# Patient Record
Sex: Female | Born: 1935 | Race: White | Hispanic: No | Marital: Married | Smoking: Never smoker
Health system: Southern US, Community
[De-identification: ages and names within clinical notes are randomized; demographics above are authoritative.]

## PROBLEM LIST (undated history)

## (undated) DIAGNOSIS — R0600 Dyspnea, unspecified: Secondary | ICD-10-CM

## (undated) DIAGNOSIS — M978XXA Periprosthetic fracture around other internal prosthetic joint, initial encounter: Secondary | ICD-10-CM

## (undated) DIAGNOSIS — Z950 Presence of cardiac pacemaker: Secondary | ICD-10-CM

## (undated) DIAGNOSIS — I351 Nonrheumatic aortic (valve) insufficiency: Secondary | ICD-10-CM

## (undated) DIAGNOSIS — S72141A Displaced intertrochanteric fracture of right femur, initial encounter for closed fracture: Secondary | ICD-10-CM

## (undated) DIAGNOSIS — E785 Hyperlipidemia, unspecified: Secondary | ICD-10-CM

## (undated) DIAGNOSIS — F419 Anxiety disorder, unspecified: Secondary | ICD-10-CM

## (undated) DIAGNOSIS — I059 Rheumatic mitral valve disease, unspecified: Secondary | ICD-10-CM

## (undated) DIAGNOSIS — I447 Left bundle-branch block, unspecified: Secondary | ICD-10-CM

## (undated) DIAGNOSIS — I2510 Atherosclerotic heart disease of native coronary artery without angina pectoris: Secondary | ICD-10-CM

## (undated) DIAGNOSIS — Y92009 Unspecified place in unspecified non-institutional (private) residence as the place of occurrence of the external cause: Secondary | ICD-10-CM

## (undated) DIAGNOSIS — F329 Major depressive disorder, single episode, unspecified: Secondary | ICD-10-CM

## (undated) DIAGNOSIS — I714 Abdominal aortic aneurysm, without rupture, unspecified: Secondary | ICD-10-CM

## (undated) DIAGNOSIS — I509 Heart failure, unspecified: Secondary | ICD-10-CM

## (undated) DIAGNOSIS — I219 Acute myocardial infarction, unspecified: Secondary | ICD-10-CM

## (undated) DIAGNOSIS — W19XXXA Unspecified fall, initial encounter: Secondary | ICD-10-CM

## (undated) DIAGNOSIS — F32A Depression, unspecified: Secondary | ICD-10-CM

## (undated) DIAGNOSIS — Z96659 Presence of unspecified artificial knee joint: Secondary | ICD-10-CM

## (undated) HISTORY — DX: Presence of cardiac pacemaker: Z95.0

## (undated) HISTORY — PX: ABDOMINAL AORTIC ANEURYSM REPAIR: SUR1152

## (undated) HISTORY — PX: OTHER SURGICAL HISTORY: SHX169

## (undated) HISTORY — DX: Heart failure, unspecified: I50.9

## (undated) HISTORY — DX: Depression, unspecified: F32.A

## (undated) HISTORY — DX: Nonrheumatic aortic (valve) insufficiency: I35.1

## (undated) HISTORY — DX: Rheumatic mitral valve disease, unspecified: I05.9

## (undated) HISTORY — DX: Atherosclerotic heart disease of native coronary artery without angina pectoris: I25.10

## (undated) HISTORY — PX: HERNIA REPAIR: SHX51

## (undated) HISTORY — DX: Acute myocardial infarction, unspecified: I21.9

## (undated) HISTORY — PX: JOINT REPLACEMENT: SHX530

## (undated) HISTORY — DX: Hyperlipidemia, unspecified: E78.5

## (undated) HISTORY — DX: Major depressive disorder, single episode, unspecified: F32.9

## (undated) HISTORY — DX: Left bundle-branch block, unspecified: I44.7

## (undated) HISTORY — DX: Unspecified place in unspecified non-institutional (private) residence as the place of occurrence of the external cause: Y92.009

## (undated) HISTORY — DX: Dyspnea, unspecified: R06.00

## (undated) HISTORY — DX: Unspecified fall, initial encounter: W19.XXXA

## (undated) HISTORY — DX: Anxiety disorder, unspecified: F41.9

---

## 2001-06-15 ENCOUNTER — Emergency Department (HOSPITAL_COMMUNITY): Admission: EM | Admit: 2001-06-15 | Discharge: 2001-06-15 | Payer: Self-pay | Admitting: Emergency Medicine

## 2001-06-21 ENCOUNTER — Emergency Department (HOSPITAL_COMMUNITY): Admission: EM | Admit: 2001-06-21 | Discharge: 2001-06-21 | Payer: Self-pay

## 2002-04-19 ENCOUNTER — Other Ambulatory Visit: Admission: RE | Admit: 2002-04-19 | Discharge: 2002-04-19 | Payer: Self-pay | Admitting: Internal Medicine

## 2002-05-05 ENCOUNTER — Encounter: Admission: RE | Admit: 2002-05-05 | Discharge: 2002-05-05 | Payer: Self-pay | Admitting: *Deleted

## 2002-05-05 ENCOUNTER — Encounter: Payer: Self-pay | Admitting: *Deleted

## 2002-09-09 ENCOUNTER — Emergency Department (HOSPITAL_COMMUNITY): Admission: EM | Admit: 2002-09-09 | Discharge: 2002-09-09 | Payer: Self-pay | Admitting: Emergency Medicine

## 2002-09-09 ENCOUNTER — Encounter: Payer: Self-pay | Admitting: Emergency Medicine

## 2002-09-11 ENCOUNTER — Inpatient Hospital Stay (HOSPITAL_COMMUNITY): Admission: EM | Admit: 2002-09-11 | Discharge: 2002-09-13 | Payer: Self-pay | Admitting: Emergency Medicine

## 2002-09-11 ENCOUNTER — Encounter: Payer: Self-pay | Admitting: Emergency Medicine

## 2002-09-12 ENCOUNTER — Encounter: Payer: Self-pay | Admitting: Geriatric Medicine

## 2002-12-30 ENCOUNTER — Ambulatory Visit (HOSPITAL_COMMUNITY): Admission: RE | Admit: 2002-12-30 | Discharge: 2002-12-30 | Payer: Self-pay | Admitting: Gastroenterology

## 2003-02-12 ENCOUNTER — Emergency Department (HOSPITAL_COMMUNITY): Admission: EM | Admit: 2003-02-12 | Discharge: 2003-02-12 | Payer: Self-pay | Admitting: Emergency Medicine

## 2003-02-12 ENCOUNTER — Encounter: Payer: Self-pay | Admitting: Emergency Medicine

## 2003-03-07 ENCOUNTER — Encounter: Payer: Self-pay | Admitting: Internal Medicine

## 2003-03-07 ENCOUNTER — Encounter: Admission: RE | Admit: 2003-03-07 | Discharge: 2003-03-07 | Payer: Self-pay | Admitting: Internal Medicine

## 2003-06-13 ENCOUNTER — Other Ambulatory Visit: Admission: RE | Admit: 2003-06-13 | Discharge: 2003-06-13 | Payer: Self-pay | Admitting: Internal Medicine

## 2003-07-21 ENCOUNTER — Encounter: Admission: RE | Admit: 2003-07-21 | Discharge: 2003-07-21 | Payer: Self-pay | Admitting: Internal Medicine

## 2003-08-07 ENCOUNTER — Other Ambulatory Visit: Admission: RE | Admit: 2003-08-07 | Discharge: 2003-08-07 | Payer: Self-pay | Admitting: Obstetrics and Gynecology

## 2004-01-10 ENCOUNTER — Other Ambulatory Visit: Admission: RE | Admit: 2004-01-10 | Discharge: 2004-01-10 | Payer: Self-pay | Admitting: Obstetrics and Gynecology

## 2004-07-01 ENCOUNTER — Other Ambulatory Visit: Admission: RE | Admit: 2004-07-01 | Discharge: 2004-07-01 | Payer: Self-pay | Admitting: Obstetrics and Gynecology

## 2004-07-22 ENCOUNTER — Encounter: Admission: RE | Admit: 2004-07-22 | Discharge: 2004-07-22 | Payer: Self-pay | Admitting: Internal Medicine

## 2004-08-25 HISTORY — PX: PACEMAKER INSERTION: SHX728

## 2005-07-03 ENCOUNTER — Other Ambulatory Visit: Admission: RE | Admit: 2005-07-03 | Discharge: 2005-07-03 | Payer: Self-pay | Admitting: Obstetrics and Gynecology

## 2005-07-21 ENCOUNTER — Encounter: Admission: RE | Admit: 2005-07-21 | Discharge: 2005-09-15 | Payer: Self-pay | Admitting: Orthopedic Surgery

## 2005-07-30 ENCOUNTER — Encounter: Admission: RE | Admit: 2005-07-30 | Discharge: 2005-07-30 | Payer: Self-pay | Admitting: Internal Medicine

## 2005-12-08 ENCOUNTER — Other Ambulatory Visit: Admission: RE | Admit: 2005-12-08 | Discharge: 2005-12-08 | Payer: Self-pay | Admitting: Obstetrics and Gynecology

## 2006-06-02 ENCOUNTER — Other Ambulatory Visit: Admission: RE | Admit: 2006-06-02 | Discharge: 2006-06-02 | Payer: Self-pay | Admitting: Obstetrics and Gynecology

## 2006-06-12 ENCOUNTER — Encounter: Admission: RE | Admit: 2006-06-12 | Discharge: 2006-06-12 | Payer: Self-pay | Admitting: Internal Medicine

## 2006-08-04 ENCOUNTER — Encounter: Admission: RE | Admit: 2006-08-04 | Discharge: 2006-08-04 | Payer: Self-pay | Admitting: Internal Medicine

## 2006-08-25 HISTORY — PX: REPLACEMENT TOTAL KNEE: SUR1224

## 2006-09-23 ENCOUNTER — Ambulatory Visit: Admission: RE | Admit: 2006-09-23 | Discharge: 2006-09-23 | Payer: Self-pay | Admitting: Cardiology

## 2006-09-23 ENCOUNTER — Encounter (INDEPENDENT_AMBULATORY_CARE_PROVIDER_SITE_OTHER): Payer: Self-pay | Admitting: Cardiology

## 2007-01-04 ENCOUNTER — Inpatient Hospital Stay (HOSPITAL_COMMUNITY): Admission: RE | Admit: 2007-01-04 | Discharge: 2007-01-07 | Payer: Self-pay | Admitting: Orthopedic Surgery

## 2007-01-04 ENCOUNTER — Ambulatory Visit: Payer: Self-pay | Admitting: Physical Medicine & Rehabilitation

## 2007-04-20 ENCOUNTER — Other Ambulatory Visit: Admission: RE | Admit: 2007-04-20 | Discharge: 2007-04-20 | Payer: Self-pay | Admitting: Obstetrics and Gynecology

## 2007-08-10 ENCOUNTER — Encounter: Admission: RE | Admit: 2007-08-10 | Discharge: 2007-08-10 | Payer: Self-pay | Admitting: Internal Medicine

## 2007-09-02 ENCOUNTER — Ambulatory Visit: Payer: Self-pay | Admitting: *Deleted

## 2008-03-30 ENCOUNTER — Ambulatory Visit: Payer: Self-pay | Admitting: *Deleted

## 2008-04-21 ENCOUNTER — Other Ambulatory Visit: Admission: RE | Admit: 2008-04-21 | Discharge: 2008-04-21 | Payer: Self-pay | Admitting: Obstetrics and Gynecology

## 2008-08-10 ENCOUNTER — Encounter: Admission: RE | Admit: 2008-08-10 | Discharge: 2008-08-10 | Payer: Self-pay | Admitting: Internal Medicine

## 2008-10-05 ENCOUNTER — Ambulatory Visit: Payer: Self-pay | Admitting: *Deleted

## 2009-04-05 ENCOUNTER — Emergency Department (HOSPITAL_COMMUNITY): Admission: EM | Admit: 2009-04-05 | Discharge: 2009-04-05 | Payer: Self-pay

## 2009-07-06 ENCOUNTER — Ambulatory Visit: Payer: Self-pay | Admitting: Vascular Surgery

## 2009-08-13 ENCOUNTER — Encounter: Admission: RE | Admit: 2009-08-13 | Discharge: 2009-08-13 | Payer: Self-pay | Admitting: Internal Medicine

## 2009-08-29 ENCOUNTER — Other Ambulatory Visit: Admission: RE | Admit: 2009-08-29 | Discharge: 2009-08-29 | Payer: Self-pay | Admitting: Obstetrics and Gynecology

## 2010-01-25 ENCOUNTER — Ambulatory Visit: Payer: Self-pay | Admitting: Vascular Surgery

## 2010-07-22 ENCOUNTER — Encounter: Admission: RE | Admit: 2010-07-22 | Discharge: 2010-07-22 | Payer: Self-pay | Admitting: Internal Medicine

## 2010-08-06 ENCOUNTER — Ambulatory Visit: Payer: Self-pay | Admitting: Vascular Surgery

## 2010-08-12 ENCOUNTER — Ambulatory Visit: Payer: Self-pay | Admitting: Vascular Surgery

## 2010-08-15 ENCOUNTER — Encounter
Admission: RE | Admit: 2010-08-15 | Discharge: 2010-08-15 | Payer: Self-pay | Source: Home / Self Care | Attending: Internal Medicine | Admitting: Internal Medicine

## 2010-08-25 HISTORY — PX: INSERT / REPLACE / REMOVE PACEMAKER: SUR710

## 2010-08-28 ENCOUNTER — Encounter
Admission: RE | Admit: 2010-08-28 | Discharge: 2010-08-28 | Payer: Self-pay | Source: Home / Self Care | Attending: Internal Medicine | Admitting: Internal Medicine

## 2010-09-13 ENCOUNTER — Emergency Department (HOSPITAL_COMMUNITY)
Admission: EM | Admit: 2010-09-13 | Discharge: 2010-09-14 | Payer: Self-pay | Source: Home / Self Care | Admitting: Emergency Medicine

## 2010-09-17 LAB — POCT I-STAT, CHEM 8
BUN: 26 mg/dL — ABNORMAL HIGH (ref 6–23)
Calcium, Ion: 1.13 mmol/L (ref 1.12–1.32)
Chloride: 106 mEq/L (ref 96–112)
Creatinine, Ser: 1 mg/dL (ref 0.4–1.2)
Glucose, Bld: 126 mg/dL — ABNORMAL HIGH (ref 70–99)
HCT: 34 % — ABNORMAL LOW (ref 36.0–46.0)
Hemoglobin: 11.6 g/dL — ABNORMAL LOW (ref 12.0–15.0)
Potassium: 3.9 mEq/L (ref 3.5–5.1)
Sodium: 140 mEq/L (ref 135–145)
TCO2: 24 mmol/L (ref 0–100)

## 2010-09-17 LAB — CBC
HCT: 34.2 % — ABNORMAL LOW (ref 36.0–46.0)
Hemoglobin: 11.2 g/dL — ABNORMAL LOW (ref 12.0–15.0)
MCH: 31.3 pg (ref 26.0–34.0)
MCHC: 32.7 g/dL (ref 30.0–36.0)
MCV: 95.5 fL (ref 78.0–100.0)
Platelets: 215 10*3/uL (ref 150–400)
RBC: 3.58 MIL/uL — ABNORMAL LOW (ref 3.87–5.11)
RDW: 14.6 % (ref 11.5–15.5)
WBC: 6.1 10*3/uL (ref 4.0–10.5)

## 2010-09-17 LAB — DIFFERENTIAL
Basophils Absolute: 0 10*3/uL (ref 0.0–0.1)
Basophils Relative: 0 % (ref 0–1)
Eosinophils Absolute: 0 10*3/uL (ref 0.0–0.7)
Eosinophils Relative: 0 % (ref 0–5)
Lymphocytes Relative: 15 % (ref 12–46)
Lymphs Abs: 0.9 10*3/uL (ref 0.7–4.0)
Monocytes Absolute: 0.7 10*3/uL (ref 0.1–1.0)
Monocytes Relative: 12 % (ref 3–12)
Neutro Abs: 4.4 10*3/uL (ref 1.7–7.7)
Neutrophils Relative %: 73 % (ref 43–77)

## 2010-09-17 LAB — LIPASE, BLOOD: Lipase: 14 U/L (ref 11–59)

## 2010-09-17 LAB — HEPATIC FUNCTION PANEL
ALT: 23 U/L (ref 0–35)
AST: 27 U/L (ref 0–37)
Albumin: 2.9 g/dL — ABNORMAL LOW (ref 3.5–5.2)
Alkaline Phosphatase: 49 U/L (ref 39–117)
Bilirubin, Direct: 0.2 mg/dL (ref 0.0–0.3)
Indirect Bilirubin: 0.7 mg/dL (ref 0.3–0.9)
Total Bilirubin: 0.9 mg/dL (ref 0.3–1.2)
Total Protein: 4.8 g/dL — ABNORMAL LOW (ref 6.0–8.3)

## 2010-09-20 ENCOUNTER — Encounter: Payer: Self-pay | Admitting: Internal Medicine

## 2010-09-23 ENCOUNTER — Encounter: Payer: Self-pay | Admitting: Internal Medicine

## 2010-09-25 ENCOUNTER — Encounter: Payer: Self-pay | Admitting: Internal Medicine

## 2010-10-02 ENCOUNTER — Institutional Professional Consult (permissible substitution): Payer: Self-pay | Admitting: Internal Medicine

## 2010-10-02 NOTE — Letter (Signed)
Summary: [name]'s Office Visit Note   [name]'s Office Visit Note   Imported By: Roderic Ovens 09/20/2010 16:38:46  _____________________________________________________________________  External Attachment:    Type:   Image     Comment:   External Document

## 2010-10-05 ENCOUNTER — Emergency Department (HOSPITAL_COMMUNITY): Payer: Medicare Other

## 2010-10-05 ENCOUNTER — Inpatient Hospital Stay (HOSPITAL_COMMUNITY)
Admission: EM | Admit: 2010-10-05 | Discharge: 2010-10-08 | DRG: 690 | Disposition: A | Discharge status: Home Health | Payer: Medicare Other | Attending: Internal Medicine | Admitting: Internal Medicine

## 2010-10-05 DIAGNOSIS — E785 Hyperlipidemia, unspecified: Secondary | ICD-10-CM | POA: Diagnosis present

## 2010-10-05 DIAGNOSIS — E860 Dehydration: Secondary | ICD-10-CM | POA: Diagnosis present

## 2010-10-05 DIAGNOSIS — E876 Hypokalemia: Secondary | ICD-10-CM | POA: Diagnosis present

## 2010-10-05 DIAGNOSIS — Z79899 Other long term (current) drug therapy: Secondary | ICD-10-CM

## 2010-10-05 DIAGNOSIS — N390 Urinary tract infection, site not specified: Principal | ICD-10-CM | POA: Diagnosis present

## 2010-10-05 DIAGNOSIS — Z950 Presence of cardiac pacemaker: Secondary | ICD-10-CM

## 2010-10-05 DIAGNOSIS — K219 Gastro-esophageal reflux disease without esophagitis: Secondary | ICD-10-CM | POA: Diagnosis present

## 2010-10-05 DIAGNOSIS — B9689 Other specified bacterial agents as the cause of diseases classified elsewhere: Secondary | ICD-10-CM | POA: Diagnosis present

## 2010-10-05 DIAGNOSIS — I509 Heart failure, unspecified: Secondary | ICD-10-CM | POA: Diagnosis present

## 2010-10-05 DIAGNOSIS — K4090 Unilateral inguinal hernia, without obstruction or gangrene, not specified as recurrent: Secondary | ICD-10-CM | POA: Diagnosis present

## 2010-10-05 DIAGNOSIS — I10 Essential (primary) hypertension: Secondary | ICD-10-CM | POA: Diagnosis present

## 2010-10-05 DIAGNOSIS — Z7982 Long term (current) use of aspirin: Secondary | ICD-10-CM

## 2010-10-05 DIAGNOSIS — R32 Unspecified urinary incontinence: Secondary | ICD-10-CM | POA: Diagnosis present

## 2010-10-05 DIAGNOSIS — M1990 Unspecified osteoarthritis, unspecified site: Secondary | ICD-10-CM | POA: Diagnosis present

## 2010-10-05 DIAGNOSIS — I2510 Atherosclerotic heart disease of native coronary artery without angina pectoris: Secondary | ICD-10-CM | POA: Diagnosis present

## 2010-10-05 LAB — DIFFERENTIAL
Basophils Absolute: 0 10*3/uL (ref 0.0–0.1)
Basophils Relative: 0 % (ref 0–1)
Eosinophils Relative: 1 % (ref 0–5)
Monocytes Absolute: 0.8 10*3/uL (ref 0.1–1.0)
Neutro Abs: 4.7 10*3/uL (ref 1.7–7.7)

## 2010-10-05 LAB — CBC
MCHC: 32.3 g/dL (ref 30.0–36.0)
RDW: 14.8 % (ref 11.5–15.5)
WBC: 7.2 10*3/uL (ref 4.0–10.5)

## 2010-10-05 LAB — COMPREHENSIVE METABOLIC PANEL
ALT: 14 U/L (ref 0–35)
AST: 23 U/L (ref 0–37)
Albumin: 3.2 g/dL — ABNORMAL LOW (ref 3.5–5.2)
Calcium: 8.7 mg/dL (ref 8.4–10.5)
GFR calc Af Amer: >60 mL/min (ref >60)
Potassium: 3.4 mEq/L — ABNORMAL LOW (ref 3.5–5.1)
Sodium: 137 mEq/L (ref 135–145)
Total Protein: 5.5 g/dL — ABNORMAL LOW (ref 6.0–8.3)

## 2010-10-05 LAB — GLUCOSE, CAPILLARY: Glucose-Capillary: 101 mg/dL — ABNORMAL HIGH (ref 70–99)

## 2010-10-06 ENCOUNTER — Inpatient Hospital Stay (HOSPITAL_COMMUNITY): Payer: Medicare Other

## 2010-10-06 LAB — URINALYSIS, ROUTINE W REFLEX MICROSCOPIC
Protein, ur: NEGATIVE mg/dL
Specific Gravity, Urine: 1.011 (ref 1.005–1.030)
Urine Glucose, Fasting: NEGATIVE mg/dL
pH: 7 (ref 5.0–8.0)

## 2010-10-06 LAB — CARDIAC PANEL(CRET KIN+CKTOT+MB+TROPI): Relative Index: INVALID (ref 0.0–2.5)

## 2010-10-06 LAB — BASIC METABOLIC PANEL
CO2: 27 mEq/L (ref 19–32)
Calcium: 8.7 mg/dL (ref 8.4–10.5)
Creatinine, Ser: 0.92 mg/dL (ref 0.4–1.2)
GFR calc Af Amer: >60 mL/min (ref >60)

## 2010-10-06 LAB — URINE MICROSCOPIC-ADD ON

## 2010-10-06 LAB — LIPID PANEL
LDL Cholesterol: 48 mg/dL (ref 0–99)
Total CHOL/HDL Ratio: 2.6 RATIO
VLDL: 12 mg/dL (ref 0–40)

## 2010-10-08 LAB — URINE CULTURE

## 2010-10-09 ENCOUNTER — Institutional Professional Consult (permissible substitution) (INDEPENDENT_AMBULATORY_CARE_PROVIDER_SITE_OTHER): Payer: Medicare Other | Admitting: Internal Medicine

## 2010-10-09 ENCOUNTER — Other Ambulatory Visit: Payer: Self-pay | Admitting: Internal Medicine

## 2010-10-09 ENCOUNTER — Encounter: Payer: Self-pay | Admitting: Internal Medicine

## 2010-10-09 DIAGNOSIS — I495 Sick sinus syndrome: Secondary | ICD-10-CM | POA: Insufficient documentation

## 2010-10-09 DIAGNOSIS — I509 Heart failure, unspecified: Secondary | ICD-10-CM

## 2010-10-09 DIAGNOSIS — I5022 Chronic systolic (congestive) heart failure: Secondary | ICD-10-CM

## 2010-10-10 ENCOUNTER — Other Ambulatory Visit: Payer: Self-pay | Admitting: Internal Medicine

## 2010-10-10 LAB — CBC WITH DIFFERENTIAL/PLATELET
Basophils Absolute: 0 10*3/uL (ref 0.0–0.1)
Basophils Relative: 0.6 % (ref 0.0–3.0)
Eosinophils Absolute: 0.1 10*3/uL (ref 0.0–0.7)
Eosinophils Relative: 2.1 % (ref 0.0–5.0)
HCT: 34.5 % — ABNORMAL LOW (ref 36.0–46.0)
Hemoglobin: 11.5 g/dL — ABNORMAL LOW (ref 12.0–15.0)
Lymphocytes Relative: 19.6 % (ref 12.0–46.0)
Lymphs Abs: 1.3 10*3/uL (ref 0.7–4.0)
MCHC: 33.3 g/dL (ref 30.0–36.0)
MCV: 96.5 fl (ref 78.0–100.0)
Monocytes Absolute: 0.7 10*3/uL (ref 0.1–1.0)
Monocytes Relative: 10 % (ref 3.0–12.0)
Neutro Abs: 4.6 10*3/uL (ref 1.4–7.7)
Neutrophils Relative %: 67.7 % (ref 43.0–77.0)
Platelets: 237 10*3/uL (ref 150.0–400.0)
RBC: 3.58 Mil/uL — ABNORMAL LOW (ref 3.87–5.11)
RDW: 15.2 % — ABNORMAL HIGH (ref 11.5–14.6)
WBC: 6.8 10*3/uL (ref 4.5–10.5)

## 2010-10-10 LAB — BASIC METABOLIC PANEL
Chloride: 105 mEq/L (ref 96–112)
Potassium: 4.4 mEq/L (ref 3.5–5.1)

## 2010-10-10 LAB — PROTIME-INR
INR: 1.1 ratio — ABNORMAL HIGH (ref 0.8–1.0)
Prothrombin Time: 12.2 s (ref 10.2–12.4)

## 2010-10-10 LAB — APTT: aPTT: 26.1 s (ref 21.7–28.8)

## 2010-10-13 NOTE — Discharge Summary (Signed)
[name], [name] NO.:  [id number]  MEDICAL RECORD NO.:  [id number]           PATIENT TYPE:  I  LOCATION:  3009                         FACILITY:  [facility]  PHYSICIAN:  Georgann Housekeeper, MD      DATE OF BIRTH:  08/15/1920  DATE OF ADMISSION:  10/05/2010 DATE OF DISCHARGE:  10/08/2010                              DISCHARGE SUMMARY   DISCHARGE DIAGNOSES: 1. Change in mental status confusion. 2. Urinary tract infection. 3. History of congestive heart failure. 4. History of pacemaker. 5. History of hypertension. 6. Mild hypokalemia. 7. Dehydration due to diarrhea preceding admission.  MEDICATIONS ON DISCHARGE: 1. Coreg 6.25 mg b.i.d. 2. Losartan 50 mg half a tablet daily. 3. Simvastatin 20 mg daily. 4. Omeprazole 20 mg daily. 5. Furosemide 20 mg b.i.d. 6. Lutein 1 tablet daily. 7. Oxybutynin 10 mg daily. 8. Vitamin D 1000 units daily. 9. Glucosamine 1 capsule daily. 10.Aspirin 81 mg daily. 11.Multivitamin 1 tablet daily. 12.Cipro 250 mg b.i.d. for 7 days.  ALLERGIES:  None.  The CT of the head negative for acute changes.  Chest x-ray negative. Cardiac markers negative.  Sodium 141, potassium 3.3.  White count of 7.2, hemoglobin was 12.  UA was positive.  Culture showed Gram-negative rods greater than 100,000.  TSH normal.  HOSPITAL COURSE:  An 77 year old female admitted with episode of confusion and mild decreased blood pressure. 1. Change in mental status and confusion, was found to have UTI.  Her     CT scan was negative.  No evidence of any acute neurological     finding on exam.  She was started on IV Rocephin and IV fluids.     Culture grew out Gram-negative rods, antibiotics switched to Cipro,     continue on 7 days with fluids.  Her mental status cleared up and     she would continue with some home health as recommended by Physical     Therapy. 2. History of CHF and pacemaker.  She was supposed to have the     pacemaker wire changed,  supposed to see Dr. Ladona Ridgel this week.     Because of the UTI infection, I will postpone the appointment to     next week.  As far as the history of mild hypotension, her Lasix     dose was increased outpatient to 40 mg twice a day.  There was no     evidence of any swelling and blood pressure being on the lower side     to 120s.  Lasix will be decreased back to 20 mg b.i.d., continue on     Coreg and losartan.  Blood pressure at the time of discharge is     systolic 120s.  Mild hypokalemia due to the diarrhea proceeding and     the use of diuretics which will be cut down to the previous dose.     Physical Therapy was consulted and recommended Home Health.  I will     see her back in the office next week, repeat the urinalysis, and I     will get Dr. Ladona Ridgel to reschedule the appointment  for the next     week.     Georgann Housekeeper, MD     KH/MEDQ  D:  10/08/2010  T:  10/08/2010  Job:  [id number]  Electronically Signed by Georgann Housekeeper MD on 10/13/2010 10:14:41 AM

## 2010-10-16 NOTE — Letter (Signed)
Summary: Implantable Device Instructions  Architectural technologist, Main Office  [address]   [place], [place] [address]   Phone: [phone]  Fax: [phone]      Implantable Device Instructions  You are scheduled for:   _____ Generator Change with addition of LV lead  on 10/18/10  with Dr. Ladona Ridgel.  1.  Please arrive at the Short Stay Center at [facility] at 7:30am on the day of your procedure.  2.  Do not eat or drink after midnight the night before your procedure.  3.  Complete lab work on 10/09/13.  .  You do not have to be fasting.  4.You may take all your medications the morning of procedure with a sip of water  5.  Plan for an overnight stay.  Bring your insurance cards and a list of your medications.  6.  Wash your chest and neck with antibacterial soap (any brand) the evening before and the morning of your procedure.  Rinse well.   *If you have ANY questions after you get home, please call the office [phone].  [name]  *Every attempt is made to prevent procedures from being rescheduled.  Due to the nauture of Electrophysiology, rescheduling can happen.  The physician is always aware and directs the staff when this occurs.

## 2010-10-16 NOTE — Assessment & Plan Note (Signed)
Summary: nep/pt has a medtronic/eval for elec placment/up grade ofc 27...   Primary Provider:  Dr Donette Larry   History of Present Illness: [name] is referred today by Dr. Donnie Aho for evaluation and management of her PPM which is at [facility] and for CHF. She has a h/o CHB and is s/p PPM. She has been at [facility] for several months and has had worsening CHF symptoms. She has had to have uptitration of her diuretic dosing. She denies syncope. She has recently been treated for a UTI.  Current Medications (verified): 1)  Prilosec 20 Mg Cpdr (Omeprazole) .... Once Daily 2)  Aspirin 81 Mg Tbec (Aspirin) .... Take One Tablet By Mouth Daily 3)  Glucosamine 500 Mg Caps (Glucosamine Sulfate) .... 3ooo Mg Daily 4)  Multivitamins  Tabs (Multiple Vitamin) .... Once Daily 5)  Simvastatin 20 Mg Tabs (Simvastatin) .... Take One Tablet By Mouth Daily At Bedtime 6)  Tylenol Extra Strength 500 Mg Tabs (Acetaminophen) .... As Needed 7)  Lutein 20 Mg Caps (Lutein) .... Once Daily 8)  Cozaar 50 Mg Tabs (Losartan Potassium) .... 1/2 Tab Once Daily 9)  Vitamin D3 5000 Unit Caps (Cholecalciferol) .... Weekly 10)  Oxybutynin Chloride 10 Mg Xr24h-Tab (Oxybutynin Chloride) .... Once Daily 11)  Furosemide 20 Mg Tabs (Furosemide) .... Take One Tablet Two Times A Day 12)  Coreg 6.25 Mg Tabs (Carvedilol) .... Two Times A Day 13)  Cipro 250 Mg Tabs (Ciprofloxacin Hcl) .... Two Times A Day  Allergies (verified): 1)  ! Ace Inhibitors 2)  ! Lipitor 3)  ! All Mind Altering Drugs  Past History:  Past Medical History: Last updated: 10/09/2010 Aneruysm-Abdominal Aortic Valve-Regurgitation Congenital Heart Disease Left Bundle Branch Block Hyperlipidemia Mitral Valve Disorder Pacemaker Dyspnea Anxiety Depression  Past Surgical History: Last updated: 10/09/2010 Knee Arthroscopy  Social History: Last updated: 10/09/2010 Retired  Married  Tobacco Use - No.  Alcohol Use - yes Regular Exercise - yes Drug Use -  no  Review of Systems       The patient complains of dyspnea on exertion and peripheral edema.  The patient denies chest pain and syncope.    Vital Signs:  Patient profile:   77 year old female Height:      59 inches Weight:      114 pounds BMI:     23.11 Pulse rate:   65 / minute BP sitting:   112 / 50  (right arm) Cuff size:   regular  Vitals Entered By: Hardin Negus, RMA (October 09, 2010 2:55 PM)  Physical Exam  General:  Well developed, well nourished, in no acute distress.  HEENT: normal Neck: supple. No JVD. Carotids 2+ bilaterally no bruits Cor: RRR with a soft systolic murmur at the RUSB. Lungs: CTA Ab: soft, nontender. nondistended. No HSM. Good bowel sounds Ext: warm. no cyanosis, clubbing or edema Neuro: alert and oriented. Grossly nonfocal. affect pleasant    MD Comments:  Device is a ERI with VVI pacing.  Impression & Recommendations:  Problem # 1:  CHRONIC SYSTOLIC HEART FAILURE (ICD-428.22) I have discussed the treatment options with the patient. I have recommended PPM generator change. I will try to uptitrate her to a BiV PPM. Her updated medication list for this problem includes:    Aspirin 81 Mg Tbec (Aspirin) .Marland Kitchen... Take one tablet by mouth daily    Cozaar 50 Mg Tabs (Losartan potassium) .Marland Kitchen... 1/2 tab once daily    Furosemide 20 Mg Tabs (Furosemide) .Marland Kitchen... Take one tablet two times  a day    Coreg 6.25 Mg Tabs (Carvedilol) .Marland Kitchen..Marland Kitchen Two times a day  Orders: TLB-BMP (Basic Metabolic Panel-BMET) (80048-METABOL) TLB-CBC Platelet - w/Differential (85025-CBCD) TLB-PT (Protime) (85610-PTP) TLB-PTT (85730-PTTL) Pacer (Pacer)  Problem # 2:  CARDIAC PACEMAKER IN SITU (ICD-V45.01) Her device is at ERI. Will schedule generator change.  Patient Instructions: 1)  Your physician has recommended that you have a pacemaker generator change with LV lead.  A pacemaker is a small device that is placed under the skin of your chest or abdomen to help control abnormal  heart rhythms. This device uses electrical pulses to prompt the heart to beat at a normal rate. Pacemakers are used to treat heart rhythms that are too slow. Wires (leads) are attached to the pacemaker that goes into the chambers of your heart. This is done in the hospital and usually requires an overnight stay. Please see the instruction sheet given to you today for more information.

## 2010-10-18 ENCOUNTER — Observation Stay (HOSPITAL_COMMUNITY)
Admission: RE | Admit: 2010-10-18 | Discharge: 2010-10-19 | Disposition: A | Discharge status: Home / Self Care | Payer: Medicare Other | Source: Ambulatory Visit | Attending: Internal Medicine | Admitting: Internal Medicine

## 2010-10-18 DIAGNOSIS — R0989 Other specified symptoms and signs involving the circulatory and respiratory systems: Secondary | ICD-10-CM | POA: Insufficient documentation

## 2010-10-18 DIAGNOSIS — I447 Left bundle-branch block, unspecified: Secondary | ICD-10-CM | POA: Insufficient documentation

## 2010-10-18 DIAGNOSIS — R0609 Other forms of dyspnea: Secondary | ICD-10-CM | POA: Insufficient documentation

## 2010-10-18 DIAGNOSIS — F411 Generalized anxiety disorder: Secondary | ICD-10-CM | POA: Insufficient documentation

## 2010-10-18 DIAGNOSIS — I5022 Chronic systolic (congestive) heart failure: Secondary | ICD-10-CM

## 2010-10-18 DIAGNOSIS — F329 Major depressive disorder, single episode, unspecified: Secondary | ICD-10-CM | POA: Insufficient documentation

## 2010-10-18 DIAGNOSIS — I080 Rheumatic disorders of both mitral and aortic valves: Secondary | ICD-10-CM | POA: Insufficient documentation

## 2010-10-18 DIAGNOSIS — F3289 Other specified depressive episodes: Secondary | ICD-10-CM | POA: Insufficient documentation

## 2010-10-18 DIAGNOSIS — E785 Hyperlipidemia, unspecified: Secondary | ICD-10-CM | POA: Insufficient documentation

## 2010-10-18 DIAGNOSIS — I442 Atrioventricular block, complete: Secondary | ICD-10-CM

## 2010-10-18 DIAGNOSIS — Z950 Presence of cardiac pacemaker: Secondary | ICD-10-CM | POA: Insufficient documentation

## 2010-10-18 DIAGNOSIS — I714 Abdominal aortic aneurysm, without rupture, unspecified: Secondary | ICD-10-CM | POA: Insufficient documentation

## 2010-10-18 DIAGNOSIS — Z45018 Encounter for adjustment and management of other part of cardiac pacemaker: Principal | ICD-10-CM | POA: Insufficient documentation

## 2010-10-18 DIAGNOSIS — Z7982 Long term (current) use of aspirin: Secondary | ICD-10-CM | POA: Insufficient documentation

## 2010-10-18 LAB — SURGICAL PCR SCREEN: MRSA, PCR: NEGATIVE

## 2010-10-19 ENCOUNTER — Observation Stay (HOSPITAL_COMMUNITY): Payer: Medicare Other

## 2010-10-22 NOTE — Cardiovascular Report (Signed)
Summary: Office Visit   Office Visit   Imported By: Roderic Ovens 10/16/2010 15:55:17  _____________________________________________________________________  External Attachment:    Type:   Image     Comment:   External Document

## 2010-10-28 ENCOUNTER — Other Ambulatory Visit: Payer: Self-pay | Admitting: Internal Medicine

## 2010-10-28 ENCOUNTER — Encounter: Payer: Self-pay | Admitting: Internal Medicine

## 2010-10-28 ENCOUNTER — Ambulatory Visit (INDEPENDENT_AMBULATORY_CARE_PROVIDER_SITE_OTHER): Payer: Medicare Other

## 2010-10-28 ENCOUNTER — Other Ambulatory Visit (INDEPENDENT_AMBULATORY_CARE_PROVIDER_SITE_OTHER): Payer: Medicare Other

## 2010-10-28 DIAGNOSIS — I5022 Chronic systolic (congestive) heart failure: Secondary | ICD-10-CM | POA: Insufficient documentation

## 2010-10-28 DIAGNOSIS — I509 Heart failure, unspecified: Secondary | ICD-10-CM

## 2010-10-28 DIAGNOSIS — I428 Other cardiomyopathies: Secondary | ICD-10-CM

## 2010-10-29 LAB — BASIC METABOLIC PANEL
CO2: 30 mEq/L (ref 19–32)
Calcium: 9.2 mg/dL (ref 8.4–10.5)
Chloride: 104 mEq/L (ref 96–112)
Sodium: 140 mEq/L (ref 135–145)

## 2010-10-31 NOTE — Cardiovascular Report (Signed)
Summary: Pre-Op Orders  Pre-Op Orders   Imported By: Marylou Mccoy 10/24/2010 16:13:45  _____________________________________________________________________  External Attachment:    Type:   Image     Comment:   External Document

## 2010-11-05 NOTE — Letter (Signed)
Summary: Dr. Leeroy Bock Tilley's Letter  Dr. Leeroy Bock Tilley's Letter   Imported By: Marylou Mccoy 10/29/2010 10:15:41  _____________________________________________________________________  External Attachment:    Type:   Image     Comment:   External Document

## 2010-11-05 NOTE — Letter (Signed)
Summary: Self-Recorded Hx  Self-Recorded Hx   Imported By: Marylou Mccoy 10/29/2010 10:22:16  _____________________________________________________________________  External Attachment:    Type:   Image     Comment:   External Document

## 2010-11-05 NOTE — Cardiovascular Report (Signed)
Summary: Office Visit    Office Visit   Imported By: Roderic Ovens 10/29/2010 16:18:18  _____________________________________________________________________  External Attachment:    Type:   Image     Comment:   External Document

## 2010-11-05 NOTE — Procedures (Signed)
Summary: 7-10 day wound ck/mt   Current Medications (verified): 1)  Prilosec 20 Mg Cpdr (Omeprazole) .... Once Daily 2)  Aspirin 81 Mg Tbec (Aspirin) .... Take One Tablet By Mouth Daily 3)  Glucosamine 500 Mg Caps (Glucosamine Sulfate) .... 3ooo Mg Daily 4)  Multivitamins  Tabs (Multiple Vitamin) .... Once Daily 5)  Simvastatin 20 Mg Tabs (Simvastatin) .... Take One Tablet By Mouth Daily At Bedtime 6)  Tylenol Extra Strength 500 Mg Tabs (Acetaminophen) .... As Needed 7)  Lutein 20 Mg Caps (Lutein) .... Once Daily 8)  Cozaar 50 Mg Tabs (Losartan Potassium) .... 1/2 Tab Once Daily 9)  Oxybutynin Chloride 10 Mg Xr24h-Tab (Oxybutynin Chloride) .... Once Daily 10)  Furosemide 20 Mg Tabs (Furosemide) .... Take One Tablet Two Times A Day 11)  Coreg 6.25 Mg Tabs (Carvedilol) .... Two Times A Day 12)  Vitamin D 1000 Unit Tabs (Cholecalciferol) .... One By Mouth Daily  Allergies (verified): 1)  ! Ace Inhibitors 2)  ! Lipitor 3)  ! All Mind Altering Drugs  PPM Specifications Following MD:  Lewayne Bunting, MD     Referring MD:  Link Snuffer PPM Vendor:  Medtronic     PPM Model Number:  [id number]     PPM Serial Number:  [id number] PPM DOI:  10/18/2010     PPM Implanting MD:  Lewayne Bunting, MD  Lead 1    Location: RA     DOI: 05/06/2005     Model #: [id number]     Serial #: [id number]     Status: active Lead 2    Location: RV     DOI: 05/06/2005     Model #: [id number]     Serial #: [id number]     Status: active Lead 3    Location: LV     DOI: 10/18/2010     Model #: [id number]     Serial #: [id number]     Status: active  Magnet Response Rate:  BOL 85 ERI 65   Tech Comments:  See PaceArt

## 2010-11-05 NOTE — Letter (Signed)
Summary: Dr. Leeroy Bock Tilley's Office  Dr. Leeroy Bock Tilley's Office   Imported By: Marylou Mccoy 10/29/2010 10:10:47  _____________________________________________________________________  External Attachment:    Type:   Image     Comment:   External Document

## 2010-11-06 NOTE — Op Note (Signed)
[name], [name] NO.:  [id number]  MEDICAL RECORD NO.:  [id number]           PATIENT TYPE:  I  LOCATION:  3736                         FACILITY:  [facility]  PHYSICIAN:  Doylene Canning. Ladona Ridgel, MD    DATE OF BIRTH:  [birth year]  DATE OF PROCEDURE:  10/18/2010 DATE OF DISCHARGE:                              OPERATIVE REPORT   PROCEDURE PERFORMED:  Removal of previously implanted dual-chamber pacemaker which had reached elective replacement indication and reverted back to VVI pacing followed by insertion of a new left ventricular pacing lead utilizing coronary sinus venography and with upgrade to a BiV pacemaker with pacemaker pocket revision.  INTRODUCTION:  The patient is an 77 year old woman with a history of complete heart block status post pacemaker insertion.  She has chronic systolic congestive heart failure with an EF of 25% and pacing-induced left bundle-branch block with a QRS duration of 180 milliseconds.  Thepatient is now referred for removal of previously implanted pacemaker which is at [facility] and insertion of a new BiV device.  PROCEDURE:  After informed consent was obtained, the patient was taken to the Diagnostic EP Lab in a fasting state.  After usual preparation and draping, intravenous fentanyl and midazolam was given for sedation. 30 mL of lidocaine was infiltrated into the left infraclavicular region. A 5-cm incision was carried out over this region and electrocautery was utilized to dissect down to the fascial plane.  Care was taken not to enter the pacemaker pocket.  The left subclavian vein was punctured and initial attempts to advance the J-wire across the highly stenotic subclavian vein were unsuccessful.  The vein demonstrated to be subtotally occluded, but utilizing a Glidewire the subtotal occlusion was traversed and the guidewire was advanced into the right atrium.  A 9- French sheath was advanced over the guidewire.  A Medtronic  guiding catheter (extended hook) was advanced along with a 6-French hexapolar EP catheter into the right atrium.  With some degree of difficulty, the coronary sinus was cannulated.  Venography of the coronary sinus was carried out which demonstrated one satisfactory vein which was a lateral vein.  This vein was notable that it had a shepherd's crook.  The multiple attempts to place an LV lead were successfully carried out in this vein.  Unfortunately, the LV pacing lead was often ejected back into the coronary sinus.  However, on the final attempt, a successful location for the Medtronic LV lead was found with the location approximately one-third from base to apex.  At this location, the LV threshold was around 2 volts at 0.5 milliseconds in the LV tip to ring configuration and the pace impedance was around 700 ohms.  A 10-volt pacing did not stimulate the diaphragm.  The guiding catheters and sheaths were liberated from the LV pacing lead in the usual manner without difficulty.  The lead was secured to the subpectoralis fascia with a figure-of-eight silk suture.  The sewing sleeve was secured with silk suture.  At this point, the pocket was irrigated with antibiotic irrigation and electrocautery was utilized to enter the previously implanted dual-chamber pacemaker pocket.  The old pacing leads were  disconnected from the old Medtronic dual-chamber pacemaker can and the new Medtronic CRTR01 BiV pacemaker, serial number [id number] was connected to the right atrium, right ventricular, and new left ventricular pacing leads.  The pocket was revised to accommodate the larger pacemaker footprint.  Electrocautery was utilized to assure hemostasis.  The device was placed back in the subcutaneous pocket.  The leads were secured down with silk suture.  Additional irrigation was then utilized to irrigate the pocket and the incision was closed with 2- 0 and 3-0 Vicryl.  Benzoin and Steri-Strips were  painted on the wounds and pressure dressing was applied and the patient was returned to her room in satisfactory condition.  COMPLICATIONS:  There were no immediate procedure complications.  RESULTS:  This demonstrate successful removal of previously implanted Medtronic dual-chamber pacemaker followed by successful insertion of a new LV pacing lead utilizing coronary sinus venography and successful upgrade to a biventricular pacemaker with pacemaker pocket revision.     Doylene Canning. Ladona Ridgel, MD     GWT/MEDQ  D:  10/18/2010  T:  10/19/2010  Job:  [id number]  cc:   Georga Hacking, M.D.  Electronically Signed by Lewayne Bunting MD on 11/06/2010 04:37:23 PM

## 2010-11-09 NOTE — H&P (Signed)
[name], [name] NO.:  [id number]  MEDICAL RECORD NO.:  [id number]           PATIENT TYPE:  I  LOCATION:  3009                         FACILITY:  [facility]  PHYSICIAN:  Clayborn Bigness, MD, MPH  DATE OF BIRTH:  09/28/1919  DATE OF ADMISSION:  10/05/2010 DATE OF DISCHARGE:                             HISTORY & PHYSICAL   CHIEF COMPLAINT:  Confusion and altered mental status.  HISTORY OF PRESENT ILLNESS:  This is an 77 year old female who presents after an episode of confusion and altered mental status today.  She states on Friday night, she had multiple episodes of what she describes as dysentery or very loose watery bowel movements approximately every 3- 4 hours and earlier today, she was at home getting ready to give medications for her husband when she suddenly became very dizzy and she fell.  She denies any loss of consciousness and she did not have any injuries as the results of the fall after which she was able to get herself to the bed and then got up and she subsequently got dressed and was feeling okay.  However; she started to look at some mail and realized that she was not very lucid or coherent.  At that time, her son called a family friend who is a retired Engineer, civil (consulting) who came over and evaluated her.  Her friend, [name], is present during my evaluation tonight and states that at the time that she saw the patient, the patient was conscious.  There was no evidence of any trauma and that she was conversant, however, it was clear that she was not very lucid.  She was having trouble articulating exactly what she wanted to say.  There was no focal deficits at that time.  The patient denies any headache, nausea, vomiting, or weakness.  She had no fevers or chills and she was aware that something was not right with her.  Since this occurred earlier today, she states that her mental status has improved, however, she is not back at her baseline.  PAST MEDICAL  HISTORY:  Significant for coronary artery disease.  She also has a pacemaker, history of CHF, hypertension, gastroesophageal reflux disease, dyslipidemia, and osteoarthritis as well as an inguinal hernia which she is currently undergoing workup in preparation for cardiac clearance for the hernia repair.  FAMILY HISTORY:  Noncontributory.  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  She is a nonsmoker and nondrinker.  ALLERGIES:  She has no known drug allergies.  HOME MEDICATIONS: 1. Aspirin 500 mg q.4 p.r.n. 2. Carvedilol 6.25 b.i.d. 3. Furosemide 40 mg b.i.d., now this was recently increased to 40 mg     due to bilateral lower extremity swelling. 4. Losartan 50 mg daily. 5. Lutein 200 mg daily. 6. Omeprazole 20 mg daily. 7. Oxybutynin 10 mg daily. 8. Simvastatin 20 mg daily. 9. Vitamin D3 1000 units daily. 10.Multivitamin daily.  PHYSICAL EXAMINATION:  VITAL SIGNS:  In the ED; her temperature was 98.1, pulse was 71, respiratory rate was 20, blood pressure was 125/53, and O2 sat was 95% on room air.  GENERAL:  She was an elderly female who is lying comfortably in a stretcher with her friend, [name], at her side.  She did not appear to be in any acute distress. HEENT:  Her pupils were equally round and reactive to light and accommodation.  Extraocular membranes were intact.  Oropharynx was clear with moist mucous membranes. NECK:  Supple with no lymphadenopathy and no elevated JVD. CARDIOVASCULAR:  Regular rate and rhythm.  No murmurs, rubs, or gallops. PULMONARY:  Clear to auscultation bilaterally.  No wheezing, rales, or rhonchi. ABDOMEN:  Soft, nontender, and nondistended.  Bowel sounds were positive and there were no masses felt. EXTREMITIES:  She did have 2+ lower extremity edema bilaterally, which has improved significantly.  Her friend, [name], states that she did have about 3+ and very weepy leg a few days  ago. NEUROLOGIC:  Mental status, she was able to correctly state the year. She was unable to name the president.  She names the location of the hospital, but could not articulate which hospital.  She did not know which city she was in.  She was able to identify that she was in [place].  She did not know the street where she lived.  She could correctly state the month and day of her birthday, but did not know the year and did not know her age.  She had no focal areas of weakness.  No pronator drift.  She did not have any speech difficulties.  She did not have any facial asymmetry.  Rest of neurologic exam was unremarkable.  Her head CT was negative for any acute intracranial process.  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  Her EKG was paced at 65.  PLAN: 1. Altered mental status: at this point seems to     be improving.  Therefore, she is being admitted to rule out TIA     versus stroke.  This is most likely a TIA although it does appear     that she probably has underlying ischemia.  I also suspect that a     viral etiology likely triggered this event since she was having     multiple episodes of diarrhea the night previous to this. She     was probably volume depleted. Her blood pressure in the     ED has been very good despite these multiple episodes of diarrhea.  We     will get an MRI of the brain and she consider getting an EEG     although I doubt that she has any seizure disorder.  We will also     check a UA and culture and an echocardiogram given her history of     CHF and coronary disease.  Could also consider a neurologic consult     if the MRI is suggestive of something more acute.  I would also     recommend that she have some more cognitive screening done, clock     drawing test, for example.  I would imagine that she might have some     underlying dementia although both the patient and her friend, [name],      state that her mental status is generally very good.  However, the     patient is highly educated, so it may be difficult to assess her     mental status. 2. Cardiac for her CHF and hypertension.  She does not appear to be  in     decompensated heart failure.  Her blood pressure is well     controlled.  We will continue her carvedilol and losartan.     However, I am going to decrease her furosemide that down to 20     b.i.d. while she is in the hospital given her blood pressures are     in the 120s, this should be increased back up to her home dose upon     discharge. 3. For her hyperlipidemia, we will continue her simvastatin. 4. For her reflux, we will continue her omeprazole. 5. Urinary incontinence.  Continue oxybutynin.          ______________________________ Clayborn Bigness, MD, MPH     CC/MEDQ  D:  10/05/2010  T:  10/06/2010  Job:  [id number]  Electronically Signed by Clayborn Bigness MD on 11/09/2010 08:07:33 PM

## 2010-11-14 NOTE — Discharge Summary (Signed)
NAMEAUBRIANNE, [name] NO.:  [id number]  MEDICAL RECORD NO.:  [id number]           PATIENT TYPE:  I  LOCATION:  3736                         FACILITY:  [facility]  PHYSICIAN:  Briana Salvia, MD, FACCDATE OF BIRTH:  08/14/1920  DATE OF ADMISSION:  10/18/2010 DATE OF DISCHARGE:  10/19/2010                              DISCHARGE SUMMARY   PRIMARY CARDIOLOGIST:  Briana Hacking, MD  ELECTROPHYSIOLOGIST:  Briana Canning. Ladona Ridgel, MD.  PRIMARY CARE PROVIDER:  Georgann Housekeeper, MD  DISCHARGE DIAGNOSES: 1. History of complete heart block with pacemaker at elective     replacement indicators. 2. Chronic systolic congestive heart failure.  Last documented EF     January 2008 was 50%. 3. Hyperlipidemia. 4. Chronic left bundle-branch block. 5. Anxiety. 6. Depression. 7. A 4.4-cm abdominal aortic aneurysm.  ALLERGIES: 1. ACE INHIBITORS. 2. LIPITOR. 3. MIND ALTERING DRUGS.  PROCEDURES:  Successful generator change-out and upgrade to a Medtronic C4TR01 biventricular permanent pacemaker, serial number [id number].  HISTORY OF PRESENT ILLNESS:  An 77 year old female with prior history of complete heart block as well as chronic systolic heart failure, who is recently seen by [name] in clinic, February 15 secondary to her device reaching elective replacement indicators.  She has also been having worsening heart failure symptoms.  A decision was made to pursue generator change-out and upgrade to a biventricular device.  HOSPITAL COURSE:  The patient presented to [facility] EP lab on February 24 where she was underwent successful explantation of prior Medtronic and rhythm device with placement of a Medtronic C4TR01 biventricular permanent pacemaker.  She tolerated this procedure well, and postprocedure chest x-ray shows no evidence of pneumothorax or other acute cardiopulmonary process.  Postprocedure interrogation found all measurements with acceptable limits.  The patient  will be discharged home today in good condition with followup wound check in approximately 10 days, at which time we will also obtain a basic metabolic panel.  DISCHARGE LABS:  MRSA screen was negative.  DISPOSITION:  The patient will be discharged home today in good condition.  FOLLOWUP APPOINTMENTS:  We will arrange followup with [facility] in approximately 10 days, at which time she will have a basic metabolic panel.  She will follow up with [name] in approximately 3 months and follow with Dr. Donette [name] and Dr. Donnie [name] as previously scheduled.  DISCHARGE MEDICATIONS: 1. Acetaminophen 500 mg q.4 h. p.r.n. 2. Aspirin 81 mg daily. 3. Carvedilol 6.25 mg b.i.d. 4. Lasix 20 mg b.i.d. 5. Glucosamine sulfate 3000 mg daily. 6. Losartan 50 mg half tablet daily. 7. Lutein 20 mg daily. 8. Multivitamin daily. 9. Omeprazole 20 mg daily. 10.Oxybutynin 10 mg daily. 11.Simvastatin 20 mg daily. 12.Vitamin D3 1000 units daily. 13.Acidophilus 1 tablet daily. 14.Cipro 250 mg b.i.d. as previously prescribed.  OUTSTANDING LAB STUDIES:  Follow up basic metabolic panel when seen in device clinic.  DURATION OF DISCHARGE ENCOUNTER:  Thirty-five minutes including physician time.     [name], ANP   ______________________________ Briana Salvia, MD, [facility]    CB/MEDQ  D:  10/19/2010  T:  10/19/2010  Job:  [id number]  cc:  [name], M.D. Briana Housekeeper, MD  Electronically Signed by [name] ANP on 10/29/2010 04:12:50 PM Electronically Signed by Sherryl Manges MD [facility] on 11/14/2010 08:32:38 AM

## 2010-12-10 ENCOUNTER — Other Ambulatory Visit (HOSPITAL_COMMUNITY)
Admission: RE | Admit: 2010-12-10 | Discharge: 2010-12-10 | Disposition: A | Discharge status: Home / Self Care | Payer: Medicare Other | Source: Ambulatory Visit | Attending: Obstetrics and Gynecology | Admitting: Obstetrics and Gynecology

## 2010-12-10 ENCOUNTER — Other Ambulatory Visit: Payer: Self-pay | Admitting: Obstetrics and Gynecology

## 2010-12-10 DIAGNOSIS — N63 Unspecified lump in unspecified breast: Secondary | ICD-10-CM

## 2010-12-10 DIAGNOSIS — Z124 Encounter for screening for malignant neoplasm of cervix: Secondary | ICD-10-CM | POA: Insufficient documentation

## 2010-12-25 ENCOUNTER — Telehealth: Payer: Self-pay | Admitting: Internal Medicine

## 2010-12-25 NOTE — Telephone Encounter (Signed)
Spoke with jean, questions answered Deliah Goody

## 2010-12-25 NOTE — Telephone Encounter (Signed)
They have questions regarding procedure that was done if the pacemaker was removed or just the battery changed

## 2010-12-31 ENCOUNTER — Ambulatory Visit
Admission: RE | Admit: 2010-12-31 | Discharge: 2010-12-31 | Disposition: A | Discharge status: Home / Self Care | Payer: Medicare Other | Source: Ambulatory Visit | Attending: Obstetrics and Gynecology | Admitting: Obstetrics and Gynecology

## 2010-12-31 ENCOUNTER — Other Ambulatory Visit: Payer: Self-pay | Admitting: Obstetrics and Gynecology

## 2010-12-31 DIAGNOSIS — N63 Unspecified lump in unspecified breast: Secondary | ICD-10-CM

## 2011-01-07 ENCOUNTER — Ambulatory Visit
Admission: RE | Admit: 2011-01-07 | Discharge: 2011-01-07 | Disposition: A | Discharge status: Home / Self Care | Payer: Medicare Other | Source: Ambulatory Visit

## 2011-01-07 DIAGNOSIS — N6310 Unspecified lump in the right breast, unspecified quadrant: Secondary | ICD-10-CM

## 2011-01-07 NOTE — Assessment & Plan Note (Signed)
OFFICE VISIT   [name], [name]  DOB:  1920-07-18                                       03/30/2008  ZOXWR#:[id number]   The patient returned to the office today for an elective ultrasound for  surveillance of her AAA.  Her aorta measures a maximal diameter of 4.3  cm, this compares to a previous maximal diameter of 4.2 cm.  No  symptoms, denies abdominal pain or back pain.   Since last seen she has had a left knee replacement which was  uneventful.  No further complaints at this time.  Denies chest pain or  shortness of breath.   CURRENT MEDICATIONS:  1. Detrol 2 mg daily.  2. Simvastatin 20 mg daily.  3. Omeprazole 20 mg daily, Cozaar 25 mg daily.  4. Lutein 6 mg daily.   She also takes Vitamin A, [name] and unsaturated fatty acids.  She takes  acidophilus and glucosamine sulfate.  In addition to this B100, lecithin  trace minerals and 81 mg of Chewable Aspirin.  She takes alendronate 70  mg each Saturday.  Ibuprofen and Extra Strength Tylenol on a p.r.n.  basis.   ALLERGIES:  She is allergic to Vicodin and narcotic pain medications.   PHYSICAL EXAMINATION:  The patient appears generally well.  She is alert  and oriented.  No acute distress.  BP is 138/69, pulse 79 per minute.  Abdomen:  Soft, nontender.  AAA palpable.  No organomegaly other masses.  Intact femoral pulses bilaterally.  No ankle edema.  Dorsalis pedis  pulse 2+ bilaterally.   Overall, the patient is doing well with minimal change in aneurysm size.  No symptoms related to this.  Will plan follow-up again in 6 months with  a repeat ultrasound.   Balinda Quails, M.D.  Electronically Signed   PGH/MEDQ  D:  03/30/2008  T:  03/31/2008  Job:  [id number]   cc:   Georgann Housekeeper, MD

## 2011-01-07 NOTE — Procedures (Signed)
DUPLEX ULTRASOUND OF ABDOMINAL AORTA   INDICATION:  Follow-up evaluation of known abdominal aortic aneurysm.   HISTORY:  Diabetes:  No.  Cardiac:  Yes.  Hypertension:  No.  Smoking:  No.  Connective Tissue Disorder:  Family History:  Previous Surgery:   DUPLEX EXAM:         AP (cm)                   TRANSVERSE (cm)  Proximal             2.5 cm                    2.0 cm  Mid                  4.3 cm                    4.4 cm  Distal               2.0 cm                    2.0 cm  Right Iliac          0.7 cm                    0.7 cm  Left Iliac           0.7 cm                    0.7 cm   PREVIOUS:  Date: 03/30/2008  AP:  4.1  TRANSVERSE:  4.4   IMPRESSION:  Abdominal aortic aneurysm measurements are stable compared  to previous study.   ___________________________________________  P. Liliane Bade, M.D.   MC/MEDQ  D:  10/05/2008  T:  10/05/2008  Job:  [id number]

## 2011-01-07 NOTE — Procedures (Signed)
DUPLEX ULTRASOUND OF ABDOMINAL AORTA   INDICATION:  Abdominal aortic aneurysm.   HISTORY:  Diabetes:  No.  Cardiac:  Yes.  Hypertension:  No.  Smoking:  No.  Connective Tissue Disorder:  Family History:  No.  Previous Surgery:   DUPLEX EXAM:         AP (cm)                   TRANSVERSE (cm)  Proximal             2.1 cm                    2.3 cm  Mid                  4.4 cm                    4.4 cm  Distal               2.4 cm                    2.6 cm  Right Iliac          1.0 cm                    0.9 cm  Left Iliac           0.9 cm                    0.9 cm   PREVIOUS:  Date:  07/06/2009  AP:  4.3  TRANSVERSE:  4.4   IMPRESSION:  Aneurysmal dilatation of the mid abdominal aorta with no  significant change in the maximum diameter when compared to the previous  exam.   ___________________________________________  Larina Earthly, M.D.   CH/MEDQ  D:  01/25/2010  T:  01/26/2010  Job:  [id number]

## 2011-01-07 NOTE — Assessment & Plan Note (Signed)
OFFICE VISIT   Briana, [name]  DOB:  11/24/1919                                       01/25/2010  ZOXWR#:[id number]   The patient presents today for continued follow-up of her asymptomatic  infrarenal abdominal aortic aneurysm.  I have reviewed her medical  records that she has provided from her other medical providers.  She has  had a recent difficulty with some mild congestive heart failure.  She  also is being evaluated for potential left inguinal hernia repair.  She  has a long history of a cystocele with urinary incontinence.  I reviewed  her review of systems at length that this patient has documented in her  chart.  She has no symptoms referable to her AAA .  This has been  followed in our office by Dr. Wilhelmenia Blase for a number of years.  Dr.  Bing Plume is an outside practice and the patient understands that I will  assume the followup of this.  She has had very little growth over time.   SOCIAL HISTORY:  She is married.  She takes care of her infirmed  husband.  She is a prior Dealer.  She does not smoke or drink  alcohol.   PHYSICAL EXAMINATION:  Well developed, well nourished white female  appearing stated age.  Blood pressure 114/69, pulse 79, respirations 16.  She is in no acute distress.  HEENT:  Normal.  Chest:  Clear  bilaterally.  Abdominal exam:  Reveals mild prominent pulsation.  No  tenderness.  She has 2+ femoral,  2+ radial and 2+ dorsalis pedis pulses  bilaterally.  Musculoskeletal:  Shows no major deformities or cyanosis.  Neurologic:  No focal weakness or paresthesias.  She underwent repeat  ultrasound of her aorta in our office today and I have reviewed her  study and discussed this with the patient.  This shows no significant  change with a maximal diameter of 4.4 cm.   I recommend that we continue to see her and noninvasive vascular  laboratory study at 46-month intervals to rule out progression of her  aneurysm.  She  understands symptoms  and will notify me should this  occur.     Larina Earthly, M.D.  Electronically Signed   TFE/MEDQ  D:  01/25/2010  T:  01/25/2010  Job:  [id number]   cc:   Georga Hacking, M.D.  Georgann Housekeeper, MD

## 2011-01-07 NOTE — Assessment & Plan Note (Signed)
OFFICE VISIT   [name], [name]  DOB:  05-Nov-1919                                       09/02/2007  ZOXWR#:[id number]   The patient returns to the office today, an 77 year old female with  known abdominal aortic aneurysm.  She was last seen approximately 1 year  ago.  Returns now with an ultrasound of the abdomen.  This reveals a 4.2  cm abdominal aortic aneurysm.  This is unchanged in size when compared  to CT scan performed in October of 2007.   The patient has no complaints at this time.  Denies abdominal pain or  back pain.  No recent chest pain or shortness of breath.   Since last seen, she has had a left total knee replacement.  This was an  unremarkable operative procedure.  No complaints related to this at this  time.   BP 153/78, pulse 77 per minute, regular.  Respirations 18 per minute.  An 77 year old female who is alert and oriented.  In no acute distress.  Abdomen is soft and nontender.  No masses or organomegaly.  2+ femoral  pulses bilaterally.  Normal bowel sounds.   The patient is doing well without progression in size of her abdominal  aortic aneurysm.  This remains asymptomatic.  Will plan followup again  in 6 months with an ultrasound of the abdomen.   Balinda Quails, M.D.  Electronically Signed   PGH/MEDQ  D:  09/02/2007  T:  09/03/2007  Job:  [id number]   cc:   Georgann Housekeeper, MD

## 2011-01-07 NOTE — Procedures (Signed)
DUPLEX ULTRASOUND OF ABDOMINAL AORTA   INDICATION:  Followup, abdominal aortic aneurysm.   HISTORY:  Diabetes:  No.  Cardiac:  Yes, pacemaker.  Hypertension:  No.  Smoking:  No.  Connective Tissue Disorder:  Family History:  Brother.  Previous Surgery:  No.   DUPLEX EXAM:         AP (cm)                   TRANSVERSE (cm)  Proximal             1.29 Cm                   1.37 cm  Mid                  3.91 cm                   4.24 cm  Distal               2.36 cm                   2.30 cm  Right Iliac          0.86 cm  Left Iliac           0.87 cm   PREVIOUS:  Date: (CT) 05/2006  AP:  4.2  TRANSVERSE:  3.6   IMPRESSION:  Abdominal aortic aneurysm appears stable from previous CT  study.   ___________________________________________  P. Liliane Bade, M.D.   AS/MEDQ  D:  09/02/2007  T:  09/03/2007  Job:  [id number]

## 2011-01-07 NOTE — Procedures (Signed)
DUPLEX ULTRASOUND OF ABDOMINAL AORTA   INDICATION:  Followup of abdominal aortic aneurysm.   HISTORY:  Diabetes:  No.  Cardiac:  Yes.  Hypertension:  No.  Smoking:  No.  Connective Tissue Disorder:  Family History:  Previous Surgery:   DUPLEX EXAM:         AP (cm)                   TRANSVERSE (cm)  Proximal             2.50 cm                   2.32 cm  Mid                  4.10 cm                   4.39 cm  Distal               2.30 cm                   2.32 cm  Right Iliac          1.01 cm                   0.96 cm  Left Iliac           0.90 cm                   0.76 cm   PREVIOUS:  Date:  AP:  3.91  TRANSVERSE:  4.24   IMPRESSION:  Abdominal aortic aneurysm noted with largest measurement of  4.10 cm x 4.32 cm.   ___________________________________________  P. Liliane Bade, M.D.   MG/MEDQ  D:  03/30/2008  T:  03/30/2008  Job:  [id number]

## 2011-01-07 NOTE — Procedures (Signed)
DUPLEX ULTRASOUND OF ABDOMINAL AORTA   INDICATION:  Followup of abdominal aortic aneurysm.   HISTORY:  Diabetes:  No.  Cardiac:  Yes.  Hypertension:  No.  Smoking:  No.  Connective Tissue Disorder:  Family History:  No.  Previous Surgery:   DUPLEX EXAM:         AP (cm)                   TRANSVERSE (cm)  Proximal             3.0 cm                    2.9 cm  Mid                  4.3 cm                    4.4 cm  Distal               2.2 cm                    2.9 cm  Right Iliac          1.4 cm                    1.2 cm  Left Iliac           0.96 cm                   0.72 cm   PREVIOUS:  Date:  10/05/2008  AP:  4.3  TRANSVERSE:  4.4   IMPRESSION:  1. Abdominal aortic aneurysm largest measurement of 4.3 x 4.4 cm.  2. Stable previous exam.   ___________________________________________  Larina Earthly, M.D.   CJ/MEDQ  D:  07/06/2009  T:  07/06/2009  Job:  [id number]

## 2011-01-07 NOTE — Assessment & Plan Note (Signed)
OFFICE VISIT   [name], [name]  DOB:  1920/04/21                                       10/05/2008  EAVWU#:[id number]   The patient returned to the office today for continued ultrasound  surveillance of her AAA.  This is stable size, measuring 4.4 cm in  maximal diameter.  No significant change over the past 6 months.  She  has been free of any symptoms related to abdominal pain or back pain.   MEDICATIONS:  1. [place] XR 60 mg daily.  2. Simvastatin 20 mg daily.  3. Omeprazole 20 mg daily.  4. Cozaar 25 mg daily.  5. Lutein 20 mg daily.  6. She takes multiple vitamins and supplemental minerals.  7. She also takes 1 baby aspirin daily.   PHYSICAL EXAMINATION:  The patient appears generally well.  She is 77  years old, alert and oriented, no distress.  BP 136/66, pulse 82 per  minute, temperature 97.9.  Abdomen:  Soft, nontender.  AAA palpable.  No  organomegaly or other masses felt.  Normal bowel sounds without murmurs,  without abdominal bruits.  Heart sounds are normal without murmurs.  No  carotid bruits.  Regular rate and rhythm.   The patient has a stable abdominal aortic aneurysm measuring 4.4 cm by  ultrasound.  Will plan follow-up again with repeat ultrasound in 6  months.   Balinda Quails, M.D.  Electronically Signed   PGH/MEDQ  D:  10/05/2008  T:  10/06/2008  Job:  [id number]   cc:   Georgann Housekeeper, MD

## 2011-01-07 NOTE — Discharge Summary (Signed)
NAMEANTHONIA, [name] NO.:  [id number]   MEDICAL RECORD NO.:  [id number]          PATIENT TYPE:  INP   LOCATION:  1618                         FACILITY:  [facility]   PHYSICIAN:  [name], [name].  DATE OF BIRTH:  May 26, 1920   DATE OF ADMISSION:  01/04/2007  DATE OF DISCHARGE:  01/07/2007                               DISCHARGE SUMMARY   ADMISSION DIAGNOSIS:  Osteoarthritis of the left knee.   DISCHARGE DIAGNOSES:  1. Osteoarthritis of the left knee.  2. Osteoarthritis of the right knee.  3. History of hypertension.  4. Postoperative acute blood loss anemia.  5. Previous history of hypothyroidism.  6. Pacemaker.   PROCEDURE:  Left total knee arthroplasty.   HISTORY:  [name] is a very pleasant 77 year old white married  female with significant pain in her left knee which now affects her  activities of daily living and ability to care for her husband.  She  states constant, severe pain in the left knee with prolonged sitting.  Activity worsens her symptoms.  Elevation is helpful.  Radiographic end-  stage osteoarthritis is noted of the left knee.  She is now indicated  for left total knee arthroplasty.   HOSPITAL COURSE:  An 77 year old white female, admission Jan 04, 2007,  after appropriate laboratory studies were obtained as well as 1 g of  Ancef IV on call to the operating room.  She was taken to the operating  room where she underwent a left total knee arthroplasty with computer  navigation by Dr. Jonny Ruiz [name] assisted by Arnoldo Morale, PA-C.  She  tolerated the procedure well.  She was placed on Arixtra 2.5 mg subcu  starting at 10 p.m. on the night of her surgery and then q. 8 p.m. x7  doses.  Celebrex 200 mg p.o. b.i.d. was also started postop for pain.  She was placed on CPA machine from 0 to 90 degrees x6-8 hours per day.  Consults of PT/OT, care management, and Dr. Riley Kill with pain management  were obtained.  Physical therapy for ambulation and  weightbearing as  tolerated on the left.  Dr. Riley Kill chose to use Robaxin 500 mg q.8h.  Ultram 50 mg q.12h. scheduled around-the-clock.  Ultram 25 mg q.6h.  p.r.n.  Tylenol 500 mg q.6h. scheduled.  She did do well with this  regimen.  The following day, she was allowed out of bed to a chair.  She  did have a hemoglobin that had dropped to 8.2.  At that time, she was  transfused 2 units of packed cells with 5 of Lasix after each unit.  Social service was consulted for discharge planning.  She was weaned off  of her O2.  Postop day 2, her dressing was changed revealing a benign  incision.  She continued with her physical and occupational therapy.  Once she was ambulatory and stable, she was scheduled for discharge to  [name] where she will continue with her physical therapy and  occupational therapy.  She will also join her husband who is there  presently.   LABORATORY DATA:  Admitted with a hemoglobin of  12.4, hematocrit of  37.4%, white count 6800, platelets 241,000.  Discharge hemoglobin 8,8,  hematocrit 25.5%, white count 5800, platelets 145,000.  Chemistries:  Sodium 144, potassium 4.8, chloride 107, CO2 29, glucose 87, BUN 17,  creatinine 0.65.  GFR was greater than 60, total protein 5.9, albumin  3.5, AST 19, ALT 13, ALP 59, and total bilirubin 0.9.  Urinalysis was  benign for a voided urine.  Given 2 units of blood during her postop  course, and her urine culture showed no growth.  Discharge sodium was  138, potassium 3.5, chloride 108, pO2 26, glucose 117, BUN 15, and  creatinine 0.79.  No radiographic studies are on the chart at the  present time.  EKG on the chart did not have a reading.   DISCHARGE INSTRUCTIONS:  1. She can resume a normal diet.  2. Increase her activity slowly.  3. Crutches or walker.  4. Weightbearing as tolerated on the left.  5. She may shower or bathe on Thursday.  She is not allowed to immerse      the wounds but can shower over them.  She is not  to scrub the      wounds on the leg, but the water may run over them.  6. No lifting or driving x6 weeks.  7. CPM 0-90 degrees for 6-8 hours per day and may increase this by 5-      10 degrees a day if necessary until she reaches 110 degrees.  8. She will use Tylenol 500 mg 1 tab q.6h.  9. Ultram 25 mg 1 tab q.12h.  10.Also, Ultram 25 mg 1 tab q.12h. p.r.n. pain.  11.Celebrex 200 mg twice daily x10 days then daily.  12.She will continue with Zocor 25 mg q.a.m.  13.Fosamax 70 mg weekly.  14.Cozaar 25 mg q.a.m.  15.Detrol LA 4 mg h.s.  16.Nexium 40 mg p.o. q.a.m.  17.Lutein 6 mg q.a.m.  18.Metoprolol ER 50 mg q.a.m.  19.B-100 1 q.a.m.  20.Glucosamine 1500 mg q.a.m.  This may be started 1 week after      surgery.  21.Acidophilus 1 p.o. in the morning.  22.Lecithin 1 p.o. in the morning.  23.Vitamin C plus D 600 mg 1 q.a.m.  24.She will need a CBC and BMET on Friday.  25.Physical therapy for ambulation, weightbearing as tolerated on the      left, and to begin passive and active assisted and active range of      motion to the knee as well as gentle graduated strengthening of      both knees.   She will need to follow back up with Dr. Priscille Kluver on Jan 09, 2007.  She  is to call and have an appointment scheduled at that time.  She was  discharged in improved condition.      [name], [name].      [name], [name].  Electronically Signed    BDP/MEDQ  D:  01/07/2007  T:  01/07/2007  Job:  [id number]

## 2011-01-07 NOTE — Procedures (Signed)
DUPLEX ULTRASOUND OF ABDOMINAL AORTA   INDICATION:  Followup known AAA.   HISTORY:  Diabetes:  No.  Cardiac:  Yes.  Hypertension:  No.  Smoking:  No.  Connective Tissue Disorder:  Family History:  No.  Previous Surgery:  No.   DUPLEX EXAM:         AP (cm)                   TRANSVERSE (cm)  Proximal             2.0 cm                    2.2 cm  Mid                  4.2 cm                    4.4 cm  Distal               2.6 cm                    2.6 cm  Right Iliac          1.0 cm                    1.0 cm  Left Iliac           1.0 cm                    0.9 cm   PREVIOUS:  Date:  01/25/2010  AP:  4.4  TRANSVERSE:  4.4   IMPRESSION:  Aneurysmal dilatation of the mid abdominal aorta with no  significant change in the maximum diameter when compared to the previous  study.   ___________________________________________  Larina Earthly, M.D.   EM/MEDQ  D:  08/12/2010  T:  08/12/2010  Job:  [id number]

## 2011-01-10 NOTE — Op Note (Signed)
[name], [name] NO.:  [id number]   MEDICAL RECORD NO.:  [id number]          PATIENT TYPE:  INP   LOCATION:  X005                         FACILITY:  [facility]   PHYSICIAN:  John L. Rendall, M.D.  DATE OF BIRTH:  [birth year]   DATE OF PROCEDURE:  01/04/2007  DATE OF DISCHARGE:                               OPERATIVE REPORT   PREOPERATIVE DIAGNOSIS:  Osteoarthritis left knee.   SURGICAL PROCEDURES:  Left LCS total knee replacement with computer  navigation assistance.   POSTOPERATIVE DIAGNOSIS:  Osteoarthritis left knee.   SURGEON:  Dr. Priscille Kluver   ASSISTANT:  Duffy PAC   ANESTHESIA:  General with femoral nerve block.   PATHOLOGY:  The patient has bone against bone medially left knee and has  failed all conservative measures.  She additionally has varus deformity  measured at the start of the case of 12 degrees and chronic pain with  weightbearing.   PROCEDURE:  Under general anesthesia with femoral nerve block.  The left  leg is prepared with DuraPrep and draped as a sterile field. Sterile  tourniquet is put on the leg and it is wrapped out with an Esmarch and  the tourniquet is used at 300 mmHg.  A medial parapatellar incision is  made.  The patella was everted. Femur was sized at a standard.  Debridement of the joint is done in preparation for computer mapping.  Then Schanz pins were placed in the proximal medial tibial through  accessory stab wounds. Distal femoral pins were placed through the  midline incision in the medial distal femur.  The computer arrays were  set up and the femoral head was identified and medial and lateral  malleoli. Proximal tibia and distal femur are then mapped and with  mapping complete, the computer is used for proximal tibial resection,  taking 2 mm from the low side and 10 from the high side.  Once proximal  tibia was resected.  The ligaments were balanced and the computer is  quite helpful in relieving the 12 degrees  varus deformity.  Spurs were  taken down off the medial femoral condyle.  The medial sleeve of the  capsule and ligament is released around the posterior medial corner.  Some of the pes tendons are released and in so doing and the knee is  brought into full extension.  The flexion gap was then measured with the  ligaments balanced. Attention was then turned to the femoral cuts. The  anterior and posterior flare of the distal femur was resected first with  an approximately 10 mm flexion gap. Distal femoral cut was then made  with an approximate 12.5 mm extension gap. The lamina spreader was  inserted and remnants of the menisci were trimmed and cruciate  ligaments.  Once the debridement was completed, the recessing guide is  then used.  Spurs were taken off the back of the femoral condyles.  The  proximal tibia is then exposed.  It is sized at 2.5. Center peg hole  with keel is inserted.  Trial reduction of a 2.5 tibia with a 12.5  bearing and a standard  femur reveals some hyperextension and good  longitudinal alignment within approximately degree and half of anatomic  alignment.  12.5 bearing was replaced by 15 to correct some  hyperextension and in so doing better longitudinal alignment is obtained  at approximately 1 degree valgus. This was then chosen. The bony  surfaces were then prepared for pulse irrigation and permanent  components were obtained. Patella was osteotomized and three peg holes  inserted.  Once this was completed, the components were inserted and  cemented in place. The tourniquet is let down at 1 hour 15 minutes.  Once cement is hardened, the  multiple small vessels were cauterized. Knee is then closed in layers  with #1 Tycron, #1-0 and #2-0 Vicryl and skin clips.  Operative time an  hour and half. The patient tolerated the procedure well and returned to  recovery in good condition.      John L. Rendall, M.D.  Electronically Signed     JLR/MEDQ  D:   01/04/2007  T:  01/04/2007  Job:  [id number]

## 2011-01-10 NOTE — H&P (Signed)
NAME:  [name], [name]                    ACCOUNT NO.:  [id number]   MEDICAL RECORD NO.:  [id number]                   PATIENT TYPE:  INP   LOCATION:  5727                                 FACILITY:  [facility]   PHYSICIAN:  Hal T. Stoneking, M.D.              DATE OF BIRTH:  [birth year]   DATE OF ADMISSION:  09/11/2002  DATE OF DISCHARGE:                                HISTORY & PHYSICAL   IDENTIFYING INFORMATION:  The patient is an amazingly independent 77-year-  old white female who lives at [facility] with her husband.  She was doing  well until approximately three days ago. She awakened from a nap with what  she describes as fairly vivid vertigo.  She pressed her Lifeline, and EMS  took her to the emergency room.  At that time, she apparently had a CT scan  of the brain which was unrevealing and workup negative except that she  apparently had some bacteria and white cells in her urine.  She denied any  urinary symptoms, stated that she had not had a fever.  She was given  Tequin.  Then yesterday she felt fine.  This morning she awakened at 8:30,  went to the bathroom, and noticed again sudden onset of vertiginous symptoms  associated with an unsteady gait. She had to hold on to things getting back  to her bed. She states now she can get up and walk.  The vertiginous  symptoms are less.  She did take 4 baby aspirin three days ago as well as  this morning.  She has had no fever, no headache, has had no recent trauma,  denied any chest pain, no palpitations.  She has had no prior history of  vascular disease.   MEDICATIONS:  1. Currently on Tequin, although she has only taken one dose.  2. Zocor, she believes 40 mg a day,  3. Fosamax 70 mg once a week.  4. Calcium carbonate twice a day.  5. Nexium 40 mg p.r.n.  6. Omega 3 supplementation as well as a number of other supplements     including vitamins E. A, B, C, glucosamine, selenium, acidophilus, and     lecithin.   PAST  MEDICAL HISTORY:  1. Hypercholesterolemia.  2. Osteoporosis.  3. Mild GERD followed by Dr. Wellington Hampshire.  4. No history of diabetes, stroke, hypertension, or coronary artery disease.   PAST SURGICAL HISTORY:  1. Right and left benign cysts removed with cosmetic flap.  2. Arthroscopic knee surgery on both knees.  3. Bilateral cataract extractions with lens implants.   FAMILY HISTORY:  Remarkable for coronary artery disease, diabetes.  No  history of colon cancer.   SOCIAL HISTORY:  She is married and has two healthy children. She does not  smoke, occasionally has a glass of sherry.  She is retired.  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   REVIEW OF SYSTEMS:  She has had no acute change in her vision.  She has  occasionally felt a little fullness in her left ear but no acute change in  her hearing, does wear hearing aids.  No chest pain, no shortness of breath,  no abdominal pain.  Bowels move normally.  No GU complaints.   PHYSICAL EXAMINATION:  VITAL SIGNS:  Temperature 97.6, blood pressure  185/72, pulse 78, respiratory rate 20.  SKIN:  Warm and dry.  HEENT;  Pupils are equal, round, and reactive to light. Extraocular muscles  intact.  Disks are sharp.  TMs normal.  Oropharynx moist.  Palate elevates  well.  NECK:  She has no carotid bruits.  LUNGS:  Clear.  HEART: Regular rate and rhythm without murmurs, rubs, or gallops.  ABDOMEN:  Soft.  No hepatosplenomegaly or masses palpated.  NEUROLOGIC:  She is alert and oriented x 3.  Cranial nerves II-XII intact.  Motor exam 5/5 strength bilaterally.  Sensation intact throughout.  Cerebellar: Finger-to-nose and heel-to-shin testing is normal.  Gait steady,  narrow based.  Deep tendon reflexes 2+, and the plantar response is flexor.   ADMISSION  LABORATORY DATA:  EKG shows intraventricular conduction delay with  secondary T wave changes.   White count 6600, hemoglobin 12.9, platelet count 209,000, 75% neutrophils,  18% lymphs.  Creatinine 0.7.  CK 50, CK-MB 1.3, troponin 0.02.  Electrolytes: Glucose 115, sodium 139, BUN 18, potassium 3.7, chloride 108.   ASSESSMENT:  Vertiginous symptoms, suspect neuronitis or labyrinthitis.  Cannot rule out posterior circulation transient ischemic attack.   PLAN:  Will admit.  Will give her p.r.n. meclizine.  Will give her 1 baby  aspirin a day.  Will obtain an MRI/MRA of the brain to assess posterior  circulation.  Will repeat her EKG with the intraventricular conduction delay  and T wave changes tomorrow morning.                                               Hal T. Pete Glatter, M.D.    HTS/MEDQ  D:  09/11/2002  T:  09/11/2002  Job:  [id number]

## 2011-01-10 NOTE — Discharge Summary (Signed)
   NAME:  [name], SAN                    ACCOUNT NO.:  [id number]   MEDICAL RECORD NO.:  [id number]                   PATIENT TYPE:  INP   LOCATION:  5727                                 FACILITY:  [facility]   PHYSICIAN:  Georgann Housekeeper, M.D.                 DATE OF BIRTH:  20-Jan-1920   DATE OF ADMISSION:  09/11/2002  DATE OF DISCHARGE:  09/13/2002                                 DISCHARGE SUMMARY   HISTORY AND PHYSICAL:  Please see H&P for details   DISCHARGE DIAGNOSES:  1. Dizziness secondary to benign positional vertigo.  2. Hypertension.   MEDICATIONS ON DISCHARGE:  1. Zocor 40 mg every day  2. Aspirin 81 mg every day  3. Meclizine 12.5 mg q.h.s. p.r.n.  4. Aspirin 81 mg every day  5. Fosamax 70 mg once a week.  6. Nexium once a day.  7. Omega-3 fish oil.  8. Glucosamine chondroitin.   CONDITION ON DISCHARGE:  Stable.   HOSPITAL COURSE:  Dizziness.  The patient is an 77 year old admitted with an  episode of vertiginous dizziness.  Had history of ear problems and has seen  Dr. Izola Price in the past.  Her blood work was unremarkable.  EKG was negative.  Chest x-ray was negative.  The patient was admitted.  Her dizziness did seem  to resolve.  She had a CT scan initially of the head which was negative.  It  showed an old lacunar infarct.  She underwent an MRI, MRA which was  negative.  EKG showed normal sinus rhythm, left bundle branch block.  No  change from previous.  Her blood pressure in the hospital was mildly  elevated to 150's at the time of discharge.  The patient had never had any  blood pressure medications before.  The patient did well without any  dizziness with walking, and will have follow up blood pressure at the  office.  I told her that if the dizziness persists, she will need an ENT  evaluation for vertigo.  Meclizine tables were given to her before time of  discharge, and instructions to use it.                                               Georgann Housekeeper, M.D.    Arliss Journey  D:  10/03/2002  T:  10/03/2002  Job:  [id number]

## 2011-01-13 ENCOUNTER — Ambulatory Visit
Admission: RE | Admit: 2011-01-13 | Discharge: 2011-01-13 | Disposition: A | Discharge status: Home / Self Care | Payer: Medicare Other | Source: Ambulatory Visit | Attending: Obstetrics and Gynecology | Admitting: Obstetrics and Gynecology

## 2011-01-13 ENCOUNTER — Ambulatory Visit
Admission: RE | Admit: 2011-01-13 | Discharge: 2011-01-13 | Disposition: A | Discharge status: Home / Self Care | Payer: Medicare Other | Source: Ambulatory Visit

## 2011-01-13 DIAGNOSIS — N6310 Unspecified lump in the right breast, unspecified quadrant: Secondary | ICD-10-CM

## 2011-01-13 DIAGNOSIS — N63 Unspecified lump in unspecified breast: Secondary | ICD-10-CM

## 2011-01-16 ENCOUNTER — Other Ambulatory Visit: Payer: Self-pay | Admitting: Obstetrics and Gynecology

## 2011-01-16 DIAGNOSIS — N6310 Unspecified lump in the right breast, unspecified quadrant: Secondary | ICD-10-CM

## 2011-01-28 ENCOUNTER — Encounter: Payer: Self-pay | Admitting: Internal Medicine

## 2011-01-30 ENCOUNTER — Encounter (INDEPENDENT_AMBULATORY_CARE_PROVIDER_SITE_OTHER): Payer: Medicare Other

## 2011-01-30 DIAGNOSIS — I714 Abdominal aortic aneurysm, without rupture: Secondary | ICD-10-CM

## 2011-02-06 NOTE — Procedures (Unsigned)
DUPLEX ULTRASOUND OF ABDOMINAL AORTA  INDICATION:  Followup abdominal aortic aneurysm.  HISTORY: Diabetes:  No Cardiac:  Yes Hypertension:  No Smoking:  No Connective Tissue Disorder: Family History:  No Previous Surgery:  No  DUPLEX EXAM:         AP (cm)                   TRANSVERSE (cm) Proximal             2.98 cm                   2.82 cm Mid                  4.28 cm                   4.35 cm Distal               2.50 cm                   2.54 cm Right Iliac          0.95 cm                   0.97 cm Left Iliac           1.17 cm                   1.17 cm  PREVIOUS:  Date: 08/12/2010  AP:  4.2  TRANSVERSE:  4.4  IMPRESSION: 1. Infrarenal abdominal aortic aneurysm present measuring 4.28 cm x     4.35 cm without intramural thrombus present. 2. Essentially unchanged since previous study on 08/12/2010.  ___________________________________________ Larina Earthly, M.D.  SH/MEDQ  D:  01/30/2011  T:  01/30/2011  Job:  [id number]

## 2011-02-17 ENCOUNTER — Encounter: Payer: Self-pay | Admitting: *Deleted

## 2011-02-18 ENCOUNTER — Ambulatory Visit (INDEPENDENT_AMBULATORY_CARE_PROVIDER_SITE_OTHER): Payer: Medicare Other | Admitting: Internal Medicine

## 2011-02-18 ENCOUNTER — Encounter: Payer: Self-pay | Admitting: Internal Medicine

## 2011-02-18 DIAGNOSIS — I495 Sick sinus syndrome: Secondary | ICD-10-CM

## 2011-02-18 DIAGNOSIS — I5022 Chronic systolic (congestive) heart failure: Secondary | ICD-10-CM

## 2011-02-18 DIAGNOSIS — Z950 Presence of cardiac pacemaker: Secondary | ICD-10-CM

## 2011-02-18 NOTE — Patient Instructions (Signed)
Your physician recommends that you schedule a follow-up appointment as needed  

## 2011-02-18 NOTE — Progress Notes (Signed)
HPI [name] returns today for followup. She is an 77 year old woman with complete heart block status post dual-chamber pacemaker insertion who then developed worsening congestive heart failure and left ventricular dysfunction. Several months ago, she underwent IV pacemaker insertion secondary to all the above and an ejection fraction of 25%. Post procedure she has done well. The patient notes that her dyspnea is much improved on from class III to almost class I. She denies peripheral edema. She denies sodium indiscretion. She has had no chest pain. Allergies  Allergen Reactions  . Ace Inhibitors   . Atorvastatin      Current Outpatient Prescriptions  Medication Sig Dispense Refill  . acetaminophen (TYLENOL) 500 MG tablet Take 500 mg by mouth as needed.        Marland Kitchen aspirin 81 MG tablet Take 81 mg by mouth daily.        . carvedilol (COREG) 6.25 MG tablet Take 6.25 mg by mouth 2 (two) times daily.        . cholecalciferol (VITAMIN D) 1000 UNITS tablet Take 1,000 Units by mouth daily.        . furosemide (LASIX) 20 MG tablet Take 20 mg by mouth 2 (two) times daily.        . Glucosamine 500 MG CAPS Take by mouth. 3000 mg daily?       . losartan (COZAAR) 50 MG tablet Take 50 mg by mouth daily.        . Lutein 20 MG CAPS Take by mouth daily.        . Multiple Vitamins-Minerals (MULTIVITAL) tablet Take 1 tablet by mouth daily.       Marland Kitchen omeprazole (PRILOSEC) 20 MG capsule Take 20 mg by mouth daily.        Marland Kitchen oxybutynin (DITROPAN-XL) 10 MG 24 hr tablet Take 10 mg by mouth daily.        . simvastatin (ZOCOR) 20 MG tablet Take 20 mg by mouth at bedtime.           Past Medical History  Diagnosis Date  . Aortic valve regurgitation   . Congenital heart disease   . Left bundle branch block   . Hyperlipidemia   . Mitral valve disorder   . Pacemaker   . Dyspnea   . Anxiety   . Depression     ROS:   All systems reviewed and negative except as noted in the HPI.   Past Surgical History    Procedure Date  . Knee arthroscopy   . Pacemaker insertion      No family history on file.   History   Social History  . Marital Status: Married    Spouse Name: N/A    Number of Children: N/A  . Years of Education: N/A   Occupational History  . Not on file.   Social History Main Topics  . Smoking status: Never Smoker   . Smokeless tobacco: Not on file  . Alcohol Use: Yes  . Drug Use: No  . Sexually Active: Not on file   Other Topics Concern  . Not on file   Social History Narrative   Regular exercise- yes. Retired and married.      BP 129/55  Pulse 69  Ht 4\' 11"  (1.499 m)  Wt 110 lb (49.896 kg)  BMI 22.22 kg/m2  Physical Exam:  Well appearing NAD HEENT: Unremarkable Neck:  No JVD, no thyromegally Lymphatics:  No adenopathy Back:  No CVA tenderness Lungs:  Clear. Well-healed pacemaker  incision. HEART:  Regular rate rhythm, no murmurs, no rubs, no clicks Abd:  Flat, positive bowel sounds, no organomegally, no rebound, no guarding Ext:  2 plus pulses, no edema, no cyanosis, no clubbing Skin:  No rashes no nodules Neuro:  CN II through XII intact, motor grossly intact  DEVICE  Normal device function.  See PaceArt for details.   Assess/Plan:

## 2011-02-18 NOTE — Assessment & Plan Note (Signed)
Her chronic systolic heart failure has improved going from class III to class 2. For her age it may even be class I. She will continue her current medications and maintain a low-sodium diet.

## 2011-02-18 NOTE — Assessment & Plan Note (Signed)
Her biventricular pacemaker is working normally. Her programming is satisfactory. I will refer her back to Dr. Viann Fish who is her primary cardiologist for ongoing followup. I will see her back as needed.

## 2011-07-13 ENCOUNTER — Inpatient Hospital Stay (HOSPITAL_COMMUNITY)
Admission: EM | Admit: 2011-07-13 | Discharge: 2011-07-18 | DRG: 481 | Disposition: A | Discharge status: Skilled Nursing Facility | Payer: Medicare Other | Attending: Internal Medicine | Admitting: Internal Medicine

## 2011-07-13 ENCOUNTER — Encounter (HOSPITAL_COMMUNITY): Payer: Self-pay

## 2011-07-13 ENCOUNTER — Emergency Department (HOSPITAL_COMMUNITY): Payer: Medicare Other

## 2011-07-13 DIAGNOSIS — I5022 Chronic systolic (congestive) heart failure: Secondary | ICD-10-CM | POA: Diagnosis present

## 2011-07-13 DIAGNOSIS — F329 Major depressive disorder, single episode, unspecified: Secondary | ICD-10-CM

## 2011-07-13 DIAGNOSIS — I351 Nonrheumatic aortic (valve) insufficiency: Secondary | ICD-10-CM | POA: Diagnosis present

## 2011-07-13 DIAGNOSIS — I10 Essential (primary) hypertension: Secondary | ICD-10-CM | POA: Diagnosis present

## 2011-07-13 DIAGNOSIS — Q249 Congenital malformation of heart, unspecified: Secondary | ICD-10-CM

## 2011-07-13 DIAGNOSIS — S72143A Displaced intertrochanteric fracture of unspecified femur, initial encounter for closed fracture: Principal | ICD-10-CM | POA: Diagnosis present

## 2011-07-13 DIAGNOSIS — S72141A Displaced intertrochanteric fracture of right femur, initial encounter for closed fracture: Secondary | ICD-10-CM | POA: Diagnosis present

## 2011-07-13 DIAGNOSIS — K219 Gastro-esophageal reflux disease without esophagitis: Secondary | ICD-10-CM | POA: Diagnosis present

## 2011-07-13 DIAGNOSIS — I447 Left bundle-branch block, unspecified: Secondary | ICD-10-CM

## 2011-07-13 DIAGNOSIS — E785 Hyperlipidemia, unspecified: Secondary | ICD-10-CM | POA: Diagnosis present

## 2011-07-13 DIAGNOSIS — R0600 Dyspnea, unspecified: Secondary | ICD-10-CM

## 2011-07-13 DIAGNOSIS — D696 Thrombocytopenia, unspecified: Secondary | ICD-10-CM | POA: Diagnosis present

## 2011-07-13 DIAGNOSIS — I359 Nonrheumatic aortic valve disorder, unspecified: Secondary | ICD-10-CM | POA: Diagnosis present

## 2011-07-13 DIAGNOSIS — I714 Abdominal aortic aneurysm, without rupture, unspecified: Secondary | ICD-10-CM | POA: Insufficient documentation

## 2011-07-13 DIAGNOSIS — D62 Acute posthemorrhagic anemia: Secondary | ICD-10-CM

## 2011-07-13 DIAGNOSIS — I509 Heart failure, unspecified: Secondary | ICD-10-CM | POA: Diagnosis present

## 2011-07-13 DIAGNOSIS — W19XXXA Unspecified fall, initial encounter: Secondary | ICD-10-CM | POA: Diagnosis present

## 2011-07-13 DIAGNOSIS — I059 Rheumatic mitral valve disease, unspecified: Secondary | ICD-10-CM | POA: Diagnosis present

## 2011-07-13 DIAGNOSIS — R32 Unspecified urinary incontinence: Secondary | ICD-10-CM | POA: Diagnosis present

## 2011-07-13 DIAGNOSIS — K5900 Constipation, unspecified: Secondary | ICD-10-CM | POA: Diagnosis not present

## 2011-07-13 DIAGNOSIS — F419 Anxiety disorder, unspecified: Secondary | ICD-10-CM

## 2011-07-13 DIAGNOSIS — Z950 Presence of cardiac pacemaker: Secondary | ICD-10-CM

## 2011-07-13 DIAGNOSIS — S72009A Fracture of unspecified part of neck of unspecified femur, initial encounter for closed fracture: Secondary | ICD-10-CM

## 2011-07-13 HISTORY — DX: Abdominal aortic aneurysm, without rupture: I71.4

## 2011-07-13 HISTORY — DX: Displaced intertrochanteric fracture of right femur, initial encounter for closed fracture: S72.141A

## 2011-07-13 HISTORY — DX: Abdominal aortic aneurysm, without rupture, unspecified: I71.40

## 2011-07-13 LAB — BASIC METABOLIC PANEL
CO2: 24 mEq/L (ref 19–32)
Calcium: 9.9 mg/dL (ref 8.4–10.5)
Chloride: 103 mEq/L (ref 96–112)
Creatinine, Ser: 0.84 mg/dL (ref 0.50–1.10)
Glucose, Bld: 122 mg/dL — ABNORMAL HIGH (ref 70–99)

## 2011-07-13 LAB — CBC
HCT: 36.4 % (ref 36.0–46.0)
Hemoglobin: 11.7 g/dL — ABNORMAL LOW (ref 12.0–15.0)
MCV: 97.6 fL (ref 78.0–100.0)
RBC: 3.73 MIL/uL — ABNORMAL LOW (ref 3.87–5.11)
WBC: 9.9 10*3/uL (ref 4.0–10.5)

## 2011-07-13 LAB — DIFFERENTIAL
Eosinophils Relative: 1 % (ref 0–5)
Lymphocytes Relative: 13 % (ref 12–46)
Lymphs Abs: 1.3 10*3/uL (ref 0.7–4.0)
Monocytes Absolute: 0.7 10*3/uL (ref 0.1–1.0)
Monocytes Relative: 7 % (ref 3–12)

## 2011-07-13 MED ORDER — ASPIRIN 81 MG PO CHEW
81.0000 mg | CHEWABLE_TABLET | Freq: Every day | ORAL | Status: DC
  Administered 2011-07-15 (×2): 81 mg via ORAL
  Filled 2011-07-13 (×3): qty 1

## 2011-07-13 MED ORDER — ONDANSETRON HCL 4 MG/2ML IJ SOLN: 4.0000 mg | Freq: Once | INTRAMUSCULAR | Status: DC

## 2011-07-13 MED ORDER — ACETAMINOPHEN 325 MG PO TABS
650.0000 mg | ORAL_TABLET | Freq: Four times a day (QID) | ORAL | Status: DC | PRN
  Administered 2011-07-17 – 2011-07-18 (×3): 650 mg via ORAL
  Filled 2011-07-13 (×5): qty 2

## 2011-07-13 MED ORDER — SENNA 8.6 MG PO TABS
2.0000 | ORAL_TABLET | Freq: Every day | ORAL | Status: DC | PRN
  Administered 2011-07-16: 17.2 mg via ORAL
  Filled 2011-07-13: qty 2

## 2011-07-13 MED ORDER — ONDANSETRON HCL 4 MG PO TABS: 4.0000 mg | ORAL_TABLET | Freq: Four times a day (QID) | ORAL | Status: DC | PRN

## 2011-07-13 MED ORDER — ACETAMINOPHEN 650 MG RE SUPP: 650.0000 mg | Freq: Four times a day (QID) | RECTAL | Status: DC | PRN

## 2011-07-13 MED ORDER — ASPIRIN 81 MG PO TABS: 81.0000 mg | ORAL_TABLET | Freq: Every day | ORAL | Status: DC

## 2011-07-13 MED ORDER — SIMVASTATIN 20 MG PO TABS
20.0000 mg | ORAL_TABLET | Freq: Every day | ORAL | Status: DC
  Administered 2011-07-15 – 2011-07-17 (×4): 20 mg via ORAL
  Filled 2011-07-13 (×5): qty 1

## 2011-07-13 MED ORDER — MORPHINE SULFATE 2 MG/ML IJ SOLN: 2.0000 mg | Freq: Once | INTRAMUSCULAR | Status: DC

## 2011-07-13 MED ORDER — LOSARTAN POTASSIUM 25 MG PO TABS
25.0000 mg | ORAL_TABLET | Freq: Every day | ORAL | Status: DC
  Administered 2011-07-14 – 2011-07-18 (×4): 25 mg via ORAL
  Filled 2011-07-13 (×5): qty 1

## 2011-07-13 MED ORDER — HEPARIN SODIUM (PORCINE) 5000 UNIT/ML IJ SOLN
5000.0000 [IU] | Freq: Three times a day (TID) | INTRAMUSCULAR | Status: DC
  Filled 2011-07-13 (×5): qty 1

## 2011-07-13 MED ORDER — SODIUM CHLORIDE 0.9 % IV SOLN
INTRAVENOUS | Status: DC
  Administered 2011-07-13 – 2011-07-14 (×2): via INTRAVENOUS

## 2011-07-13 MED ORDER — IBUPROFEN 800 MG PO TABS
800.0000 mg | ORAL_TABLET | Freq: Once | ORAL | Status: AC
  Administered 2011-07-13: 800 mg via ORAL
  Filled 2011-07-13: qty 1

## 2011-07-13 MED ORDER — DOCUSATE SODIUM 100 MG PO CAPS
100.0000 mg | ORAL_CAPSULE | Freq: Two times a day (BID) | ORAL | Status: DC
  Administered 2011-07-14 – 2011-07-15 (×2): 100 mg via ORAL
  Filled 2011-07-13 (×4): qty 1

## 2011-07-13 MED ORDER — ONDANSETRON HCL 4 MG/2ML IJ SOLN: 4.0000 mg | Freq: Four times a day (QID) | INTRAMUSCULAR | Status: DC | PRN

## 2011-07-13 MED ORDER — IBUPROFEN 400 MG PO TABS
400.0000 mg | ORAL_TABLET | Freq: Four times a day (QID) | ORAL | Status: DC | PRN
  Administered 2011-07-15: 800 mg via ORAL
  Filled 2011-07-13 (×2): qty 2

## 2011-07-13 MED ORDER — CARVEDILOL 6.25 MG PO TABS
6.2500 mg | ORAL_TABLET | Freq: Two times a day (BID) | ORAL | Status: DC
  Administered 2011-07-13 – 2011-07-18 (×9): 6.25 mg via ORAL
  Filled 2011-07-13 (×11): qty 1

## 2011-07-13 MED ORDER — ENOXAPARIN SODIUM 40 MG/0.4ML ~~LOC~~ SOLN
40.0000 mg | Freq: Every day | SUBCUTANEOUS | Status: DC
  Filled 2011-07-13 (×2): qty 0.4

## 2011-07-13 MED ORDER — CEFAZOLIN SODIUM 1-5 GM-% IV SOLN
1.0000 g | INTRAVENOUS | Status: AC
  Administered 2011-07-14 – 2011-07-15 (×2): 1 g via INTRAVENOUS
  Filled 2011-07-13: qty 50

## 2011-07-13 NOTE — H&P (Signed)
PCP:  PRIMARY CARE PROVIDER:  Georgann Housekeeper, MD  Orthopedist [facility]  PRIMARY CARDIOLOGIST:  Georga Hacking, MD  ELECTROPHYSIOLOGIST:  Doylene Canning. Ladona Ridgel, MD.   Chief Complaint:  Fall and femur fracture   HPI:  [phone] with h/o complete heart block s/p dual chamber pacemaker, abdominal aortic aneurysm, congenital heart disease (unclear what, pt denies). Notes also indicate chronic systolic HF, however last echo 2008 showed EF 50% with normal LV systolic dysfxn.    Pt had mechanical fall today while helping clean up her husband. Denies presyncope, or any other worrisome symptoms, she had a simple mechanical fall due to tripping. She was brought to the ED.   In the ED, vitals were stable. Labs with normal chem panel except renal 30/0.84, CBC normal.  CXR showed stable cardiomegaly, no acute process. R femur plain film showed comminuted  intertrochanteric and subtrochancteric fracture of R prox femur.   ED called ortho who advised medical admission and they would see the patient. She's currently feeling OK, minimal pain, and wants to avoid morphine.   ROS as above, otherwise the patient has been feeling well and in her usual state of good health and activity give her advanced age. 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 She relates no chest pain, pressure, SOB, DOE with doing her usual house work, and can walk on flat ground without her walker around the house without symptom limiting her. She also denies orthopnea, LE swelling, fevers, chills, sweats, malaise, cough, GI symptoms of n/v/d/c/abd pain, dysuria. ROS o/w negative.   She denies diabetes, h/o strokes, h/o lung or renal disease.    Past Medical History  Diagnosis Date  . Aortic valve regurgitation   . Congenital heart disease   . Left bundle branch block   . Hyperlipidemia   . Mitral valve disorder   . Pacemaker     For complete heart block, s/p generator change 09/2010   . Dyspnea   .  Anxiety   . Depression   . Aneurysm of abdominal aorta     4.4 cm in 09/2010    Past Surgical History  Procedure Date  . Knee arthroscopy   . Pacemaker insertion     Medications:  HOME MEDS: Prior to Admission medications   Medication Sig Start Date End Date Taking? Authorizing Provider  acetaminophen (TYLENOL) 500 MG tablet Take 500 mg by mouth as needed.     Yes Historical Provider, MD  aspirin 81 MG tablet Take 81 mg by mouth daily.     Yes Historical Provider, MD  beta carotene w/minerals (OCUVITE) tablet Take 1 tablet by mouth daily.     Yes Historical Provider, MD  carvedilol (COREG) 6.25 MG tablet Take 6.25 mg by mouth 2 (two) times daily.     Yes Historical Provider, MD  cholecalciferol (VITAMIN D) 1000 UNITS tablet Take 1,000 Units by mouth daily.     Yes Historical Provider, MD  furosemide (LASIX) 20 MG tablet Take 20 mg by mouth 2 (two) times daily.     Yes Historical Provider, MD  Glucosamine 500 MG CAPS Take by mouth. 3000 mg daily?   Yes Historical Provider, MD  losartan (COZAAR) 50 MG tablet Take 25 mg by mouth daily.    Yes Historical Provider, MD  Lutein 20 MG CAPS Take 20 mg by mouth daily.    Yes Historical Provider, MD  meclizine (ANTIVERT) 25 MG tablet Take 25 mg by mouth 3 (three) times  daily as needed. Nausea/dizziness    Yes Historical Provider, MD  Multiple Vitamins-Minerals (MULTIVITAL) tablet Take 1 tablet by mouth daily.    Yes Historical Provider, MD  omeprazole (PRILOSEC) 20 MG capsule Take 20 mg by mouth daily.     Yes Historical Provider, MD  oxybutynin (DITROPAN-XL) 10 MG 24 hr tablet Take 10 mg by mouth daily.     Yes Historical Provider, MD  simvastatin (ZOCOR) 20 MG tablet Take 20 mg by mouth at bedtime.     Yes Historical Provider, MD    Allergies:  Allergies  Allergen Reactions  . Ace Inhibitors   . Atorvastatin     Social History:   reports that she has never smoked. She does not have any smokeless tobacco history on file. She reports  that she drinks alcohol. She reports that she does not use illicit drugs.  Family History: History reviewed. No pertinent family history.  Physical Exam: Filed Vitals:   07/13/11 1749 07/13/11 1939 07/13/11 2045  BP: 173/71 112/52 138/70  Pulse: 68 82 86  Temp: 98 F (36.7 C)  98.3 F (36.8 C)  TempSrc: Oral  Oral  Resp: 18 16   SpO2: 97% 99% 100%   Blood pressure 138/70, pulse 86, temperature 98.3 F (36.8 C), temperature source Oral, resp. rate 16, SpO2 100.00%.  Gen: Elderly but healthy appearing for her age, sprite and pleasant F in no distress able to give  history, appears well HEENT: PERRL, EOMI, sclera clear, mouth moist, normal appearing Neck: Supple, no elevated jugular pulsations, although a prominent carotid arterial pulsation is  noted at base of R neck Lungs: CTAB anterolaterally, deferred sitting her up. No adventitious lungs sounds Heart: RRR, no m/g, not tachy, quite benign  Abd: Soft, NT, ND, benign, not obese Extrem: Warm, well perfused, no cyanosis or coolness. Bilateral radial and DP's are palpable.  There's no BLE edema. Her R leg is grossly shorter than the left  Neuro: Alert, pleasant, coversant, moves upper extremities normally deferred leg exam though   Labs & Imaging Results for orders placed during the hospital encounter of 07/13/11 (from the past 48 hour(s))  CBC     Status: Abnormal   Collection Time   07/13/11  6:32 PM      Component Value Range Comment   WBC 9.9  4.0 - 10.5 (K/uL)    RBC 3.73 (*) 3.87 - 5.11 (MIL/uL)    Hemoglobin 11.7 (*) 12.0 - 15.0 (g/dL)    HCT 95.6  21.3 - 08.6 (%)    MCV 97.6  78.0 - 100.0 (fL)    MCH 31.4  26.0 - 34.0 (pg)    MCHC 32.1  30.0 - 36.0 (g/dL)    RDW 57.8  46.9 - 62.9 (%)    Platelets 199  150 - 400 (K/uL)   DIFFERENTIAL     Status: Abnormal   Collection Time   07/13/11  6:32 PM      Component Value Range Comment   Neutrophils Relative 79 (*) 43 - 77 (%)    Neutro Abs 7.9 (*) 1.7 - 7.7 (K/uL)     Lymphocytes Relative 13  12 - 46 (%)    Lymphs Abs 1.3  0.7 - 4.0 (K/uL)    Monocytes Relative 7  3 - 12 (%)    Monocytes Absolute 0.7  0.1 - 1.0 (K/uL)    Eosinophils Relative 1  0 - 5 (%)    Eosinophils Absolute 0.1  0.0 - 0.7 (K/uL)  Basophils Relative 0  0 - 1 (%)    Basophils Absolute 0.0  0.0 - 0.1 (K/uL)   BASIC METABOLIC PANEL     Status: Abnormal   Collection Time   07/13/11  6:32 PM      Component Value Range Comment   Sodium 141  135 - 145 (mEq/L)    Potassium 3.8  3.5 - 5.1 (mEq/L)    Chloride 103  96 - 112 (mEq/L)    CO2 24  19 - 32 (mEq/L)    Glucose, Bld 122 (*) 70 - 99 (mg/dL)    BUN 30 (*) 6 - 23 (mg/dL)    Creatinine, Ser 4.69  0.50 - 1.10 (mg/dL)    Calcium 9.9  8.4 - 10.5 (mg/dL)    GFR calc non Af Amer 60 (*) >90 (mL/min)    GFR calc Af Amer 69 (*) >90 (mL/min)    Dg Chest 1 View  07/13/2011  *RADIOLOGY REPORT*  Clinical Data: Right hip fracture  CHEST - 1 VIEW  Comparison: 10/19/2010  Findings: Left-sided pacer in place.  Moderate enlargement of the cardiomediastinal silhouette without edema.  Skin folds overlie the chest bilaterally.  No pleural effusion.  No focal pulmonary opacity. Bones are osteopenic, which may mask subtle fracture.  IMPRESSION: Cardiomegaly without focal acute cardiopulmonary process.  Original Report Authenticated By: Harrel Lemon, M.D.   Dg Pelvis 1-2 Views  07/13/2011  *RADIOLOGY REPORT*  Clinical Data: Fall.  Right hip fracture.  PELVIS - 1-2 VIEW  Comparison: 09/14/2010  Findings: Mildly comminuted right intertrochanteric and subtrochanteric fracture noted, with an intermediate free fragment which includes the lesser trochanter.  Varus angulation noted.  No fracture of the bony pelvis is observed.  Lower lumbar degenerative disc disease noted.  Atherosclerosis is present.  IMPRESSION:  1.  Mildly comminuted right intertrochanteric and subtrochanteric fracture. Per CMS PQRS reporting requirements (PQRS Measure 24): Given the  patient's age of greater than 50 and the fracture site (hip, distal radius, or spine), the patient should be tested for osteoporosis using DXA, and the appropriate treatment considered based on the DXA results. 2.  Lower lumbar degenerative disc disease and spondylosis.  Original Report Authenticated By: Dellia Cloud, M.D.   Dg Femur Right  07/13/2011  *RADIOLOGY REPORT*  Clinical Data: Fall.  Right hip pain.  RIGHT FEMUR - 2 VIEW  Comparison: Pelvis radiographs from 07/13/2011  Findings: A mildly comminuted right intertrochanteric fracture has subtrochanteric extension, and is believed to have a separate lesser tuberosity fragment which includes the subtrochanteric extension.  Varus angulation noted.  Bony demineralization is present.  Atherosclerotic vascular calcification is present.  Osteoarthritis of the knee noted.  IMPRESSION:  1.  Comminuted intertrochanteric and subtrochanteric fracture of the right proximal femur, with varus angulation.  Original Report Authenticated By: Dellia Cloud, M.D.   Echo 08/2006  SUMMARY  -  Overall left ventricular systolic function was normal. Left        ventricular ejection fraction was estimated to be 50 %. There        were no left ventricular regional wall motion abnormalities.        Left ventricular wall thickness was moderately to markedly        increased. There was dyssynergic motion of the        interventricular septum, consistent with a conduction        abnormality or paced rhythm.  -  There was mild to moderate aortic valvular regurgitation.  -  There was mild mitral valvular regurgitation.  -  Estimated peak pulmonary artery systolic pressure 25 mmHg.  Impression Present on Admission:  .Femur fracture, right .Pacemaker .Aortic valve regurgitation .Mitral valve disorder   89yoF with h/o complete heart block s/p dual chamber pacemaker, abdominal aortic aneurysm,  congenital heart disease (unclear what, pt denies), now  presents with R femur fracture after mechanical fall.    1. Femur fracture: Was a pure mechanical fall in nature, no preceding presycnopal symptoms.  According to the 2007 ACC/AHA guidelines, the patient has no active cardiac conditions precluding  surgery (no HF, unstable ACS, no significant arrhythmias, no known severe valvular disease). She  is able to tolerate >4 METS of activity as evidenced by caring for her elderly husband at home,  and denies angina, palpitations, dyspnea with exertional activities. Therefore she is a  low risk for an intermediate risk surgery and can proceed with surgery.   According to the [facility] Revised Cardiac Risk Index, the patient has none of the 6 independent predictors of perioperative cardiac complications:   1) High-risk surgery (includes any intraperitoneal, intrathoracic, suprainguinal, or peripheral  arterial vascular procedures) 2) History of ischemic heart disease (h/o MI or positive stress test, current CP thought due to  cardiac angina, use of nitrate therapy, or pathological Q waves on EKG, but NOT including  revascularization) 3) History of Heart Failure 4) Stroke 5) preoperative treatment with insulin (eg IDDM) 6) Creatinine >2.0 mg/dL  This patient has 0 risk factors, and hence has a 0.4% risk of perioperative cardiac complications. Of note, there is a history of heart failure noted in the chart, however last echo 2008 did not support this, so this is a bit unclear. E-chart also indicates h/o AR and MR but per prior echo these were mild to moderate and do not apparently affect her exercise tolerance. Her pulmonary pressures were only 25 at that time anyways. I don't see a need for repeat echo.   - Pt to have Buck's traction applied and Ortho to see, strict bed rest  - NPO, will need EKG, type and screen, coags  - PT consult  - Lovenox prophylaxis, GFR 60  - Gentle IVF's, place Foley  - Tylenol and Ibuprofen for pain control per patient  request to avoid narcotics and allergy to "mind altered substances" - Incentive spirometry perioperatively  - PCP f/u for DEXA scanning per radiology  2. Medication reconciliation:  - Continue home ASA 81 mg daily, Coreg, Losartan, simvastatin  - Hold home Lasix  - Holding home non-essential meds   Regular bed, will admit to Georgann Housekeeper, pt's PCP  DNR / DNI, has paperwork with her. Made aware of reversal for surgery which she understands.   Other plans as per orders.  Westlee Devita 07/13/2011, 9:07 PM

## 2011-07-13 NOTE — ED Notes (Signed)
PT DENIES NEED FOR MED AT PRESENT.

## 2011-07-13 NOTE — ED Provider Notes (Signed)
History     CSN: [id number] Arrival date & time: 07/13/2011  5:34 PM   First MD Initiated Contact with Patient 07/13/11 1743      No chief complaint on file.   (Consider location/radiation/quality/duration/timing/severity/associated sxs/prior treatment) Patient is a 77 y.o. female presenting with fall. The history is provided by the patient and the EMS personnel.  Fall The accident occurred less than 1 hour ago. The fall occurred while walking. She fell from a height of 3 to 5 ft. There was no blood loss. The point of impact was the right hip. The pain is present in the right hip. The pain is moderate. She was not ambulatory at the scene. There was no entrapment after the fall. There was no drug use involved in the accident. There was no alcohol use involved in the accident. Pertinent negatives include no visual change, no fever, no abdominal pain, no nausea, no vomiting and no hematuria. The symptoms are aggravated by activity. She has tried nothing for the symptoms.    Past Medical History  Diagnosis Date  . Aortic valve regurgitation   . Congenital heart disease   . Left bundle branch block   . Hyperlipidemia   . Mitral valve disorder   . Pacemaker   . Dyspnea   . Anxiety   . Depression     Past Surgical History  Procedure Date  . Knee arthroscopy   . Pacemaker insertion     No family history on file.  History  Substance Use Topics  . Smoking status: Never Smoker   . Smokeless tobacco: Not on file  . Alcohol Use: Yes    OB History    Grav Para Term Preterm Abortions TAB SAB Ect Mult Living                  Review of Systems  Constitutional: Negative for fever.  Cardiovascular: Negative for chest pain.  Gastrointestinal: Negative for nausea, vomiting, abdominal pain and diarrhea.  Genitourinary: Negative for hematuria.  Musculoskeletal: Negative for back pain.  All other systems reviewed and are negative.    Allergies  Ace inhibitors and  Atorvastatin  Home Medications   Current Outpatient Rx  Name Route Sig Dispense Refill  . ACETAMINOPHEN 500 MG PO TABS Oral Take 500 mg by mouth as needed.      . ASPIRIN 81 MG PO TABS Oral Take 81 mg by mouth daily.      Marland Kitchen CARVEDILOL 6.25 MG PO TABS Oral Take 6.25 mg by mouth 2 (two) times daily.      Marland Kitchen VITAMIN D 1000 UNITS PO TABS Oral Take 1,000 Units by mouth daily.      . FUROSEMIDE 20 MG PO TABS Oral Take 20 mg by mouth 2 (two) times daily.      Marland Kitchen GLUCOSAMINE 500 MG PO CAPS Oral Take by mouth. 3000 mg daily?     Marland Kitchen LOSARTAN POTASSIUM 50 MG PO TABS Oral Take 50 mg by mouth daily.      . LUTEIN 20 MG PO CAPS Oral Take by mouth daily.      . MULTIVITAL PO TABS Oral Take 1 tablet by mouth daily.     Marland Kitchen OMEPRAZOLE 20 MG PO CPDR Oral Take 20 mg by mouth daily.      . OXYBUTYNIN CHLORIDE ER 10 MG PO TB24 Oral Take 10 mg by mouth daily.      Marland Kitchen SIMVASTATIN 20 MG PO TABS Oral Take 20 mg by mouth at bedtime.  BP 173/71  Pulse 68  Temp(Src) 98 F (36.7 C) (Oral)  Resp 18  SpO2 97%  Physical Exam  Nursing note and vitals reviewed. Constitutional: She is oriented to person, place, and time. She appears well-developed and well-nourished. No distress.  HENT:  Head: Normocephalic and atraumatic.  Eyes: Conjunctivae are normal. Pupils are equal, round, and reactive to light.  Neck: Normal range of motion. Neck supple.  Cardiovascular: Normal rate and regular rhythm.   Pulmonary/Chest: Effort normal and breath sounds normal. She exhibits no tenderness.  Abdominal: Soft. There is no tenderness.  Musculoskeletal: Normal range of motion. She exhibits tenderness (TTP of R hip, externally rotated, NV intact distally).  Neurological: She is alert and oriented to person, place, and time. No cranial nerve deficit.  Skin: Skin is warm and dry. No erythema.    ED Course  Procedures (including critical care time)  Labs Reviewed  CBC - Abnormal; Notable for the following:    RBC 3.73 (*)     Hemoglobin 11.7 (*)    All other components within normal limits  DIFFERENTIAL - Abnormal; Notable for the following:    Neutrophils Relative 79 (*)    Neutro Abs 7.9 (*)    All other components within normal limits  BASIC METABOLIC PANEL - Abnormal; Notable for the following:    Glucose, Bld 122 (*)    BUN 30 (*)    GFR calc non Af Amer 60 (*)    GFR calc Af Amer 69 (*)    All other components within normal limits   Dg Chest 1 View  07/13/2011  *RADIOLOGY REPORT*  Clinical Data: Right hip fracture  CHEST - 1 VIEW  Comparison: 10/19/2010  Findings: Left-sided pacer in place.  Moderate enlargement of the cardiomediastinal silhouette without edema.  Skin folds overlie the chest bilaterally.  No pleural effusion.  No focal pulmonary opacity. Bones are osteopenic, which may mask subtle fracture.  IMPRESSION: Cardiomegaly without focal acute cardiopulmonary process.  Original Report Authenticated By: Harrel Lemon, M.D.   Dg Pelvis 1-2 Views  07/13/2011  *RADIOLOGY REPORT*  Clinical Data: Fall.  Right hip fracture.  PELVIS - 1-2 VIEW  Comparison: 09/14/2010  Findings: Mildly comminuted right intertrochanteric and subtrochanteric fracture noted, with an intermediate free fragment which includes the lesser trochanter.  Varus angulation noted.  No fracture of the bony pelvis is observed.  Lower lumbar degenerative disc disease noted.  Atherosclerosis is present.  IMPRESSION:  1.  Mildly comminuted right intertrochanteric and subtrochanteric fracture. Per CMS PQRS reporting requirements (PQRS Measure 24): Given the patient's age of greater than 50 and the fracture site (hip, distal radius, or spine), the patient should be tested for osteoporosis using DXA, and the appropriate treatment considered based on the DXA results. 2.  Lower lumbar degenerative disc disease and spondylosis.  Original Report Authenticated By: Dellia Cloud, M.D.   Dg Femur Right  07/13/2011  *RADIOLOGY REPORT*   Clinical Data: Fall.  Right hip pain.  RIGHT FEMUR - 2 VIEW  Comparison: Pelvis radiographs from 07/13/2011  Findings: A mildly comminuted right intertrochanteric fracture has subtrochanteric extension, and is believed to have a separate lesser tuberosity fragment which includes the subtrochanteric extension.  Varus angulation noted.  Bony demineralization is present.  Atherosclerotic vascular calcification is present.  Osteoarthritis of the knee noted.  IMPRESSION:  1.  Comminuted intertrochanteric and subtrochanteric fracture of the right proximal femur, with varus angulation.  Original Report Authenticated By: Dellia Cloud, M.D.  1. Hip fracture       MDM  Pt presented after mechanical fall with R hip pain.  Xray showed R hip fx.  Pt has seen Dr. Priscille Kluver.  Consulted Dr. Eulah Pont who is covering for him.  He reports they will see her tomorrow and request medicine c/s for admission.  Medicine to admit.          Nena Alexander, MD 07/13/11 2015

## 2011-07-13 NOTE — ED Notes (Signed)
Per ems- pt fell today while helping Husband clean up. Pt has right hip pain, rotation, shortness, and deformity. Vitals stable with EMS. Arrived with R forearm 18g saline lock.

## 2011-07-13 NOTE — ED Notes (Signed)
Pt states that she is not in severe pain. Pain only when she moves. Pt repositioned to left side prompted by pillows. Pt aware that she can have pain medication. Pt refused morphine after hearing benefits to medication. Pt aware she is waiting for an ortho consult.

## 2011-07-13 NOTE — ED Notes (Signed)
ORTHO TECH AWARE THAT PT NEEDS BUCKS TRACTION ON FLOOR AND ALSO RN ON  FLOOR AWARE PT IS NOT A TELE PT AND THAT NEEDS BUCKS TRACTION.

## 2011-07-14 ENCOUNTER — Encounter (HOSPITAL_COMMUNITY): Payer: Self-pay | Admitting: Anesthesiology

## 2011-07-14 ENCOUNTER — Inpatient Hospital Stay (HOSPITAL_COMMUNITY): Payer: Medicare Other

## 2011-07-14 ENCOUNTER — Inpatient Hospital Stay (HOSPITAL_COMMUNITY): Payer: Medicare Other | Admitting: Anesthesiology

## 2011-07-14 ENCOUNTER — Other Ambulatory Visit: Payer: Self-pay

## 2011-07-14 ENCOUNTER — Encounter (HOSPITAL_COMMUNITY): Payer: Self-pay | Admitting: *Deleted

## 2011-07-14 ENCOUNTER — Encounter (HOSPITAL_COMMUNITY)
Admission: EM | Disposition: A | Discharge status: Skilled Nursing Facility | Payer: Self-pay | Source: Home / Self Care | Attending: Internal Medicine

## 2011-07-14 DIAGNOSIS — I10 Essential (primary) hypertension: Secondary | ICD-10-CM | POA: Diagnosis present

## 2011-07-14 HISTORY — PX: FEMUR IM NAIL: SHX1597

## 2011-07-14 LAB — CBC
HCT: 26.6 % — ABNORMAL LOW (ref 36.0–46.0)
Hemoglobin: 8.8 g/dL — ABNORMAL LOW (ref 12.0–15.0)
MCH: 32.1 pg (ref 26.0–34.0)
MCV: 97.1 fL (ref 78.0–100.0)
RBC: 2.74 MIL/uL — ABNORMAL LOW (ref 3.87–5.11)
WBC: 5.6 10*3/uL (ref 4.0–10.5)

## 2011-07-14 LAB — BASIC METABOLIC PANEL
CO2: 26 mEq/L (ref 19–32)
Chloride: 107 mEq/L (ref 96–112)
Glucose, Bld: 132 mg/dL — ABNORMAL HIGH (ref 70–99)
Potassium: 3.6 mEq/L (ref 3.5–5.1)
Sodium: 141 mEq/L (ref 135–145)

## 2011-07-14 LAB — ABO/RH: ABO/RH(D): O POS

## 2011-07-14 LAB — SURGICAL PCR SCREEN: MRSA, PCR: NEGATIVE

## 2011-07-14 SURGERY — INSERTION, INTRAMEDULLARY ROD, FEMUR
Anesthesia: General | Site: Hip | Laterality: Right | Wound class: Clean

## 2011-07-14 MED ORDER — ACETAMINOPHEN 325 MG PO TABS: 650.0000 mg | ORAL_TABLET | Freq: Four times a day (QID) | ORAL | Status: DC | PRN

## 2011-07-14 MED ORDER — NEOSTIGMINE METHYLSULFATE 1 MG/ML IJ SOLN
INTRAMUSCULAR | Status: DC | PRN
  Administered 2011-07-14: 3 mg via INTRAVENOUS

## 2011-07-14 MED ORDER — POLYETHYLENE GLYCOL 3350 17 G PO PACK
17.0000 g | PACK | Freq: Every day | ORAL | Status: DC | PRN
  Filled 2011-07-14: qty 1

## 2011-07-14 MED ORDER — GLYCOPYRROLATE 0.2 MG/ML IJ SOLN
INTRAMUSCULAR | Status: DC | PRN
  Administered 2011-07-14: .5 mg via INTRAVENOUS

## 2011-07-14 MED ORDER — CALCIUM CARBONATE-VITAMIN D 500-200 MG-UNIT PO TABS: 1.0000 | ORAL_TABLET | Freq: Every day | ORAL | Status: DC

## 2011-07-14 MED ORDER — PROPOFOL 10 MG/ML IV EMUL
INTRAVENOUS | Status: DC | PRN
  Administered 2011-07-14: 100 mg via INTRAVENOUS

## 2011-07-14 MED ORDER — SENNOSIDES-DOCUSATE SODIUM 8.6-50 MG PO TABS: 1.0000 | ORAL_TABLET | Freq: Every evening | ORAL | Status: DC | PRN

## 2011-07-14 MED ORDER — METHOCARBAMOL 100 MG/ML IJ SOLN
500.0000 mg | Freq: Four times a day (QID) | INTRAVENOUS | Status: DC | PRN
  Filled 2011-07-14: qty 5

## 2011-07-14 MED ORDER — MENTHOL 3 MG MT LOZG: 1.0000 | LOZENGE | OROMUCOSAL | Status: DC | PRN

## 2011-07-14 MED ORDER — WARFARIN VIDEO
Freq: Once | Status: DC
  Filled 2011-07-14: qty 1

## 2011-07-14 MED ORDER — ENOXAPARIN SODIUM 40 MG/0.4ML ~~LOC~~ SOLN
40.0000 mg | Freq: Every day | SUBCUTANEOUS | Status: DC
  Administered 2011-07-14 – 2011-07-15 (×2): 40 mg via SUBCUTANEOUS
  Filled 2011-07-14 (×3): qty 0.4

## 2011-07-14 MED ORDER — DROPERIDOL 2.5 MG/ML IJ SOLN
0.6250 mg | INTRAMUSCULAR | Status: DC | PRN
  Filled 2011-07-14: qty 0.25

## 2011-07-14 MED ORDER — LACTATED RINGERS IV SOLN
INTRAVENOUS | Status: DC
  Administered 2011-07-14: 15:00:00 via INTRAVENOUS

## 2011-07-14 MED ORDER — PANTOPRAZOLE SODIUM 40 MG PO TBEC
40.0000 mg | DELAYED_RELEASE_TABLET | Freq: Every day | ORAL | Status: DC
  Administered 2011-07-15 – 2011-07-17 (×3): 40 mg via ORAL
  Filled 2011-07-14 (×3): qty 1

## 2011-07-14 MED ORDER — HYDROCODONE-ACETAMINOPHEN 5-325 MG PO TABS: 1.0000 | ORAL_TABLET | ORAL | Status: DC | PRN

## 2011-07-14 MED ORDER — ENOXAPARIN SODIUM 30 MG/0.3ML ~~LOC~~ SOLN: 40.0000 mg | SUBCUTANEOUS | Status: DC

## 2011-07-14 MED ORDER — ONDANSETRON HCL 4 MG/2ML IJ SOLN
INTRAMUSCULAR | Status: DC | PRN
  Administered 2011-07-14: 4 mg via INTRAVENOUS

## 2011-07-14 MED ORDER — LACTATED RINGERS IV SOLN
INTRAVENOUS | Status: DC | PRN
  Administered 2011-07-14: 17:00:00 via INTRAVENOUS

## 2011-07-14 MED ORDER — TRAMADOL HCL 50 MG PO TABS
50.0000 mg | ORAL_TABLET | Freq: Four times a day (QID) | ORAL | Status: DC | PRN
Start: 2011-07-14 — End: 2011-07-15
  Administered 2011-07-15: 50 mg via ORAL
  Filled 2011-07-14 (×2): qty 1

## 2011-07-14 MED ORDER — SODIUM CHLORIDE 0.9 % IR SOLN
Status: DC | PRN
  Administered 2011-07-14: 1000 mL

## 2011-07-14 MED ORDER — VITAMIN D3 25 MCG (1000 UNIT) PO TABS
1000.0000 [IU] | ORAL_TABLET | Freq: Every day | ORAL | Status: DC
  Administered 2011-07-14 – 2011-07-18 (×5): 1000 [IU] via ORAL
  Filled 2011-07-14 (×6): qty 1

## 2011-07-14 MED ORDER — METOCLOPRAMIDE HCL 5 MG/ML IJ SOLN
5.0000 mg | Freq: Three times a day (TID) | INTRAMUSCULAR | Status: DC | PRN
  Filled 2011-07-14: qty 2

## 2011-07-14 MED ORDER — CEFAZOLIN SODIUM 1-5 GM-% IV SOLN
1.0000 g | Freq: Four times a day (QID) | INTRAVENOUS | Status: AC
Start: 2011-07-14 — End: 2011-07-15
  Administered 2011-07-14 – 2011-07-15 (×3): 1 g via INTRAVENOUS
  Filled 2011-07-14 (×4): qty 50

## 2011-07-14 MED ORDER — ONDANSETRON HCL 4 MG PO TABS: 4.0000 mg | ORAL_TABLET | Freq: Four times a day (QID) | ORAL | Status: DC | PRN

## 2011-07-14 MED ORDER — DEXAMETHASONE SODIUM PHOSPHATE 4 MG/ML IJ SOLN
INTRAMUSCULAR | Status: DC | PRN
  Administered 2011-07-14: 8 mg via INTRAVENOUS

## 2011-07-14 MED ORDER — METOCLOPRAMIDE HCL 10 MG PO TABS
5.0000 mg | ORAL_TABLET | Freq: Three times a day (TID) | ORAL | Status: DC | PRN
Start: 2011-07-14 — End: 2011-07-18

## 2011-07-14 MED ORDER — HYDROMORPHONE HCL PF 1 MG/ML IJ SOLN
0.2500 mg | INTRAMUSCULAR | Status: DC | PRN
  Administered 2011-07-14 (×4): 0.25 mg via INTRAVENOUS

## 2011-07-14 MED ORDER — ACETAMINOPHEN 650 MG RE SUPP
650.0000 mg | Freq: Four times a day (QID) | RECTAL | Status: DC | PRN
Start: 2011-07-14 — End: 2011-07-15

## 2011-07-14 MED ORDER — DOCUSATE SODIUM 100 MG PO CAPS
100.0000 mg | ORAL_CAPSULE | Freq: Two times a day (BID) | ORAL | Status: DC
  Administered 2011-07-15 – 2011-07-18 (×5): 100 mg via ORAL
  Filled 2011-07-14 (×8): qty 1

## 2011-07-14 MED ORDER — ONDANSETRON HCL 4 MG/2ML IJ SOLN: 4.0000 mg | Freq: Four times a day (QID) | INTRAMUSCULAR | Status: DC | PRN

## 2011-07-14 MED ORDER — POTASSIUM CHLORIDE IN NACL 20-0.45 MEQ/L-% IV SOLN
INTRAVENOUS | Status: DC
  Filled 2011-07-14 (×6): qty 1000

## 2011-07-14 MED ORDER — WARFARIN SODIUM 5 MG PO TABS
5.0000 mg | ORAL_TABLET | Freq: Once | ORAL | Status: AC
  Administered 2011-07-14: 5 mg via ORAL
  Filled 2011-07-14: qty 1

## 2011-07-14 MED ORDER — METHOCARBAMOL 500 MG PO TABS: 500.0000 mg | ORAL_TABLET | Freq: Four times a day (QID) | ORAL | Status: DC | PRN

## 2011-07-14 MED ORDER — BISACODYL 10 MG RE SUPP: 10.0000 mg | Freq: Every day | RECTAL | Status: DC | PRN

## 2011-07-14 MED ORDER — PHENOL 1.4 % MT LIQD
1.0000 | OROMUCOSAL | Status: DC | PRN
  Administered 2011-07-14: 1 via OROMUCOSAL
  Filled 2011-07-14: qty 177

## 2011-07-14 MED ORDER — ROCURONIUM BROMIDE 100 MG/10ML IV SOLN
INTRAVENOUS | Status: DC | PRN
  Administered 2011-07-14: 35 mg via INTRAVENOUS

## 2011-07-14 MED ORDER — WARFARIN SODIUM 5 MG PO TABS: 5.0000 mg | ORAL_TABLET | Freq: Every day | ORAL | Status: DC

## 2011-07-14 MED ORDER — PHENYLEPHRINE HCL 10 MG/ML IJ SOLN
10.0000 mg | INTRAVENOUS | Status: DC | PRN
  Administered 2011-07-14: 5 ug via INTRAVENOUS

## 2011-07-14 MED ORDER — FENTANYL CITRATE 0.05 MG/ML IJ SOLN
INTRAMUSCULAR | Status: DC | PRN
  Administered 2011-07-14 (×4): 25 ug via INTRAVENOUS
  Administered 2011-07-14 (×2): 50 ug via INTRAVENOUS

## 2011-07-14 MED ORDER — COUMADIN BOOK
Freq: Once | Status: AC
  Administered 2011-07-15
  Filled 2011-07-14: qty 1

## 2011-07-14 MED ORDER — MAGNESIUM HYDROXIDE 400 MG/5ML PO SUSP: 30.0000 mL | Freq: Two times a day (BID) | ORAL | Status: DC | PRN

## 2011-07-14 MED ORDER — BISACODYL 5 MG PO TBEC: 10.0000 mg | DELAYED_RELEASE_TABLET | Freq: Every day | ORAL | Status: DC | PRN

## 2011-07-14 MED ORDER — FLEET ENEMA 7-19 GM/118ML RE ENEM: 1.0000 | ENEMA | Freq: Every day | RECTAL | Status: DC | PRN

## 2011-07-14 SURGICAL SUPPLY — 45 items
APL SKNCLS STERI-STRIP NONHPOA (GAUZE/BANDAGES/DRESSINGS) ×1
BENZOIN TINCTURE PRP APPL 2/3 (GAUZE/BANDAGES/DRESSINGS) ×2 IMPLANT
BIT DRILL 4.0X195MM (BIT) IMPLANT
BLADE HELICAL 90 (Orthopedic Implant) ×1 IMPLANT
BOOTCOVER CLEANROOM LRG (PROTECTIVE WEAR) ×2 IMPLANT
CLOTH BEACON ORANGE TIMEOUT ST (SAFETY) ×2 IMPLANT
COVER SURGICAL LIGHT HANDLE (MISCELLANEOUS) ×2 IMPLANT
DRAPE STERI IOBAN 125X83 (DRAPES) ×2 IMPLANT
DRILL BIT 4.0X195MM (BIT) ×1
DRSG MEPILEX BORDER 4X4 (GAUZE/BANDAGES/DRESSINGS) ×3 IMPLANT
DRSG MEPILEX BORDER 4X8 (GAUZE/BANDAGES/DRESSINGS) ×3 IMPLANT
DURAPREP 26ML APPLICATOR (WOUND CARE) ×2 IMPLANT
ELECT CAUTERY BLADE 6.4 (BLADE) ×2 IMPLANT
ELECT REM PT RETURN 9FT ADLT (ELECTROSURGICAL) ×2
ELECTRODE REM PT RTRN 9FT ADLT (ELECTROSURGICAL) ×1 IMPLANT
EVACUATOR 1/8 PVC DRAIN (DRAIN) IMPLANT
FACESHIELD LNG OPTICON STERILE (SAFETY) ×2 IMPLANT
GAUZE XEROFORM 5X9 LF (GAUZE/BANDAGES/DRESSINGS) ×1 IMPLANT
GLOVE BIOGEL PI IND STRL 8 (GLOVE) ×1 IMPLANT
GLOVE BIOGEL PI INDICATOR 8 (GLOVE) ×2
GLOVE ORTHO TXT STRL SZ7.5 (GLOVE) ×4 IMPLANT
GLOVE SURG ORTHO 8.0 STRL STRW (GLOVE) ×2 IMPLANT
GOWN STRL NON-REIN LRG LVL3 (GOWN DISPOSABLE) ×3 IMPLANT
GUIDEWIRE 3.2X290 (WIRE) ×1 IMPLANT
KIT ROOM TURNOVER OR (KITS) ×2 IMPLANT
MANIFOLD NEPTUNE II (INSTRUMENTS) ×1 IMPLANT
NAIL TROCH TI 11 130X340 (Nail) ×1 IMPLANT
NS IRRIG 1000ML POUR BTL (IV SOLUTION) ×2 IMPLANT
PACK GENERAL/GYN (CUSTOM PROCEDURE TRAY) ×2 IMPLANT
PAD ARMBOARD 7.5X6 YLW CONV (MISCELLANEOUS) ×4 IMPLANT
REAMER ROD DEEP FLUTE 2.5X950 (INSTRUMENTS) ×1 IMPLANT
SCREW LOCK TI 5.0X38 F/IM NAIL (Screw) ×1 IMPLANT
STAPLER VISISTAT 35W (STAPLE) ×1 IMPLANT
STRIP CLOSURE SKIN 1/2X4 (GAUZE/BANDAGES/DRESSINGS) ×2 IMPLANT
SUT MNCRL AB 4-0 PS2 18 (SUTURE) ×1 IMPLANT
SUT VIC AB 0 CT1 27 (SUTURE) ×2
SUT VIC AB 0 CT1 27XBRD ANBCTR (SUTURE) IMPLANT
SUT VIC AB 0 CTB1 27 (SUTURE) ×1 IMPLANT
SUT VIC AB 2-0 FS1 27 (SUTURE) ×1 IMPLANT
SUT VIC AB 2-0 SH 27 (SUTURE)
SUT VIC AB 2-0 SH 27XBRD (SUTURE) IMPLANT
SUT VIC AB 3-0 SH 8-18 (SUTURE) ×2 IMPLANT
TOWEL OR 17X24 6PK STRL BLUE (TOWEL DISPOSABLE) ×2 IMPLANT
TOWEL OR 17X26 10 PK STRL BLUE (TOWEL DISPOSABLE) ×2 IMPLANT
WATER STERILE IRR 1000ML POUR (IV SOLUTION) ×1 IMPLANT

## 2011-07-14 NOTE — Anesthesia Preprocedure Evaluation (Addendum)
Anesthesia Evaluation  Patient identified by MRN, date of birth, ID band Patient awake    Airway Mallampati: II TM Distance: >3 FB     Dental  (+) Teeth Intact and Dental Advisory Given   Pulmonary shortness of breath and with exertion,  clear to auscultation        Cardiovascular hypertension, +CHF Regular Normal- Systolic murmurs    Neuro/Psych PSYCHIATRIC DISORDERS    GI/Hepatic   Endo/Other    Renal/GU      Musculoskeletal   Abdominal   Peds  Hematology   Anesthesia Other Findings   Reproductive/Obstetrics                          Anesthesia Physical Anesthesia Plan  ASA: III  Anesthesia Plan: General   Post-op Pain Management:    Induction: Intravenous  Airway Management Planned: Oral ETT  Additional Equipment:   Intra-op Plan:   Post-operative Plan:   Informed Consent: I have reviewed the patients History and Physical, chart, labs and discussed the procedure including the risks, benefits and alternatives for the proposed anesthesia with the patient or authorized representative who has indicated his/her understanding and acceptance.     Plan Discussed with: CRNA and Surgeon  Anesthesia Plan Comments:         Anesthesia Quick Evaluation

## 2011-07-14 NOTE — Op Note (Signed)
DATE OF SURGERY:  07/14/2011  TIME: 7:38 PM  PATIENT NAME:  [name]  AGE: 77 y.o.  PRE-OPERATIVE DIAGNOSIS:  right hip fracture  POST-OPERATIVE DIAGNOSIS:  SAME  PROCEDURE:  INTRAMEDULLARY (IM) NAIL FEMORAL  SURGEON:  Julieth Tugman P  OPERATIVE IMPLANTS: Synthes trochanteric femoral nail with interlocking helical blade into the femoral head and a single distal interlocking bolt placed in the proximal aspect of the dynamic slot.  PREOPERATIVE INDICATIONS:  [name] is a 77 y.o. year old who fell and suffered a hip fracture. She was brought into the ER and then admitted and optimized and then elected for surgical intervention.    The risks benefits and alternatives were discussed with the patient including but not limited to the risks of nonoperative treatment, versus surgical intervention including infection, bleeding, nerve injury, malunion, nonunion, hardware prominence, hardware failure, need for hardware removal, blood clots, cardiopulmonary complications, morbidity, mortality, among others, and they were willing to proceed.  Predicted outcome is good, although there will be at least a six to nine month expected recovery.  OPERATIVE PROCEDURE:  The patient was brought to the operating room and placed in the supine position. General anesthesia was administered, no Foley was placed, based on the patient's request. She was placed on the fracture table.  Closed reduction was performed under C-arm guidance. The length of the femur was also measured using fluoroscopy. Time out was then performed after sterile prep and drape. She received preoperative Ancef.  Incision was made proximal to the greater trochanter. A guidewire was placed in the appropriate position. Confirmation was made on AP and lateral views. The above-named nail was opened. I opened the proximal femur with a reamer. I then placed the nail by hand easily down. I did not need to ream the femur.  Once the nail  was completely seated, I placed a guidepin into the femoral head into the center center position. I measured the length, and then reamed the lateral cortex and up into the head. I then placed the helical blade. Slight compression was applied. Anatomic fixation achieved. Bone quality was mediocre.  I then secured the proximal interlocking bolt, and took off a half a turn, and then removed the instruments, and then turned my attention to the distal femur. Perfect circles were utilized, and a single interlocking bolt was placed in the proximal aspect of the dynamic slot, and I did not place a second screw proximally, in order to minimize stress riser risk.  I took final C-arm pictures AP and lateral the entire length of the leg. Anatomic reconstruction was achieved, and the wounds were irrigated copiously and closed with Vicryl followed by Steri-Strips and sterile gauze for the skin. The patient was awakened and returned to PACU in stable and satisfactory condition. There no complications and she tolerated the procedure well.  She be weightbearing as tolerated, and will be on Lovenox bridging to Coumadin for a period of one month with a goal INR of 2-3.    Teryl Lucy, M.D.

## 2011-07-14 NOTE — ED Provider Notes (Signed)
I saw and evaluated the patient, reviewed the resident's note and I agree with the findings and plan.  MSK: R hip TTP, NVI  Cyndra Numbers, MD 07/14/11 0006

## 2011-07-14 NOTE — Progress Notes (Signed)
  Subjective: Denies signigicant pain Awaiting hip surg. Medically stable.  Objective: Vital signs in last 24 hours: Temp:  [98 F (36.7 C)-98.4 F (36.9 C)] 98.4 F (36.9 C) (11/18 2115) Pulse Rate:  [68-94] 94  (11/18 2115) Resp:  [16-20] 20  (11/18 2115) BP: (112-184)/(52-73) 184/73 mmHg (11/18 2115) SpO2:  [97 %-100 %] 98 % (11/18 2115) Weight:  [54.7 kg (120 lb 9.5 oz)] 120 lb 9.5 oz (54.7 kg) (11/19 0208)   Intake/Output from previous day: 11/18 0701 - 11/19 0700 In: 633.9 [I.V.:633.9] Out: 200 [Urine:200]    General appearance: alert and cooperative Resp: clear to auscultation bilaterally Cardio: regular rate and rhythm, S1, S2 normal,1/6 murmur, click, rub or gallop Extremities: extremities normal, atraumatic, no cyanosis or edema Neurologic: Grossly normal  Lab Results:  [facility] 07/13/11 1832  WBC 9.9  HGB 11.7*  HCT 36.4  PLT 199   BMET  Basename 07/13/11 1832  NA 141  K 3.8  CL 103  CO2 24  GLUCOSE 122*  BUN 30*  CREATININE 0.84  CALCIUM 9.9   Lab Results  Component Value Date   ALT 14 10/05/2010   AST 23 10/05/2010   ALKPHOS 50 10/05/2010   BILITOT 0.8 10/05/2010    Assessment/Plan:  Principal Problem:  *Femur fracture, right Per ortho- surg Pain control-   Sensitive to meds. Tramadol prn Medically stable for Surg Active Problems:  Aortic valve regurgitation CHF: stable.  Slow hydration.  Mitral valve disorder  Pacemaker Sinus ryhtym  Hypertension Continue med.  Hold lasix for 1-2 days.   Corona Popovich 07/14/2011, 6:13 AM

## 2011-07-14 NOTE — Progress Notes (Signed)
PT order received. Pt is on strict bedrest and is also on traction in bed. Pt is pending ortho consult and awaiting surgery. Pt is not appropriate for therapy at this time. Please reorder therapy after surgery or when activity level increased to OOB.

## 2011-07-14 NOTE — Consult Note (Signed)
Reason for Consult: Right hip pain Referring Physician: [facility] hospitalist service  [name] is an 77 y.o. female.  HPI: 77 year old woman who fell yesterday while trying to care for her husband. This is a mechanical fall. She is unable to walk. She came into the hospital for evaluation. Pain is located directly over right hip. Worse with ambulation. She was given some anti-inflammatories with significant improvement. She has never had an injury like this before. Pain is rated as minimal at rest, and moderate to severe with activity. This is described as a dull ache.  Past Medical History  Diagnosis Date  . Aortic valve regurgitation   . Congenital heart disease   . Left bundle branch block   . Hyperlipidemia   . Mitral valve disorder   . Pacemaker     For complete heart block, s/p generator change 09/2010   . Dyspnea   . Anxiety   . Depression   . Aneurysm of abdominal aorta     4.4 cm in 09/2010    Past Surgical History  Procedure Date  . Knee arthroscopy   . Pacemaker insertion   . Insert / replace / remove pacemaker     History reviewed. No pertinent family history. mother and father died in their 69s. There was a history of cardiac disease.  Social History:  reports that she has never smoked. She does not have any smokeless tobacco history on file. She reports that she drinks alcohol. She reports that she does not use illicit drugs.  Allergies:  Allergies  Allergen Reactions  . Ace Inhibitors   . Atorvastatin     Medications:  Prior to Admission:  Prescriptions prior to admission  Medication Sig Dispense Refill  . acetaminophen (TYLENOL) 500 MG tablet Take 500 mg by mouth as needed.        Marland Kitchen aspirin 81 MG tablet Take 81 mg by mouth daily.        . beta carotene w/minerals (OCUVITE) tablet Take 1 tablet by mouth daily.        . carvedilol (COREG) 6.25 MG tablet Take 6.25 mg by mouth 2 (two) times daily.        . cholecalciferol (VITAMIN D) 1000 UNITS tablet  Take 1,000 Units by mouth daily.        . furosemide (LASIX) 20 MG tablet Take 20 mg by mouth 2 (two) times daily.        . Glucosamine 500 MG CAPS Take by mouth. 3000 mg daily?      . losartan (COZAAR) 50 MG tablet Take 25 mg by mouth daily.       . Lutein 20 MG CAPS Take 20 mg by mouth daily.       . meclizine (ANTIVERT) 25 MG tablet Take 25 mg by mouth 3 (three) times daily as needed. Nausea/dizziness       . Multiple Vitamins-Minerals (MULTIVITAL) tablet Take 1 tablet by mouth daily.       Marland Kitchen omeprazole (PRILOSEC) 20 MG capsule Take 20 mg by mouth daily.        Marland Kitchen oxybutynin (DITROPAN-XL) 10 MG 24 hr tablet Take 10 mg by mouth daily.        . simvastatin (ZOCOR) 20 MG tablet Take 20 mg by mouth at bedtime.          Results for orders placed during the hospital encounter of 07/13/11 (from the past 48 hour(s))  CBC     Status: Abnormal   Collection Time  07/13/11  6:32 PM      Component Value Range Comment   WBC 9.9  4.0 - 10.5 (K/uL)    RBC 3.73 (*) 3.87 - 5.11 (MIL/uL)    Hemoglobin 11.7 (*) 12.0 - 15.0 (g/dL)    HCT 16.1  09.6 - 04.5 (%)    MCV 97.6  78.0 - 100.0 (fL)    MCH 31.4  26.0 - 34.0 (pg)    MCHC 32.1  30.0 - 36.0 (g/dL)    RDW 40.9  81.1 - 91.4 (%)    Platelets 199  150 - 400 (K/uL)   DIFFERENTIAL     Status: Abnormal   Collection Time   07/13/11  6:32 PM      Component Value Range Comment   Neutrophils Relative 79 (*) 43 - 77 (%)    Neutro Abs 7.9 (*) 1.7 - 7.7 (K/uL)    Lymphocytes Relative 13  12 - 46 (%)    Lymphs Abs 1.3  0.7 - 4.0 (K/uL)    Monocytes Relative 7  3 - 12 (%)    Monocytes Absolute 0.7  0.1 - 1.0 (K/uL)    Eosinophils Relative 1  0 - 5 (%)    Eosinophils Absolute 0.1  0.0 - 0.7 (K/uL)    Basophils Relative 0  0 - 1 (%)    Basophils Absolute 0.0  0.0 - 0.1 (K/uL)   BASIC METABOLIC PANEL     Status: Abnormal   Collection Time   07/13/11  6:32 PM      Component Value Range Comment   Sodium 141  135 - 145 (mEq/L)    Potassium 3.8  3.5 - 5.1  (mEq/L)    Chloride 103  96 - 112 (mEq/L)    CO2 24  19 - 32 (mEq/L)    Glucose, Bld 122 (*) 70 - 99 (mg/dL)    BUN 30 (*) 6 - 23 (mg/dL)    Creatinine, Ser 7.82  0.50 - 1.10 (mg/dL)    Calcium 9.9  8.4 - 10.5 (mg/dL)    GFR calc non Af Amer 60 (*) >90 (mL/min)    GFR calc Af Amer 69 (*) >90 (mL/min)   SURGICAL PCR SCREEN     Status: Normal   Collection Time   07/13/11 10:24 PM      Component Value Range Comment   MRSA, PCR NEGATIVE  NEGATIVE     Staphylococcus aureus NEGATIVE  NEGATIVE      Dg Chest 1 View  07/13/2011  *RADIOLOGY REPORT*  Clinical Data: Right hip fracture  CHEST - 1 VIEW  Comparison: 10/19/2010  Findings: Left-sided pacer in place.  Moderate enlargement of the cardiomediastinal silhouette without edema.  Skin folds overlie the chest bilaterally.  No pleural effusion.  No focal pulmonary opacity. Bones are osteopenic, which may mask subtle fracture.  IMPRESSION: Cardiomegaly without focal acute cardiopulmonary process.  Original Report Authenticated By: Harrel Lemon, M.D.   Dg Pelvis 1-2 Views  07/13/2011  *RADIOLOGY REPORT*  Clinical Data: Fall.  Right hip fracture.  PELVIS - 1-2 VIEW  Comparison: 09/14/2010  Findings: Mildly comminuted right intertrochanteric and subtrochanteric fracture noted, with an intermediate free fragment which includes the lesser trochanter.  Varus angulation noted.  No fracture of the bony pelvis is observed.  Lower lumbar degenerative disc disease noted.  Atherosclerosis is present.  IMPRESSION:  1.  Mildly comminuted right intertrochanteric and subtrochanteric fracture. Per CMS PQRS reporting requirements (PQRS Measure 24): Given the patient's age of greater  than 50 and the fracture site (hip, distal radius, or spine), the patient should be tested for osteoporosis using DXA, and the appropriate treatment considered based on the DXA results. 2.  Lower lumbar degenerative disc disease and spondylosis.  Original Report Authenticated By: Dellia Cloud, M.D.   Dg Femur Right  07/13/2011  *RADIOLOGY REPORT*  Clinical Data: Fall.  Right hip pain.  RIGHT FEMUR - 2 VIEW  Comparison: Pelvis radiographs from 07/13/2011  Findings: A mildly comminuted right intertrochanteric fracture has subtrochanteric extension, and is believed to have a separate lesser tuberosity fragment which includes the subtrochanteric extension.  Varus angulation noted.  Bony demineralization is present.  Atherosclerotic vascular calcification is present.  Osteoarthritis of the knee noted.  IMPRESSION:  1.  Comminuted intertrochanteric and subtrochanteric fracture of the right proximal femur, with varus angulation.  Original Report Authenticated By: Dellia Cloud, M.D.    Review of Systems  All other systems reviewed and are negative.   Blood pressure 93/53, pulse 69, temperature 99 F (37.2 C), temperature source Oral, resp. rate 18, height 5\' 1"  (1.549 m), weight 66.2 kg (145 lb 15.1 oz), SpO2 94.00%. Physical Exam  Constitutional: She appears well-developed and well-nourished.  HENT:  Head: Normocephalic.  Eyes: EOM are normal.  Neck: Normal range of motion.  Respiratory: Effort normal.  GI: Soft.  Musculoskeletal: She exhibits tenderness.       Right hip: She exhibits tenderness and bony tenderness.       She is unable to do a straight leg raise, and has pain located in the right groin. All of her other extremities are atraumatic.  Neurological: She is alert.  Skin: Skin is warm and dry.  Psychiatric: She has a normal mood and affect.    Assessment/Plan: Right intertrochanteric hip fracture with subtrochanteric extension.  This is an acute severe injury, which carries risks of both for morbidity and mortality. We have discussed operative versus nonoperative management, and I recommended surgical intervention in order to optimize function.  The risks benefits and alternatives were discussed with the patient including but not limited to the  risks of nonoperative treatment, versus surgical intervention including infection, bleeding, nerve injury, malunion, nonunion, hardware prominence, hardware failure, need for hardware removal, blood clots, cardiopulmonary complications, morbidity, mortality, among others, and they were willing to proceed.  Predicted outcome is good, although there will be at least a six to nine month expected recovery.  We're going to plan for surgery likely later on today.  Nik Gorrell P 07/14/2011, 7:56 AM

## 2011-07-14 NOTE — Progress Notes (Signed)
ANTICOAGULATION CONSULT NOTE - Initial Consult  Pharmacy Consult for Warfarin Indication: VTE prophylaxis  Allergies  Allergen Reactions  . Ace Inhibitors   . Atorvastatin     Patient Measurements: Height: 5\' 1"  (154.9 cm) Weight: 145 lb 15.1 oz (66.2 kg) IBW/kg (Calculated) : 47.8    Vital Signs: Temp: 97.7 F (36.5 C) (11/19 2157) Temp src: Oral (11/19 1415) BP: 93/41 mmHg (11/19 2157) Pulse Rate: 80  (11/19 2157)  Labs:  Basename 07/14/11 0708 07/13/11 1832  HGB 8.8* 11.7*  HCT 26.6* 36.4  PLT 144* 199  APTT -- --  LABPROT 15.4* --  INR 1.19 --  HEPARINUNFRC -- --  CREATININE 0.88 0.84  CKTOTAL -- --  CKMB -- --  TROPONINI -- --   Estimated Creatinine Clearance: 37.8 ml/min (by C-G formula based on Cr of 0.88).  Medical History: Past Medical History  Diagnosis Date  . Aortic valve regurgitation   . Congenital heart disease   . Left bundle branch block   . Hyperlipidemia   . Mitral valve disorder   . Pacemaker     For complete heart block, s/p generator change 09/2010   . Dyspnea   . Anxiety   . Depression   . Aneurysm of abdominal aorta     4.4 cm in 09/2010  . Closed intertrochanteric fracture of right hip 07/13/2011    Medications:  Prescriptions prior to admission  Medication Sig Dispense Refill  . acetaminophen (TYLENOL) 500 MG tablet Take 500 mg by mouth as needed.        Marland Kitchen aspirin 81 MG tablet Take 81 mg by mouth daily.        . beta carotene w/minerals (OCUVITE) tablet Take 1 tablet by mouth daily.        . carvedilol (COREG) 6.25 MG tablet Take 6.25 mg by mouth 2 (two) times daily.        . cholecalciferol (VITAMIN D) 1000 UNITS tablet Take 1,000 Units by mouth daily.        . furosemide (LASIX) 20 MG tablet Take 20 mg by mouth 2 (two) times daily.        . Glucosamine 500 MG CAPS Take by mouth. 3000 mg daily?      . losartan (COZAAR) 50 MG tablet Take 25 mg by mouth daily.       . Lutein 20 MG CAPS Take 20 mg by mouth daily.       .  meclizine (ANTIVERT) 25 MG tablet Take 25 mg by mouth 3 (three) times daily as needed. Nausea/dizziness       . Multiple Vitamins-Minerals (MULTIVITAL) tablet Take 1 tablet by mouth daily.       Marland Kitchen omeprazole (PRILOSEC) 20 MG capsule Take 20 mg by mouth daily.        Marland Kitchen oxybutynin (DITROPAN-XL) 10 MG 24 hr tablet Take 10 mg by mouth daily.        . simvastatin (ZOCOR) 20 MG tablet Take 20 mg by mouth at bedtime.          Assessment: 77 year old woman s/p femur fracture repair on 11/19 to start on Warfarin for VTE prophylaxis Goal of Therapy:  INR 2-3   Plan: Warfarin 5mg  x 1 dose today.  Daily Protimes.  Mickeal Skinner 07/14/2011,10:17 PM

## 2011-07-14 NOTE — Transfer of Care (Signed)
Immediate Anesthesia Transfer of Care Note  Patient: [name]  Procedure(s) Performed:  INTRAMEDULLARY (IM) NAIL FEMORAL - Synthes TFN, fracture table, big C arm, available after 4p  Patient Location: PACU  Anesthesia Type: General  Level of Consciousness: awake, alert , oriented and patient cooperative  Airway & Oxygen Therapy: Patient Spontanous Breathing and Patient connected to nasal cannula oxygen  Post-op Assessment: Report given to PACU RN, Post -op Vital signs reviewed and stable, Patient moving all extremities and Patient moving all extremities X 4  Post vital signs: Reviewed and stable  Complications: No apparent anesthesia complications

## 2011-07-14 NOTE — Anesthesia Postprocedure Evaluation (Signed)
Anesthesia Post Note  Patient: [name]  Procedure(s) Performed:  INTRAMEDULLARY (IM) NAIL FEMORAL - Synthes TFN, fracture table, big C arm, available after 4p  Anesthesia type: General  Patient location: PACU  Post pain: Pain level controlled  Post assessment: Patient's Cardiovascular Status Stable  Last Vitals:  Filed Vitals:   07/14/11 2030  BP:   Pulse: 73  Temp:   Resp: 25    Post vital signs: Reviewed and stable  Level of consciousness: sedated  Complications: No apparent anesthesia complications

## 2011-07-14 NOTE — Brief Op Note (Signed)
07/13/2011 - 07/14/2011  7:37 PM  PATIENT:  [name]  77 y.o. female  PRE-OPERATIVE DIAGNOSIS:  right hip fracture  POST-OPERATIVE DIAGNOSIS:  Same  PROCEDURE:  INTRAMEDULLARY (IM) NAIL FEMORAL  SURGEON:  Alveta Quintela P  ANESTHESIA:   General

## 2011-07-15 ENCOUNTER — Encounter (HOSPITAL_COMMUNITY): Payer: Self-pay | Admitting: Orthopedic Surgery

## 2011-07-15 DIAGNOSIS — D62 Acute posthemorrhagic anemia: Secondary | ICD-10-CM | POA: Diagnosis not present

## 2011-07-15 LAB — CBC
HCT: 23.8 % — ABNORMAL LOW (ref 36.0–46.0)
Hemoglobin: 7.7 g/dL — ABNORMAL LOW (ref 12.0–15.0)
RBC: 2.43 MIL/uL — ABNORMAL LOW (ref 3.87–5.11)

## 2011-07-15 LAB — PROTIME-INR
INR: 1.21 (ref 0.00–1.49)
Prothrombin Time: 15.6 seconds — ABNORMAL HIGH (ref 11.6–15.2)

## 2011-07-15 LAB — BASIC METABOLIC PANEL
Calcium: 8.4 mg/dL (ref 8.4–10.5)
Chloride: 108 mEq/L (ref 96–112)
Creatinine, Ser: 0.69 mg/dL (ref 0.50–1.10)
GFR calc Af Amer: 87 mL/min — ABNORMAL LOW (ref >90)
GFR calc non Af Amer: 75 mL/min — ABNORMAL LOW (ref >90)

## 2011-07-15 LAB — GLUCOSE, CAPILLARY

## 2011-07-15 MED ORDER — TRAMADOL HCL 50 MG PO TABS
50.0000 mg | ORAL_TABLET | Freq: Four times a day (QID) | ORAL | Status: DC | PRN
  Administered 2011-07-15 – 2011-07-17 (×2): 50 mg via ORAL
  Filled 2011-07-15: qty 1

## 2011-07-15 MED ORDER — FUROSEMIDE 10 MG/ML IJ SOLN
20.0000 mg | Freq: Once | INTRAMUSCULAR | Status: AC
  Administered 2011-07-15: 20 mg via INTRAVENOUS
  Filled 2011-07-15: qty 2

## 2011-07-15 MED ORDER — WARFARIN SODIUM 5 MG PO TABS
5.0000 mg | ORAL_TABLET | Freq: Once | ORAL | Status: DC
  Filled 2011-07-15: qty 1

## 2011-07-15 NOTE — Progress Notes (Signed)
Physical Therapy Evaluation Patient Details Name: [name] MRN: [id number] DOB: [birth year] Today's Date: 07/15/2011  Problem List:  Patient Active Problem List  Diagnoses  . SINOATRIAL NODE DYSFUNCTION  . CHRONIC SYSTOLIC HEART FAILURE  . CARDIAC PACEMAKER IN SITU  . CHF  . Aortic valve regurgitation  . Congenital heart disease  . Left bundle branch block  . Hyperlipidemia  . Mitral valve disorder  . Pacemaker  . Dyspnea  . Anxiety  . Depression  . Aneurysm of abdominal aorta  . Closed intertrochanteric fracture of right hip  . Hypertension  . Postoperative anemia due to acute blood loss    Past Medical History:  Past Medical History  Diagnosis Date  . Aortic valve regurgitation   . Congenital heart disease   . Left bundle branch block   . Hyperlipidemia   . Mitral valve disorder   . Pacemaker     For complete heart block, s/p generator change 09/2010   . Dyspnea   . Anxiety   . Depression   . Aneurysm of abdominal aorta     4.4 cm in 09/2010  . Closed intertrochanteric fracture of right hip 07/13/2011   Past Surgical History:  Past Surgical History  Procedure Date  . Knee arthroscopy   . Pacemaker insertion   . Insert / replace / remove pacemaker   . Femur im nail 07/14/2011    Procedure: INTRAMEDULLARY (IM) NAIL FEMORAL;  Surgeon: Eulas Post;  Location: [facility] OR;  Service: Orthopedics;  Laterality: Right;  Synthes TFN, fracture table, big C arm, available after 4p    PT Assessment/Plan/Recommendation PT Assessment Clinical Impression Statement: pt is very motivated to improve mobility to return home and Max I, however would benefit from ST-SNF at D/C to increase safety and mobility.   PT Recommendation/Assessment: Patient will need skilled PT in the acute care venue PT Problem List: Decreased strength;Decreased range of motion;Decreased activity tolerance;Decreased balance;Decreased mobility;Decreased knowledge of use of DME;Decreased knowledge of  precautions;Pain Barriers to Discharge: Decreased caregiver support PT Therapy Diagnosis : Difficulty walking;Acute pain PT Plan PT Frequency: Min 6X/week PT Treatment/Interventions: DME instruction;Gait training;Stair training;Functional mobility training;Therapeutic exercise;Balance training;Patient/family education PT Recommendation Recommendations for Other Services: OT consult Follow Up Recommendations: Skilled nursing facility (however pt wants Home with HHPT) Equipment Recommended: Defer to next venue PT Goals  Acute Rehab PT Goals PT Goal Formulation: With patient Time For Goal Achievement: 2 weeks Pt will go Supine/Side to Sit: with supervision;with rail PT Goal: Supine/Side to Sit - Progress: Progressing toward goal Pt will Transfer Sit to Stand/Stand to Sit: with supervision;with upper extremity assist PT Transfer Goal: Sit to Stand/Stand to Sit - Progress: Progressing toward goal Pt will Ambulate: 51 - 150 feet;with supervision;with rolling walker PT Goal: Ambulate - Progress: Progressing toward goal Pt will Perform Home Exercise Program: with supervision, verbal cues required/provided PT Goal: Perform Home Exercise Program - Progress: Progressing toward goal  PT Evaluation Precautions/Restrictions  Precautions Precautions: Fall;Posterior Hip Restrictions Weight Bearing Restrictions: Yes RLE Weight Bearing: Weight bearing as tolerated Prior Functioning  Home Living Lives With: Spouse (Has CG for spouse with dementia) Receives Help From: Family;Friend(s) Type of Home: House Home Layout: Able to live on main level with bedroom/bathroom Prior Function Level of Independence: Independent with basic ADLs;Independent with homemaking with ambulation;Independent with gait (pt notes carried cane, but rarely used.  ) Able to Take Stairs?: Reciprically Cognition Cognition Orientation Level: Oriented X4 Sensation/Coordination   Extremity Assessment RLE Assessment RLE  Assessment: Exceptions to  WFL RLE Strength RLE Overall Strength Comments: pt limited by pain in R thigh LLE Assessment LLE Assessment: Within Functional Limits Mobility (including Balance) Bed Mobility Bed Mobility: Yes Supine to Sit: 4: Min assist;With rails;HOB elevated (Comment degrees);Other (comment) (HOB ~25) Supine to Sit Details (indicate cue type and reason): cues for hip precautions and safe technique Sitting - Scoot to Edge of Bed: 5: Supervision Transfers Transfers: Yes Sit to Stand: 3: Mod assist;With upper extremity assist;From bed Sit to Stand Details (indicate cue type and reason): cues for safe technique, use of UEs and hip precautions Stand to Sit: 4: Min assist;With upper extremity assist;To chair/3-in-1 Stand to Sit Details: cues for Ue use and positioning of R LE Ambulation/Gait Ambulation/Gait: Yes Ambulation/Gait Assistance: 4: Min assist Ambulation/Gait Assistance Details (indicate cue type and reason): cues for sequencing, RW use, hip precautions, upright posture Ambulation Distance (Feet): 3 Feet Assistive device: Rolling walker Gait Pattern: Step-to pattern;Trunk flexed;Antalgic Stairs: No    Exercise  Total Joint Exercises Ankle Circles/Pumps: AROM;Both;15 reps;Seated Quad Sets: AROM;15 reps;Seated Long Arc Quad: AROM;Right;10 reps;Seated End of Session PT - End of Session Equipment Utilized During Treatment: Gait belt Activity Tolerance: Patient limited by pain;Patient limited by fatigue Patient left: in chair;with call bell in reach;with family/visitor present Nurse Communication: Mobility status for transfers General Behavior During Session: [facility] for tasks performed Cognition: [facility] for tasks performed  Sunny Schlein, [place] [phone] 07/15/2011, 2:51 PM

## 2011-07-15 NOTE — Progress Notes (Signed)
Patient ID: [name], female   DOB: 1919/09/04, 77 y.o.   MRN: [id number]  Procedure(s) (LRB): INTRAMEDULLARY (IM) NAIL FEMORAL (Right) 1 Day Post-Op   Subjective:  Patient reports pain as mild.  No complaints.  Objective:   VITALS:  BP 101/38  Pulse 60  Temp(Src) 97.6 F (36.4 C) (Oral)  Resp 18  Ht 5\' 1"  (1.549 m)  Wt 66.2 kg (145 lb 15.1 oz)  BMI 27.58 kg/m2  SpO2 93%   Neurologically intact Dorsiflexion/Plantar flexion intact Incision: no drainage   LABS Lab Results  Component Value Date   HGB 7.7* 07/15/2011   HGB 8.8* 07/14/2011   HGB 11.7* 07/13/2011   Lab Results  Component Value Date   WBC 6.8 07/15/2011   Lab Results  Component Value Date   INR 1.21 07/15/2011   Lab Results  Component Value Date   NA 139 07/15/2011   K 4.3 07/15/2011   CL 108 07/15/2011   CO2 23 07/15/2011   BUN 29* 07/15/2011   CREATININE 0.69 07/15/2011   GLUCOSE 158* 07/15/2011     Assessment/Plan: Principal Problem:  *Closed intertrochanteric fracture of right hip Active Problems:  Aortic valve regurgitation  Mitral valve disorder  Pacemaker  Hypertension  Anemia   Advance diet Up with therapy Discharge to SNF ABLA:  Receiving blood now.   Joselin Crandell P 07/15/2011, 9:37 AM

## 2011-07-15 NOTE — Progress Notes (Signed)
Patient ID: [name], female   DOB: February 08, 1920, 77 y.o.   MRN: [id number] Patient s/p IM nail for a right intertrochanteric hip fx.  She is looking remarkably well! We discussed anticoagulation and the importance of it given her hip fx.  Nurse Selena Batten informed me that lovenox would be initiated at 40mg  qd along with SCD's in place of coumadin which the patient is opposed to because of prior experiences.  She typically does well with pain and uses tylenol or tramadol for breakthrough pain.  Thanks for your great care!  I've known [name] and her husband for some time.  I'll be around for support as needed.  Probably will need to go to short term SNF given her husband's cognitive status.  It would probably do her a lot of good for Greggory Stallion to visit her tomorrow as today is their anniversary!  Thanks again,  Ivory Broad

## 2011-07-15 NOTE — Progress Notes (Signed)
ANTICOAGULATION CONSULT NOTE - Follow Up Consult  Pharmacy Consult for coumadin Indication: VTE prophylaxis, s/p IM nail R hip  Allergies  Allergen Reactions  . Ace Inhibitors   . Atorvastatin     Labs:  Basename 07/15/11 0500 07/14/11 0708 07/13/11 1832  HGB 7.7* 8.8* --  HCT 23.8* 26.6* 36.4  PLT 103* 144* 199  APTT -- -- --  LABPROT 15.6* 15.4* --  INR 1.21 1.19 --  HEPARINUNFRC -- -- --  CREATININE 0.69 0.88 0.84  CKTOTAL -- -- --  CKMB -- -- --  TROPONINI -- -- --   Estimated Creatinine Clearance: 41.5 ml/min (by C-G formula based on Cr of 0.69).   Assessment: INR still baseline after first dose. ABLA getting 2 units PRBCs. Goal of Therapy:  INR 2-3   Plan:  Coumadin 5mg  x1. LMWH 40qday until tx  Len Childs T 07/15/2011,11:06 AM

## 2011-07-15 NOTE — Progress Notes (Signed)
Patient ID: [name], female   DOB: 09/20/1919, 77 y.o.   MRN: [id number] Subjective: Pt  S/p right hip surg Anemia post op; HBG low Blood pressure low due to loss of blood  Objective: Vital signs in last 24 hours: Temp:  [97.7 F (36.5 C)-98.8 F (37.1 C)] 98 F (36.7 C) (11/20 0511) Pulse Rate:  [66-82] 82  (11/20 0511) Resp:  [8-26] 18  (11/20 0511) BP: (93-170)/(41-66) 113/66 mmHg (11/20 0511) SpO2:  [92 %-100 %] 93 % (11/20 0511)   Intake/Output from previous day: 11/19 0701 - 11/20 0700 In: 1500 [I.V.:1500] Out: 75 [Blood:75]    General appearance: alert Resp: clear to auscultation bilaterally Cardio: regular rate and rhythm, S1, S2 normal, no murmur, click, rub or gallop Extremities: extremities normal, atraumatic, no cyanosis or edema  Lab Results:  Basename 07/15/11 0500 07/14/11 0708  WBC 6.8 5.6  HGB 7.7* 8.8*  HCT 23.8* 26.6*  PLT 103* 144*   BMET  Basename 07/15/11 0500 07/14/11 0708  NA 139 141  K 4.3 3.6  CL 108 107  CO2 23 26  GLUCOSE 158* 132*  BUN 29* 32*  CREATININE 0.69 0.88  CALCIUM 8.4 8.4   Lab Results  Component Value Date   ALT 14 10/05/2010   AST 23 10/05/2010   ALKPHOS 50 10/05/2010   BILITOT 0.8 10/05/2010    Assessment/Plan:  Principal Problem:  *Closed intertrochanteric fracture of right hip S/p repair; pain conyrol with tramadol Per ortho PT SNF rehab Active Problems:  Aortic valve regurgitation stable  Mitral valve disorder  Pacemaker  Hypertension bp is low due to blood loss Hold BP med Hold lasix  Anemia Transfuse 2 unit today. socail work consult.   [name] 07/15/2011, 8:09 AM

## 2011-07-16 ENCOUNTER — Telehealth (HOSPITAL_COMMUNITY): Payer: Self-pay | Admitting: Occupational Therapy

## 2011-07-16 LAB — PREPARE RBC (CROSSMATCH)

## 2011-07-16 LAB — BASIC METABOLIC PANEL
GFR calc Af Amer: 67 mL/min — ABNORMAL LOW (ref >90)
GFR calc non Af Amer: 58 mL/min — ABNORMAL LOW (ref >90)
Potassium: 3.9 mEq/L (ref 3.5–5.1)
Sodium: 136 mEq/L (ref 135–145)

## 2011-07-16 LAB — CBC
MCHC: 34.2 g/dL (ref 30.0–36.0)
RDW: 16.7 % — ABNORMAL HIGH (ref 11.5–15.5)
WBC: 6.4 10*3/uL (ref 4.0–10.5)

## 2011-07-16 LAB — PROTIME-INR
INR: 1.56 — ABNORMAL HIGH (ref 0.00–1.49)
Prothrombin Time: 19 seconds — ABNORMAL HIGH (ref 11.6–15.2)

## 2011-07-16 MED ORDER — RIVAROXABAN 10 MG PO TABS
10.0000 mg | ORAL_TABLET | Freq: Every day | ORAL | Status: DC
  Administered 2011-07-16 – 2011-07-18 (×3): 10 mg via ORAL
  Filled 2011-07-16 (×3): qty 1

## 2011-07-16 MED ORDER — SENNOSIDES-DOCUSATE SODIUM 8.6-50 MG PO TABS: 1.0000 | ORAL_TABLET | Freq: Every day | ORAL | Status: DC

## 2011-07-16 MED ORDER — PANTOPRAZOLE SODIUM 40 MG PO TBEC: 40.0000 mg | DELAYED_RELEASE_TABLET | Freq: Every day | ORAL | Status: DC

## 2011-07-16 MED ORDER — FUROSEMIDE 10 MG/ML IJ SOLN
20.0000 mg | Freq: Once | INTRAMUSCULAR | Status: AC
  Administered 2011-07-16: 20 mg via INTRAVENOUS
  Filled 2011-07-16: qty 2

## 2011-07-16 NOTE — Progress Notes (Signed)
Procedure(s) (LRB): INTRAMEDULLARY (IM) NAIL FEMORAL (Right) 2 Days Post-Op   Subjective:  Patient reports pain as mild.  Refused coumadin, which was why I ordered lovenox.  Objective:   VITALS:  BP 112/66  Pulse 73  Temp(Src) 97.9 F (36.6 C) (Oral)  Resp 19  Ht 5\' 1"  (1.549 m)  Wt 66.2 kg (145 lb 15.1 oz)  BMI 27.58 kg/m2  SpO2 96%  Neurologically intact Sensation intact distally Dorsiflexion/Plantar flexion intact Incision: dressing C/D/I   LABS Lab Results  Component Value Date   HGB 8.2* 07/16/2011   HGB 7.7* 07/15/2011   HGB 8.8* 07/14/2011   Lab Results  Component Value Date   WBC 6.4 07/16/2011   Lab Results  Component Value Date   INR 1.56* 07/16/2011   Lab Results  Component Value Date   NA 136 07/16/2011   K 3.9 07/16/2011   CL 105 07/16/2011   CO2 24 07/16/2011   BUN 33* 07/16/2011   CREATININE 0.86 07/16/2011   GLUCOSE 110* 07/16/2011   CBC    Component Value Date/Time   WBC 6.4 07/16/2011 0500   RBC 2.63* 07/16/2011 0500   HGB 8.2* 07/16/2011 0500   HCT 24.0* 07/16/2011 0500   PLT 94* 07/16/2011 0500   MCV 91.3 07/16/2011 0500   MCH 31.2 07/16/2011 0500   MCHC 34.2 07/16/2011 0500   RDW 16.7* 07/16/2011 0500   LYMPHSABS 1.3 07/13/2011 1832   MONOABS 0.7 07/13/2011 1832   EOSABS 0.1 07/13/2011 1832   BASOSABS 0.0 07/13/2011 1832       Assessment/Plan: Principal Problem:  *Closed intertrochanteric fracture of right hip Active Problems:  Aortic valve regurgitation  Mitral valve disorder  Pacemaker  Hypertension  Postoperative anemia due to acute blood loss Thrombocytopenia  Advance diet Up with therapy Discharge to SNF  Will defer anticoagulation to Dr. Eula Listen, but she doesn't want coumadin, may consider one of the newer oral agents if it does not affect platelets.  Also, one more unit of PRBCs appropriate for ABLA.   Mavric Cortright P 07/16/2011, 9:01 AM

## 2011-07-16 NOTE — Progress Notes (Signed)
Physical Therapy Treatment Patient Details Name: [name] MRN: [id number] DOB: February 20, 1920 Today's Date: 07/16/2011  PT Assessment/Plan  PT - Assessment/Plan Comments on Treatment Session: Pt progressing well, pt at a supervision-minimal assistance level for all mobility. Pt requires increased cueing to maintain hip precautions throughout session.  PT Plan: Discharge plan remains appropriate PT Frequency: Min 6X/week Follow Up Recommendations: Skilled nursing facility Equipment Recommended: Defer to next venue PT Goals  Acute Rehab PT Goals PT Goal Formulation: With patient Time For Goal Achievement: 2 weeks PT Goal: Supine/Side to Sit - Progress: Progressing toward goal PT Transfer Goal: Sit to Stand/Stand to Sit - Progress: Progressing toward goal PT Goal: Ambulate - Progress: Progressing toward goal PT Goal: Perform Home Exercise Program - Progress: Progressing toward goal  PT Treatment Precautions/Restrictions  Precautions Precautions: Fall;Posterior Hip Precaution Comments: Pt educated twice on THP Required Braces or Orthoses: No Restrictions Weight Bearing Restrictions: Yes RLE Weight Bearing: Weight bearing as tolerated Mobility (including Balance) Bed Mobility Bed Mobility: No (pt in recliner upon arrival) Transfers Transfers: Yes Sit to Stand: 5: Supervision;With upper extremity assist;From chair/3-in-1 Sit to Stand Details (indicate cue type and reason): VC for hand placement and sequencing with total hip precautions Stand to Sit: 4: Min assist;To chair/3-in-1;With upper extremity assist Stand to Sit Details: VC for hand placement and technique into sitting Ambulation/Gait Ambulation/Gait: Yes Ambulation/Gait Assistance: 5: Supervision;4: Min assist (Supervision-Minguard assist) Ambulation/Gait Assistance Details (indicate cue type and reason): VC for proper gait sequencing and VC to maintain precautions throughout turns Ambulation Distance (Feet): 30  Feet Assistive device: Rolling walker Gait Pattern: Step-to pattern;Trunk flexed;Antalgic;Decreased hip/knee flexion - right Gait velocity: Decreased gait speed Stairs: No    Exercise  Total Joint Exercises Ankle Circles/Pumps: AROM;Both;10 reps;Supine Quad Sets: AROM;Right;10 reps;Strengthening;Supine Hip ABduction/ADduction: AROM;Strengthening;Right;10 reps;Supine Straight Leg Raises: PROM;Right;10 reps;Supine (pt unable to assist with SLR) End of Session PT - End of Session Equipment Utilized During Treatment: Gait belt Activity Tolerance: Patient limited by pain Patient left: in chair;with call bell in reach Nurse Communication: Mobility status for transfers;Mobility status for ambulation General Behavior During Session: [facility] for tasks performed Cognition: [facility] for tasks performed  Milana Kidney 07/16/2011, 1:55 PM  07/16/2011 Milana Kidney DPT PAGER: [phone] OFFICE: [phone]

## 2011-07-16 NOTE — Progress Notes (Signed)
Patient ID: Kallen Delatorre, female   DOB: Dec 16, 1919, 77 y.o.   MRN: [id number] Subjective: C/o poor sleep Some pain in hip on moving S/p blood  x2 unit plt count low  Objective: Vital signs in last 24 hours: Temp:  [97.6 F (36.4 C)-99.2 F (37.3 C)] 97.9 F (36.6 C) (11/21 0622) Pulse Rate:  [60-109] 73  (11/21 0622) Resp:  [15-20] 20  (11/21 0622) BP: (101-143)/(33-69) 119/63 mmHg (11/21 0622) SpO2:  [93 %-97 %] 97 % (11/21 0622) FiO2 (%):  [3 %] 3 % (11/21 0622)   Intake/Output from previous day: 11/20 0701 - 11/21 0700 In: 1350 [P.O.:600; I.V.:50; Blood:700] Out: 950 [Urine:950]    General appearance: alert Resp: clear to auscultation bilaterally Cardio: regular rate and rhythm, S1, S2 normal, no murmur, click, rub or gallop Extremities: extremities normal, atraumatic, no cyanosis or edema  Lab Results:  Basename 07/16/11 0500 07/15/11 0500  WBC 6.4 6.8  HGB 8.2* 7.7*  HCT 24.0* 23.8*  PLT 94* 103*   BMET  Basename 07/16/11 0500 07/15/11 0500  NA 136 139  K 3.9 4.3  CL 105 108  CO2 24 23  GLUCOSE 110* 158*  BUN 33* 29*  CREATININE 0.86 0.69  CALCIUM 8.0* 8.4   Lab Results  Component Value Date   ALT 14 10/05/2010   AST 23 10/05/2010   ALKPHOS 50 10/05/2010   BILITOT 0.8 10/05/2010    Assessment/Plan:  Principal Problem:  *Closed intertrochanteric fracture of right hip As per ortho Pain control with tramadol Active Problems:  Aortic valve regurgitation  Mitral valve disorder  Pacemaker  Hypertension bp on low normal  Postoperative anemia due to acute blood loss hgb 8.2 after 2 unit Given h/o CAD/ CHF 2 unit blood today Low platlets:  Due to stress or lovanox If ok with ortho d/c lovanox; continue coumadin Add PPI; d/c ibuprofen SNF   Merit Maybee 07/16/2011, 7:53 AM

## 2011-07-16 NOTE — Progress Notes (Signed)
Occupational Therapy Evaluation Patient Details Name: [name] MRN: [id number] DOB: 05-18-1920 Today's Date: 07/16/2011  Problem List:  Patient Active Problem List  Diagnoses  . SINOATRIAL NODE DYSFUNCTION  . CHRONIC SYSTOLIC HEART FAILURE  . CARDIAC PACEMAKER IN SITU  . CHF  . Aortic valve regurgitation  . Congenital heart disease  . Left bundle branch block  . Hyperlipidemia  . Mitral valve disorder  . Pacemaker  . Dyspnea  . Anxiety  . Depression  . Aneurysm of abdominal aorta  . Closed intertrochanteric fracture of right hip  . Hypertension  . Postoperative anemia due to acute blood loss    Past Medical History:  Past Medical History  Diagnosis Date  . Aortic valve regurgitation   . Congenital heart disease   . Left bundle branch block   . Hyperlipidemia   . Mitral valve disorder   . Pacemaker     For complete heart block, s/p generator change 09/2010   . Dyspnea   . Anxiety   . Depression   . Aneurysm of abdominal aorta     4.4 cm in 09/2010  . Closed intertrochanteric fracture of right hip 07/13/2011   Past Surgical History:  Past Surgical History  Procedure Date  . Knee arthroscopy   . Pacemaker insertion   . Insert / replace / remove pacemaker   . Femur im nail 07/14/2011    Procedure: INTRAMEDULLARY (IM) NAIL FEMORAL;  Surgeon: Eulas Post;  Location: [facility] OR;  Service: Orthopedics;  Laterality: Right;  Synthes TFN, fracture table, big C arm, available after 4p    OT Assessment/Plan/Recommendation OT Assessment Clinical Impression Statement: decreased activity tolerance, safety, safety with DME, adl retraining, recall of THP OT Recommendation/Assessment: Patient will need skilled OT in the acute care venue OT Problem List: Decreased strength;Decreased activity tolerance;Impaired balance (sitting and/or standing);Decreased safety awareness;Decreased knowledge of use of DME or AE;Decreased knowledge of precautions Barriers to Discharge: Other  (comment) (assistance at d/c home) OT Therapy Diagnosis : Generalized weakness;Acute pain OT Plan OT Frequency: Min 2X/week OT Treatment/Interventions: Self-care/ADL training;Energy conservation;DME and/or AE instruction;Therapeutic activities;Balance training;Patient/family education OT Recommendation Follow Up Recommendations: Skilled nursing facility Equipment Recommended: Defer to next venue Individuals Consulted Consulted and Agree with Results and Recommendations: Patient OT Goals Acute Rehab OT Goals OT Goal Formulation: With patient Time For Goal Achievement: 2 weeks ADL Goals Pt Will Perform Grooming: with modified independence;Standing at sink;Unsupported Pt Will Perform Upper Body Bathing: with modified independence;Sitting, chair;Unsupported Pt Will Perform Lower Body Bathing: with min assist;Sit to stand from chair;Unsupported (AE PRN) Pt Will Perform Upper Body Dressing: with modified independence;Sitting, chair;Unsupported Pt Will Perform Lower Body Dressing: with set-up;Sit to stand from chair;with adaptive equipment;Unsupported Pt Will Transfer to Toilet: with modified independence;with DME;3-in-1 Pt Will Perform Toileting - Hygiene: with modified independence;Sit to stand from 3-in-1/toilet;with adaptive equipment Miscellaneous OT Goals Miscellaneous OT Goal #1: pt will verbalize 3 out 2 RLE THP as precursor to ADls   OT Evaluation Precautions/Restrictions  Precautions Precautions: Fall;Posterior Hip Precaution Comments: PT unable to recall THP and pt reeducated (pt questioned at the end of session on THP and recalled 1 ) Restrictions Weight Bearing Restrictions: Yes RLE Weight Bearing: Weight bearing as tolerated Prior Functioning Home Living Lives With: Spouse Receives Help From: Family;Friend(s) Type of Home: House Home Layout: Able to live on main level with bedroom/bathroom Home Access: Ramped entrance Bathroom Shower/Tub: Walk-in shower;Door Kelly Services: Standard (toilet riser ) Bathroom Accessibility: Yes Home Adaptive Equipment: Grab bars around  toilet;Grab bars in shower;Hand-held shower hose;Shower chair with back Additional Comments: pt has handicapp accesible home due to husband with h/o CVA Prior Function Level of Independence: Independent with basic ADLs;Independent with homemaking with ambulation;Independent with homemaking with wheelchair;Independent with gait;Independent with transfers Able to Take Stairs?: Reciprically Driving: Yes Vocation: Retired ADL ADL Grooming: Not assessed (states "I have completed that today") Upper Body Dressing: Performed;Set up Where Assessed - Upper Body Dressing: Sitting, chair;Unsupported Toilet Transfer: Simulated;Supervision/safety Toilet Transfer Method: Stand pivot Acupuncturist: Raised toilet seat with arms (or 3-in-1 over toilet) Equipment Used: Rolling walker ADL Comments: pt could benefit from AE education to increase I with LB Vision/Perception  Vision - History Baseline Vision: No visual deficits Patient Visual Report: No change from baseline Cognition Cognition Orientation Level: Oriented X4 Cognition - Other Comments: pt poor recall of precautions. Pt states "is this a cognition test?" when asked precautions (pt recognized that education was performed unable to recall) Sensation/Coordination Coordination Gross Motor Movements are Fluid and Coordinated: Yes Fine Motor Movements are Fluid and Coordinated: Yes Extremity Assessment RUE Assessment RUE Assessment: Within Functional Limits LUE Assessment LUE Assessment: Within Functional Limits Mobility  Bed Mobility Bed Mobility: No Transfers Transfers: Yes Sit to Stand: 5: Supervision;With upper extremity assist;From chair/3-in-1 Stand to Sit: 4: Min assist;Without upper extremity assist;To chair/3-in-1 (Mod v/c for RLE and safety. pt attempting to sit prematurely) Exercises   End of Session OT - End  of Session Equipment Utilized During Treatment: Gait belt;Other (comment) (corset brace for hernia) Activity Tolerance: Patient tolerated treatment well Patient left: in chair;with call bell in reach Nurse Communication: Mobility status for transfers General Behavior During Session: [employer] for tasks performed Cognition: [facility] for tasks performed  PT could benefit from AE education next session.    Harrel Carina [facility] 07/16/2011, 1:50 PM  Pager: [phone]

## 2011-07-16 NOTE — Progress Notes (Signed)
LATE ENTRY: on 07/15/11 CSW completed the psychosocial assessment with patient and caregiver Doree Barthel. CSW will initiate a skilled facility search once FL-2 completed. 07/16/11 - FL-2 completed and sent out to facilities in [facility] per patient request. Patient's preference is [facility]. CSW rec'd a call from Walls (admissions staff) at [facility] offering a bed as Ms. Little had made contact with them regarding patient coming to them for rehab. They can take patient on Friday. CSW visited room to update patient on bed offers, but patient was asleep. Call made to Ms. Little and she was advised of bed availability at [facility]. FL-2 placed in chart for MD signature.  Genelle Bal, MSW, LCSW [phone]

## 2011-07-16 NOTE — Plan of Care (Signed)
Problem: Phase III Progression Outcomes Goal: Activity at appropriate level-compared to baseline (UP IN CHAIR FOR HEMODIALYSIS)  Outcome: Progressing Pt tolerated sit<>stand from chair Supervision level and ambulated to door and back to chair.

## 2011-07-16 NOTE — Progress Notes (Signed)
PT Cancellation Note  Treatment cancelled today due to patient receiving 1st unit of blood. Will attempt session later in day   07/16/2011 Milana Kidney DPT PAGER: [phone] OFFICE: [phone]

## 2011-07-16 NOTE — Progress Notes (Signed)
ANTICOAGULATION CONSULT NOTE - Follow Up Consult  Pharmacy Consult for xarelto Indication: VTE prophylaxis, pt refused coumadin. pltc dropping on LMWH  Allergies  Allergen Reactions  . Ace Inhibitors   . Atorvastatin     Patient Measurements: Height: 5\' 1"  (154.9 cm) Weight: 145 lb 15.1 oz (66.2 kg) IBW/kg (Calculated) : 47.8    Basename 07/16/11 0500 07/15/11 0500 07/14/11 0708  HGB 8.2* 7.7* --  HCT 24.0* 23.8* 26.6*  PLT 94* 103* 144*  APTT -- -- --  LABPROT 19.0* 15.6* 15.4*  INR 1.56* 1.21 1.19  HEPARINUNFRC -- -- --  CREATININE 0.86 0.69 0.88  CKTOTAL -- -- --  CKMB -- -- --  TROPONINI -- -- --   Estimated Creatinine Clearance: 38.6 ml/min (by C-G formula based on Cr of 0.86).  Assessment: 77 yo WF s/p IM nail R hip, was initially started on LMWH 40 qday and coumadin for VTE px.  Pt refused coumadin and MD concerned for decreasing platelet count on LMWH. Creat cl ~ 39 ml/min; wt 66kg. No overt bleeding noted. To get PRBC's for ABLA    Plan:  xarelto 10mg  daily;   Len Childs T 07/16/2011,9:59 AM

## 2011-07-17 LAB — TYPE AND SCREEN
ABO/RH(D): O POS
Antibody Screen: NEGATIVE
Unit division: 0
Unit division: 0
Unit division: 0

## 2011-07-17 LAB — BASIC METABOLIC PANEL
CO2: 25 mEq/L (ref 19–32)
Calcium: 8.1 mg/dL — ABNORMAL LOW (ref 8.4–10.5)
Chloride: 107 mEq/L (ref 96–112)
Potassium: 3.8 mEq/L (ref 3.5–5.1)
Sodium: 138 mEq/L (ref 135–145)

## 2011-07-17 LAB — GLUCOSE, CAPILLARY: Glucose-Capillary: 110 mg/dL — ABNORMAL HIGH (ref 70–99)

## 2011-07-17 LAB — CBC
Platelets: 102 10*3/uL — ABNORMAL LOW (ref 150–400)
RBC: 3.33 MIL/uL — ABNORMAL LOW (ref 3.87–5.11)
WBC: 7 10*3/uL (ref 4.0–10.5)

## 2011-07-17 MED ORDER — OCUVITE-LUTEIN PO CAPS
1.0000 | ORAL_CAPSULE | Freq: Every day | ORAL | Status: DC
  Administered 2011-07-17: 1 via ORAL
  Filled 2011-07-17 (×2): qty 1

## 2011-07-17 MED ORDER — SENNA 8.6 MG PO TABS: 2.0000 | ORAL_TABLET | Freq: Every day | ORAL | Status: DC

## 2011-07-17 MED ORDER — DSS 100 MG PO CAPS: 100.0000 mg | ORAL_CAPSULE | Freq: Two times a day (BID) | ORAL | Status: AC

## 2011-07-17 MED ORDER — SENNA 8.6 MG PO TABS
2.0000 | ORAL_TABLET | Freq: Every day | ORAL | Status: DC
  Filled 2011-07-17 (×2): qty 2

## 2011-07-17 MED ORDER — TRAMADOL HCL 50 MG PO TABS: 50.0000 mg | ORAL_TABLET | Freq: Four times a day (QID) | ORAL | Status: AC | PRN

## 2011-07-17 MED ORDER — BISACODYL 5 MG PO TBEC: 10.0000 mg | DELAYED_RELEASE_TABLET | Freq: Every day | ORAL | Status: AC | PRN

## 2011-07-17 MED ORDER — BISACODYL 10 MG RE SUPP: 10.0000 mg | Freq: Every day | RECTAL | Status: AC | PRN

## 2011-07-17 MED ORDER — RIVAROXABAN 10 MG PO TABS: 10.0000 mg | ORAL_TABLET | Freq: Every day | ORAL | Status: DC

## 2011-07-17 MED ORDER — MENTHOL 3 MG MT LOZG: 1.0000 | LOZENGE | OROMUCOSAL | Status: DC | PRN

## 2011-07-17 MED ORDER — FUROSEMIDE 20 MG PO TABS
20.0000 mg | ORAL_TABLET | Freq: Every day | ORAL | Status: DC
  Administered 2011-07-17 – 2011-07-18 (×2): 20 mg via ORAL
  Filled 2011-07-17 (×2): qty 1

## 2011-07-17 NOTE — Progress Notes (Signed)
Physical Therapy Note: PT politely deferring therapy at this point stating she needs a nap before mobility. Will attempt to return later this morning, pt agreeable. Thanks. Toney Sang, PT [phone]

## 2011-07-17 NOTE — Progress Notes (Signed)
Patient ID: [name], female   DOB: 10/12/1919, 77 y.o.   MRN: [id number] Subjective: Pt state did not sleep well. No pain No SOB, no abd pain   Objective: Vital signs in last 24 hours: Temp:  [97.7 F (36.5 C)-99.2 F (37.3 C)] 99.2 F (37.3 C) (11/22 0600) Pulse Rate:  [70-81] 81  (11/22 0600) Resp:  [16-19] 16  (11/22 0600) BP: (107-134)/(36-82) 127/64 mmHg (11/22 0600) SpO2:  [92 %-99 %] 94 % (11/22 0600) FiO2 (%):  [2 %] 2 % (11/21 0933)   Intake/Output from previous day: 11/21 0701 - 11/22 0700 In: 1165 [P.O.:240; I.V.:875; Blood:50] Out: -     General appearance: alert and cooperative Resp: clear to auscultation bilaterally Cardio: systolic murmur: early systolic 1/6, crescendo at apex GI: soft, non-tender; bowel sounds normal; no masses,  no organomegaly Extremities: extremities normal, atraumatic, no cyanosis or edema  Lab Results:  Basename 07/17/11 0530 07/16/11 0500  WBC 7.0 6.4  HGB 10.3* 8.2*  HCT 29.9* 24.0*  PLT 102* 94*   BMET  Basename 07/17/11 0530 07/16/11 0500  NA 138 136  K 3.8 3.9  CL 107 105  CO2 25 24  GLUCOSE 117* 110*  BUN 20 33*  CREATININE 0.62 0.86  CALCIUM 8.1* 8.0*   Lab Results  Component Value Date   ALT 14 10/05/2010   AST 23 10/05/2010   ALKPHOS 50 10/05/2010   BILITOT 0.8 10/05/2010    Assessment/Plan:  Principal Problem:  *Closed intertrochanteric fracture of right hip CONTINUE WITH PT; PAIN CONTROL ADEQUATE, TRAMADOL/TYLENOL Active Problems:  Aortic valve regurgitation/CHF STABLE; RESTART LASIX 20 MG DAILY  Mitral valve disorder  Pacemaker STABLE SINUS  Hypertension IMPROVED AFTER BLOOD PRODUCT  Postoperative anemia due to acute blood loss IMPROVED AFTER BLOOD PRODUCT CONSTIPATION: [facility] SNF-REHAB TOMORROW OR [facility] IF BED AVAILABLE FL2 SIGNED . Emrey Thornley 07/17/2011, 9:04 AM

## 2011-07-17 NOTE — Discharge Summary (Signed)
Physician Discharge Summary  Patient ID: [name] MRN: [id number] DOB/AGE: 10-05-1919 77 y.o.  Admit date: 07/13/2011 Discharge date: 07/17/2011  Admission Diagnoses:  Discharge Diagnoses:  Principal Problem:  *Closed intertrochanteric fracture of right hip Active Problems:  Aortic valve regurgitation  Mitral valve disorder  Pacemaker  Hypertension  Postoperative anemia due to acute blood loss   Disposition: Home or Self Care   Consults: orthopedic surgery  Significant Diagnostic Studies: radiology: X-Ray: HIP;CHEST  Treatments: Right hip repair after fracture  Hospital Course: 77 years old female status post fall; right hip fracture  Problem #1 right hip fracture: Orthopedic was consulted, patient had repair of the right hip without any complication. Pain control adequate with tramadol and Tylenol. Physical therapy consult  Continue with physical therapy Problem 2: Anemia due to blood loss off to surgery required 4 units of packed red blood cells Hemoglobin improved from 7.7  TO 10.2  #3 thrombocytopenia: Lovenox was discontinued. Platelet count improved  XOLARTO 10 mg daily for 4 weeks for DVT prophylaxis CBC check next week Problem #4: Hypertension; blood pressure remained stable continue on her previous medications Problem #5: History of CHF. Clinically euvolemic, continue Lasix; Lasix was held in the hospital, restarted back on 07/17/11. #6: Constipation:stool softener, prune juice, Senokot GERD: Continue on omeprazole Urinary incontinence: Continue oxybutynin  Discharged Condition:  Stable  Discharge Orders    Future Appointments: Provider: Department: Dept Phone: Center:   01/30/2012 10:00 AM [facility] [phone] [facility]     Future Orders Please Complete By Expires   Diet general      Diet - low sodium heart healthy      Call MD / Call 911      Comments:   If you experience chest pain or shortness of breath, CALL 911 and be  transported to the hospital emergency room.  If you develope a fever above 101 F, pus (white drainage) or increased drainage or redness at the wound, or calf pain, call your surgeon's office.   Constipation Prevention      Comments:   Drink plenty of fluids.  Prune juice may be helpful.  You may use a stool softener, such as Colace (over the counter) 100 mg twice a day.  Use MiraLax (over the counter) for constipation as needed.   Increase activity slowly as tolerated      Weight Bearing as taught in Physical Therapy      Comments:   Use a walker or crutches as instructed.   Do not sit on low chairs, stoools or toilet seats, as it may be difficult to get up from low surfaces      Discharge wound care:      Comments:   If you have a hip bandage, keep it clean and dry.  Change your bandage as instructed by your health care providers.  If your bandage has been discontinued, keep your incision clean and dry.  Pat dry after bathing.  DO NOT put lotion or powder on your incision.   Discharge instructions      Comments:   Keep wounds dry and clean.  Change bandage every 2-3 days as needed.   Increase activity slowly      Discharge instructions      Comments:   RIGHT HIP SURGICAL WOUND CARE PER ORTHO F/U WITH ORTHOPEDIC IN 2 WEEKS.     Current Discharge Medication List    START taking these medications   Details  bisacodyl (DULCOLAX) 10 MG suppository  Place 1 suppository (10 mg total) rectally daily as needed for constipation (constipation). Qty: 12 suppository, Refills: 0    bisacodyl (DULCOLAX) 5 MG EC tablet Take 2 tablets (10 mg total) by mouth daily as needed for constipation. Qty: 30 tablet, Refills: 0    calcium-vitamin D (OSCAL WITH D) 500-200 MG-UNIT per tablet Take 1 tablet by mouth daily. Qty: 100 tablet, Refills: 2    docusate sodium 100 MG CAPS Take 100 mg by mouth 2 (two) times daily. Qty: 30 capsule, Refills: 0    menthol-cetylpyridinium (CEPACOL) 3 MG lozenge Take 1  lozenge (3 mg total) by mouth as needed (sore throat). Qty: 100 tablet, Refills: 0    rivaroxaban (XARELTO) 10 MG TABS tablet Take 1 tablet (10 mg total) by mouth daily. Qty: 30 tablet, Refills: 0    senna (SENOKOT) 8.6 MG TABS Take 2 tablets (17.2 mg total) by mouth at bedtime. Qty: 120 each, Refills: 0    traMADol (ULTRAM) 50 MG tablet Take 1 tablet (50 mg total) by mouth every 6 (six) hours as needed. Maximum dose= 8 tablets per day Qty: 30 tablet, Refills: 0      CONTINUE these medications which have NOT CHANGED   Details  acetaminophen (TYLENOL) 500 MG tablet Take 500 mg by mouth as needed.      aspirin 81 MG tablet Take 81 mg by mouth daily.      !! beta carotene w/minerals (OCUVITE) tablet Take 1 tablet by mouth daily.      carvedilol (COREG) 6.25 MG tablet Take 6.25 mg by mouth 2 (two) times daily.      cholecalciferol (VITAMIN D) 1000 UNITS tablet Take 1,000 Units by mouth daily.      furosemide (LASIX) 20 MG tablet Take 20 mg by mouth 2 (two) times daily.      Glucosamine 500 MG CAPS Take by mouth. 3000 mg daily?    losartan (COZAAR) 50 MG tablet Take 25 mg by mouth daily.     Lutein 20 MG CAPS Take 20 mg by mouth daily.     meclizine (ANTIVERT) 25 MG tablet Take 25 mg by mouth 3 (three) times daily as needed. Nausea/dizziness     !! Multiple Vitamins-Minerals (MULTIVITAL) tablet Take 1 tablet by mouth daily.     omeprazole (PRILOSEC) 20 MG capsule Take 20 mg by mouth daily.      oxybutynin (DITROPAN-XL) 10 MG 24 hr tablet Take 10 mg by mouth daily.      simvastatin (ZOCOR) 20 MG tablet Take 20 mg by mouth at bedtime.       !! - Potential duplicate medications found. Please discuss with provider.     Follow-up Information    Follow up with LANDAU,JOSHUA P in 2 weeks.   Contact information:   [facility] [address] [place] [address] [phone]          Signed: Georgann Housekeeper 07/17/2011, 9:30  AM

## 2011-07-17 NOTE — Progress Notes (Signed)
Physical Therapy Treatment Patient Details Name: [name] MRN: [id number] DOB: 12-26-1919 Today's Date: 07/17/2011  PT Assessment/Plan  PT - Assessment/Plan Comments on Treatment Session: Pt continues to progress and encouraged further mobility and exercise throughout day with nursing assistance. Son present throughout. PT Plan: Discharge plan remains appropriate PT Goals  Acute Rehab PT Goals PT Goal: Supine/Side to Sit - Progress: Progressing toward goal Pt will Transfer Sit to Stand/Stand to Sit: with modified independence PT Transfer Goal: Sit to Stand/Stand to Sit - Progress: Progressing toward goal PT Goal: Ambulate - Progress: Progressing toward goal PT Goal: Perform Home Exercise Program - Progress: Progressing toward goal  PT Treatment Precautions/Restrictions  Precautions Precautions: Fall Precaution Comments: Pt does not require total hip precautions secondary to IM nail and confirmed with Montez Morita Required Braces or Orthoses: No Restrictions Weight Bearing Restrictions: Yes RLE Weight Bearing: Weight bearing as tolerated Mobility (including Balance) Bed Mobility Supine to Sit: 4: Min assist;HOB flat;With rails Supine to Sit Details (indicate cue type and reason): cueing to sequence and assist of RLE to EOB Sitting - Scoot to Edge of Bed: 5: Supervision Sitting - Scoot to Edge of Bed Details (indicate cue type and reason): cueing for hand placement and to sequence Transfers Sit to Stand: 5: Supervision Sit to Stand Details (indicate cue type and reason): cueing for hand placement Stand to Sit: 5: Supervision Stand to Sit Details: cueing to sequence Ambulation/Gait Ambulation/Gait Assistance: 5: Supervision Ambulation/Gait Assistance Details (indicate cue type and reason): cueing to sequence and for posture Ambulation Distance (Feet): 54 Feet Assistive device: Rolling walker Gait Pattern: Step-to pattern;Decreased step length - right Stairs: No      Exercise  General Exercises - Lower Extremity Long Arc Quad: AAROM;Right;10 reps;Seated;Strengthening Hip Flexion/Marching: AAROM;Right;10 reps;Seated;Strengthening End of Session PT - End of Session Equipment Utilized During Treatment: Gait belt Activity Tolerance: Patient limited by pain Patient left: in chair;with call bell in reach;with family/visitor present Nurse Communication: Mobility status for ambulation;Mobility status for transfers General Behavior During Session: [facility] for tasks performed Cognition: [facility] for tasks performed  Delorse Lek 07/17/2011, 12:03 PM  Toney Sang, PT [phone]

## 2011-07-17 NOTE — Progress Notes (Signed)
Subjective: 3 Days Post-Op Procedure(s) (LRB): INTRAMEDULLARY (IM) NAIL FEMORAL (Right)  Did not sleep well last night Sore in R hip No cp, sob  Objective: Current Vitals Blood pressure 127/64, pulse 81, temperature 99.2 F (37.3 C), temperature source Oral, resp. rate 16, height 5\' 1"  (1.549 m), weight 66.2 kg (145 lb 15.1 oz), SpO2 94.00%. Vital signs in last 24 hours: Temp:  [97.7 F (36.5 C)-99.2 F (37.3 C)] 99.2 F (37.3 C) (11/22 0600) Pulse Rate:  [70-81] 81  (11/22 0600) Resp:  [16-19] 16  (11/22 0600) BP: (107-134)/(36-82) 127/64 mmHg (11/22 0600) SpO2:  [92 %-99 %] 94 % (11/22 0600) FiO2 (%):  [2 %] 2 % (11/21 0933)  Intake/Output from previous day: 11/21 0701 - 11/22 0700 In: 1165 [P.O.:240; I.V.:875; Blood:50] Out: -   LABS  Basename 07/17/11 0530 07/16/11 0500 07/15/11 0500  HGB 10.3* 8.2* 7.7*    Basename 07/17/11 0530 07/16/11 0500  WBC 7.0 6.4  RBC 3.33* 2.63*  HCT 29.9* 24.0*  PLT 102* 94*    Basename 07/17/11 0530 07/16/11 0500  NA 138 136  K 3.8 3.9  CL 107 105  CO2 25 24  BUN 20 33*  CREATININE 0.62 0.86  GLUCOSE 117* 110*  CALCIUM 8.1* 8.0*    Basename 07/16/11 0500 07/15/11 0500  LABPT -- --  INR 1.56* 1.21      Physical Exam  Gen: awake alert, NAD Lungs: Clear  Cardiac: S1 and S2, + systolic murmur Abd: + BS Ext: R LEx  Incisions look fantastic  No dct  No asymmetric swelling  Motor and sensory functions intact  Ext warm   + DP pulse   Imaging No results found.  Assessment/Plan: 3 Days Post-Op Procedure(s) (LRB): INTRAMEDULLARY (IM) NAIL FEMORAL (Right)  77 y/o female s/p fall  1. R IT femur fx s/p IMN  Continue with PT/OT  dsg changes as needed  No ROM restrictions  Stable for SNF 2. Dvt/pe prophylaxis  lovenox 3. ABL anemia  Improved s/p PRBC's  Continue to monitor 4. Continue per medicine   Mearl Latin, PA-C 07/17/2011, 9:11 AM

## 2011-07-18 LAB — CBC
Hemoglobin: 10.6 g/dL — ABNORMAL LOW (ref 12.0–15.0)
MCH: 31.2 pg (ref 26.0–34.0)
MCV: 92.4 fL (ref 78.0–100.0)
Platelets: 121 10*3/uL — ABNORMAL LOW (ref 150–400)
RBC: 3.4 MIL/uL — ABNORMAL LOW (ref 3.87–5.11)
WBC: 6.5 10*3/uL (ref 4.0–10.5)

## 2011-07-18 MED ORDER — PROSIGHT PO TABS
1.0000 | ORAL_TABLET | Freq: Every day | ORAL | Status: DC
  Administered 2011-07-18: 1 via ORAL
  Filled 2011-07-18: qty 1

## 2011-07-18 NOTE — Progress Notes (Signed)
Occupational Therapy Treatment Patient Details Name: [name] MRN: [id number] DOB: Dec 07, 1919 Today's Date: 07/18/2011  OT Assessment/Plan OT Assessment/Plan OT Frequency: Min 2X/week Follow Up Recommendations: Skilled nursing facility Equipment Recommended: Defer to next venue OT Goals Acute Rehab OT Goals OT Goal Formulation: With patient Time For Goal Achievement: 2 weeks ADL Goals Pt Will Perform Grooming: with modified independence;Standing at sink;Unsupported ADL Goal: Grooming - Progress: Not addressed Pt Will Perform Upper Body Bathing: with modified independence;Sitting, chair;Unsupported ADL Goal: Upper Body Bathing - Progress: Not addressed Pt Will Perform Lower Body Bathing: with min assist;Sit to stand from chair;Unsupported ADL Goal: Lower Body Bathing - Progress: Progressing toward goals Pt Will Perform Upper Body Dressing: with modified independence;Sitting, chair;Unsupported ADL Goal: Upper Body Dressing - Progress: Met Pt Will Perform Lower Body Dressing: with set-up;Sit to stand from chair;with adaptive equipment;Unsupported ADL Goal: Lower Body Dressing - Progress: Progressing toward goals Pt Will Transfer to Toilet: with modified independence;with DME;3-in-1 ADL Goal: Toilet Transfer - Progress: Progressing toward goals Pt Will Perform Toileting - Hygiene: with modified independence;Sit to stand from 3-in-1/toilet;with adaptive equipment ADL Goal: Toileting - Hygiene - Progress: Not addressed Miscellaneous OT Goals Miscellaneous OT Goal #1: pt will verbalize 3 out 2 RLE THP as precursor to ADls   OT Treatment Precautions/Restrictions  Restrictions Weight Bearing Restrictions: Yes RLE Weight Bearing: Weight bearing as tolerated   ADL ADL Upper Body Dressing: Performed;Independent Where Assessed - Upper Body Dressing: Sitting, bed;Unsupported Lower Body Dressing: Performed;Minimal assistance Lower Body Dressing Details (indicate cue type and  reason): Min assist to doff R sock. Where Assessed - Lower Body Dressing: Sitting, bed Toilet Transfer: Simulated;Minimal assistance Toilet Transfer Method: Ambulating Equipment Used: Rolling walker Mobility  Bed Mobility Bed Mobility: Yes Supine to Sit: 4: Min assist;HOB flat;With rails Supine to Sit Details (indicate cue type and reason): cues to sequence and assist R LE to EOB Sitting - Scoot to Edge of Bed: 5: Supervision Transfers Transfers: Yes Sit to Stand: 3: Mod assist;From bed Sit to Stand Details (indicate cue type and reason): cues for safe technique and hand placement Stand to Sit: 4: Min assist;To chair/3-in-1;With armrests Stand to Sit Details: cues to sequence Exercises    End of Session OT - End of Session Equipment Utilized During Treatment: Gait belt;Other (comment) Activity Tolerance: Patient tolerated treatment well Patient left: in chair;with call bell in reach General Behavior During Session: [facility] for tasks performed Cognition: [facility] for tasks performed  Cipriano Mile  07/18/2011, 10:07 AM 07/18/2011 Cipriano Mile OTR/L Pager [phone] Office [phone]

## 2011-07-18 NOTE — Progress Notes (Signed)
Mrs. [name] was discharged today to [facility] for ST rehab. Discharge information faxed to facility and Ms. Little (caregiver) and daughter [name] aware of discharge. Patient was transported to facility by ambulance.  Genelle Bal, MSW, LCSW [phone]

## 2011-07-18 NOTE — Progress Notes (Signed)
Subjective: 4 Days Post-Op Procedure(s) (LRB): INTRAMEDULLARY (IM) NAIL FEMORAL (Right)  Patient is doing well no major complaints.  Orthopaedically she is stable. She does have some reservations about going to ask in place which she wants to discuss with the social worker and case Production designer, theatre/television/film. Patient denies any chest pain,  no shortness of breath,  no nausea,  no vomiting no diarrhea Patient tolerating diet well. Objective: Current Vitals Blood pressure 157/56, pulse 75, temperature 98.6 F (37 C), temperature source Oral, resp. rate 16, height 5\' 1"  (1.549 m), weight 66.2 kg (145 lb 15.1 oz), SpO2 97.00%. Vital signs in last 24 hours: Temp:  [98.6 F (37 C)-98.9 F (37.2 C)] 98.6 F (37 C) (11/23 0630) Pulse Rate:  [70-77] 75  (11/23 0630) Resp:  [16-18] 16  (11/23 0630) BP: (122-157)/(45-56) 157/56 mmHg (11/23 0630) SpO2:  [93 %-97 %] 97 % (11/23 0630)  Intake/Output from previous day: 11/22 0701 - 11/23 0700 In: 1210 [P.O.:960; I.V.:250] Out: -   LABS  Basename 07/18/11 0600 07/17/11 0530 07/16/11 0500  HGB 10.6* 10.3* 8.2*    Basename 07/18/11 0600 07/17/11 0530  WBC 6.5 7.0  RBC 3.40* 3.33*  HCT 31.4* 29.9*  PLT 121* 102*    Basename 07/17/11 0530 07/16/11 0500  NA 138 136  K 3.8 3.9  CL 107 105  CO2 25 24  BUN 20 33*  CREATININE 0.62 0.86  GLUCOSE 117* 110*  CALCIUM 8.1* 8.0*    Basename 07/16/11 0500  LABPT --  INR 1.56*      Physical Exam  Gen: Sitting in chair, no acute distress, very pleasant. Lungs: Clear Cardiac: S1 and S2 Abd: + Bowel sounds Ext: Right lower extremity  Incisions and dressings are pristine.  Distal motor and sensory functions are intact.  No deep calf tenderness is noted.  Palpable dorsalis pedis pulses noted.  Extremity is warm.  Patient demonstrates active motion of right knee.   Imaging No results found.  Assessment/Plan: 4 Days Post-Op Procedure(s) (LRB): INTRAMEDULLARY (IM) NAIL FEMORAL (Right)   76 year old female status post fall  1. right intertrochanteric femur fracture status post IM nail  Continue with PT OT  Dressing changes as needed  No range of motion restrictions  Orthopedically stable for transfer to skilled nursing facility when [facility] deems appropriate 2. DVT and PE prophylaxis  Lovenox 3. acute blood loss anemia  Stable 4. continue per medicine  Mearl Latin, PA-C 07/18/2011, 12:44 PM

## 2011-07-18 NOTE — Progress Notes (Signed)
Physical Therapy Treatment Patient Details Name: [name] MRN: [id number] DOB: 1920/05/03 Today's Date: 07/18/2011  PT Assessment/Plan  PT - Assessment/Plan PT Plan: Discharge plan remains appropriate PT Frequency: Min 6X/week Follow Up Recommendations: Skilled nursing facility Equipment Recommended: Defer to next venue PT Goals  Acute Rehab PT Goals PT Goal: Supine/Side to Sit - Progress: Progressing toward goal PT Transfer Goal: Sit to Stand/Stand to Sit - Progress: Progressing toward goal PT Goal: Ambulate - Progress: Progressing toward goal PT Goal: Perform Home Exercise Program - Progress: Progressing toward goal  PT Treatment Precautions/Restrictions  Precautions Precautions: Fall Precaution Comments: Pt does not require total hip precautions secondary to IM nail and confirmed with Montez Morita Required Braces or Orthoses: No Restrictions Weight Bearing Restrictions: Yes RLE Weight Bearing: Weight bearing as tolerated Mobility (including Balance) Bed Mobility Bed Mobility: Yes Supine to Sit: 4: Min assist;HOB flat;With rails Supine to Sit Details (indicate cue type and reason): A for RLE. Cues for safe technique Sitting - Scoot to Edge of Bed: 5: Supervision Transfers Transfers: Yes Sit to Stand: 3: Mod assist;With upper extremity assist;From bed Sit to Stand Details (indicate cue type and reason): A to initiate stand and to ensure balance. Cues for safe technique and hand placement Stand to Sit: 4: Min assist;To chair/3-in-1;With armrests Stand to Sit Details: Cues for safe technique and to sit slowly Ambulation/Gait Ambulation/Gait: Yes Ambulation/Gait Assistance: 4: Min assist Ambulation/Gait Assistance Details (indicate cue type and reason): Cues for gait sequence and posture. A for balance Ambulation Distance (Feet): 75 Feet Assistive device: Rolling walker Gait Pattern: Step-through pattern;Decreased stride length Stairs: No Wheelchair  Mobility Wheelchair Mobility: No    Exercise  Total Joint Exercises Ankle Circles/Pumps: AROM;Right;10 reps;Supine Quad Sets: Supine;Right;10 reps;AROM Heel Slides: Right;10 reps;AAROM;Supine Hip ABduction/ADduction: AAROM;Right;10 reps;Supine End of Session PT - End of Session Equipment Utilized During Treatment: Gait belt Patient left: in chair;with call bell in reach Nurse Communication: Mobility status for transfers;Mobility status for ambulation General Cognition: [facility] for tasks performed  Robinette, Adline Potter 07/18/2011, 1:11 PM  07/18/2011 Fredrich Birks PTA [phone] pager [phone] office

## 2011-08-26 DIAGNOSIS — M6281 Muscle weakness (generalized): Secondary | ICD-10-CM | POA: Diagnosis not present

## 2011-08-26 DIAGNOSIS — I059 Rheumatic mitral valve disease, unspecified: Secondary | ICD-10-CM | POA: Diagnosis not present

## 2011-08-26 DIAGNOSIS — I359 Nonrheumatic aortic valve disorder, unspecified: Secondary | ICD-10-CM | POA: Diagnosis not present

## 2011-08-26 DIAGNOSIS — Z950 Presence of cardiac pacemaker: Secondary | ICD-10-CM | POA: Diagnosis not present

## 2011-08-26 DIAGNOSIS — R262 Difficulty in walking, not elsewhere classified: Secondary | ICD-10-CM | POA: Diagnosis not present

## 2011-08-26 DIAGNOSIS — S72143A Displaced intertrochanteric fracture of unspecified femur, initial encounter for closed fracture: Secondary | ICD-10-CM | POA: Diagnosis not present

## 2011-08-26 DIAGNOSIS — Z5189 Encounter for other specified aftercare: Secondary | ICD-10-CM | POA: Diagnosis not present

## 2011-08-26 DIAGNOSIS — Z4789 Encounter for other orthopedic aftercare: Secondary | ICD-10-CM | POA: Diagnosis not present

## 2011-08-26 DIAGNOSIS — Z9181 History of falling: Secondary | ICD-10-CM | POA: Diagnosis not present

## 2011-08-26 DIAGNOSIS — D62 Acute posthemorrhagic anemia: Secondary | ICD-10-CM | POA: Diagnosis not present

## 2011-08-26 DIAGNOSIS — R269 Unspecified abnormalities of gait and mobility: Secondary | ICD-10-CM | POA: Diagnosis not present

## 2011-09-25 DIAGNOSIS — S72143A Displaced intertrochanteric fracture of unspecified femur, initial encounter for closed fracture: Secondary | ICD-10-CM | POA: Diagnosis not present

## 2011-09-25 DIAGNOSIS — Z4789 Encounter for other orthopedic aftercare: Secondary | ICD-10-CM | POA: Diagnosis not present

## 2011-10-02 DIAGNOSIS — S7290XD Unspecified fracture of unspecified femur, subsequent encounter for closed fracture with routine healing: Secondary | ICD-10-CM | POA: Diagnosis not present

## 2011-10-02 DIAGNOSIS — M5137 Other intervertebral disc degeneration, lumbosacral region: Secondary | ICD-10-CM | POA: Diagnosis not present

## 2011-10-02 DIAGNOSIS — I5022 Chronic systolic (congestive) heart failure: Secondary | ICD-10-CM | POA: Diagnosis not present

## 2011-10-02 DIAGNOSIS — M47817 Spondylosis without myelopathy or radiculopathy, lumbosacral region: Secondary | ICD-10-CM | POA: Diagnosis not present

## 2011-10-02 DIAGNOSIS — F329 Major depressive disorder, single episode, unspecified: Secondary | ICD-10-CM | POA: Diagnosis not present

## 2011-10-04 DIAGNOSIS — S7290XD Unspecified fracture of unspecified femur, subsequent encounter for closed fracture with routine healing: Secondary | ICD-10-CM | POA: Diagnosis not present

## 2011-10-04 DIAGNOSIS — I5022 Chronic systolic (congestive) heart failure: Secondary | ICD-10-CM | POA: Diagnosis not present

## 2011-10-04 DIAGNOSIS — M5137 Other intervertebral disc degeneration, lumbosacral region: Secondary | ICD-10-CM | POA: Diagnosis not present

## 2011-10-04 DIAGNOSIS — M47817 Spondylosis without myelopathy or radiculopathy, lumbosacral region: Secondary | ICD-10-CM | POA: Diagnosis not present

## 2011-10-04 DIAGNOSIS — F329 Major depressive disorder, single episode, unspecified: Secondary | ICD-10-CM | POA: Diagnosis not present

## 2011-10-05 DIAGNOSIS — S7290XD Unspecified fracture of unspecified femur, subsequent encounter for closed fracture with routine healing: Secondary | ICD-10-CM | POA: Diagnosis not present

## 2011-10-05 DIAGNOSIS — M5137 Other intervertebral disc degeneration, lumbosacral region: Secondary | ICD-10-CM | POA: Diagnosis not present

## 2011-10-05 DIAGNOSIS — F329 Major depressive disorder, single episode, unspecified: Secondary | ICD-10-CM | POA: Diagnosis not present

## 2011-10-05 DIAGNOSIS — M47817 Spondylosis without myelopathy or radiculopathy, lumbosacral region: Secondary | ICD-10-CM | POA: Diagnosis not present

## 2011-10-05 DIAGNOSIS — I5022 Chronic systolic (congestive) heart failure: Secondary | ICD-10-CM | POA: Diagnosis not present

## 2011-10-07 DIAGNOSIS — F329 Major depressive disorder, single episode, unspecified: Secondary | ICD-10-CM | POA: Diagnosis not present

## 2011-10-07 DIAGNOSIS — I5022 Chronic systolic (congestive) heart failure: Secondary | ICD-10-CM | POA: Diagnosis not present

## 2011-10-07 DIAGNOSIS — S7290XD Unspecified fracture of unspecified femur, subsequent encounter for closed fracture with routine healing: Secondary | ICD-10-CM | POA: Diagnosis not present

## 2011-10-07 DIAGNOSIS — M5137 Other intervertebral disc degeneration, lumbosacral region: Secondary | ICD-10-CM | POA: Diagnosis not present

## 2011-10-07 DIAGNOSIS — M47817 Spondylosis without myelopathy or radiculopathy, lumbosacral region: Secondary | ICD-10-CM | POA: Diagnosis not present

## 2011-10-08 DIAGNOSIS — I10 Essential (primary) hypertension: Secondary | ICD-10-CM | POA: Diagnosis not present

## 2011-10-08 DIAGNOSIS — I359 Nonrheumatic aortic valve disorder, unspecified: Secondary | ICD-10-CM | POA: Diagnosis not present

## 2011-10-08 DIAGNOSIS — Z950 Presence of cardiac pacemaker: Secondary | ICD-10-CM | POA: Diagnosis not present

## 2011-10-08 DIAGNOSIS — E785 Hyperlipidemia, unspecified: Secondary | ICD-10-CM | POA: Diagnosis not present

## 2011-10-08 DIAGNOSIS — E782 Mixed hyperlipidemia: Secondary | ICD-10-CM | POA: Diagnosis not present

## 2011-10-08 DIAGNOSIS — I509 Heart failure, unspecified: Secondary | ICD-10-CM | POA: Diagnosis not present

## 2011-10-08 DIAGNOSIS — I714 Abdominal aortic aneurysm, without rupture: Secondary | ICD-10-CM | POA: Diagnosis not present

## 2011-10-08 DIAGNOSIS — Z1331 Encounter for screening for depression: Secondary | ICD-10-CM | POA: Diagnosis not present

## 2011-10-08 DIAGNOSIS — R0609 Other forms of dyspnea: Secondary | ICD-10-CM | POA: Diagnosis not present

## 2011-10-08 DIAGNOSIS — I059 Rheumatic mitral valve disease, unspecified: Secondary | ICD-10-CM | POA: Diagnosis not present

## 2011-10-08 DIAGNOSIS — I447 Left bundle-branch block, unspecified: Secondary | ICD-10-CM | POA: Diagnosis not present

## 2011-10-09 DIAGNOSIS — I5022 Chronic systolic (congestive) heart failure: Secondary | ICD-10-CM | POA: Diagnosis not present

## 2011-10-09 DIAGNOSIS — M5137 Other intervertebral disc degeneration, lumbosacral region: Secondary | ICD-10-CM | POA: Diagnosis not present

## 2011-10-09 DIAGNOSIS — F329 Major depressive disorder, single episode, unspecified: Secondary | ICD-10-CM | POA: Diagnosis not present

## 2011-10-09 DIAGNOSIS — M47817 Spondylosis without myelopathy or radiculopathy, lumbosacral region: Secondary | ICD-10-CM | POA: Diagnosis not present

## 2011-10-09 DIAGNOSIS — S7290XD Unspecified fracture of unspecified femur, subsequent encounter for closed fracture with routine healing: Secondary | ICD-10-CM | POA: Diagnosis not present

## 2011-10-13 ENCOUNTER — Other Ambulatory Visit: Payer: Self-pay | Admitting: Internal Medicine

## 2011-10-13 DIAGNOSIS — N6310 Unspecified lump in the right breast, unspecified quadrant: Secondary | ICD-10-CM

## 2011-10-14 DIAGNOSIS — S7290XD Unspecified fracture of unspecified femur, subsequent encounter for closed fracture with routine healing: Secondary | ICD-10-CM | POA: Diagnosis not present

## 2011-10-14 DIAGNOSIS — I5022 Chronic systolic (congestive) heart failure: Secondary | ICD-10-CM | POA: Diagnosis not present

## 2011-10-14 DIAGNOSIS — M5137 Other intervertebral disc degeneration, lumbosacral region: Secondary | ICD-10-CM | POA: Diagnosis not present

## 2011-10-14 DIAGNOSIS — M47817 Spondylosis without myelopathy or radiculopathy, lumbosacral region: Secondary | ICD-10-CM | POA: Diagnosis not present

## 2011-10-14 DIAGNOSIS — F329 Major depressive disorder, single episode, unspecified: Secondary | ICD-10-CM | POA: Diagnosis not present

## 2011-10-16 DIAGNOSIS — M5137 Other intervertebral disc degeneration, lumbosacral region: Secondary | ICD-10-CM | POA: Diagnosis not present

## 2011-10-16 DIAGNOSIS — M47817 Spondylosis without myelopathy or radiculopathy, lumbosacral region: Secondary | ICD-10-CM | POA: Diagnosis not present

## 2011-10-16 DIAGNOSIS — S7290XD Unspecified fracture of unspecified femur, subsequent encounter for closed fracture with routine healing: Secondary | ICD-10-CM | POA: Diagnosis not present

## 2011-10-16 DIAGNOSIS — F329 Major depressive disorder, single episode, unspecified: Secondary | ICD-10-CM | POA: Diagnosis not present

## 2011-10-16 DIAGNOSIS — I5022 Chronic systolic (congestive) heart failure: Secondary | ICD-10-CM | POA: Diagnosis not present

## 2011-10-20 DIAGNOSIS — F329 Major depressive disorder, single episode, unspecified: Secondary | ICD-10-CM | POA: Diagnosis not present

## 2011-10-20 DIAGNOSIS — M5137 Other intervertebral disc degeneration, lumbosacral region: Secondary | ICD-10-CM | POA: Diagnosis not present

## 2011-10-20 DIAGNOSIS — M47817 Spondylosis without myelopathy or radiculopathy, lumbosacral region: Secondary | ICD-10-CM | POA: Diagnosis not present

## 2011-10-20 DIAGNOSIS — S7290XD Unspecified fracture of unspecified femur, subsequent encounter for closed fracture with routine healing: Secondary | ICD-10-CM | POA: Diagnosis not present

## 2011-10-20 DIAGNOSIS — I5022 Chronic systolic (congestive) heart failure: Secondary | ICD-10-CM | POA: Diagnosis not present

## 2011-10-21 ENCOUNTER — Ambulatory Visit
Admission: RE | Admit: 2011-10-21 | Discharge: 2011-10-21 | Disposition: A | Discharge status: Home / Self Care | Payer: Medicare Other | Source: Ambulatory Visit | Attending: Internal Medicine | Admitting: Internal Medicine

## 2011-10-21 ENCOUNTER — Other Ambulatory Visit: Payer: Self-pay | Admitting: Internal Medicine

## 2011-10-21 DIAGNOSIS — N6001 Solitary cyst of right breast: Secondary | ICD-10-CM

## 2011-10-21 DIAGNOSIS — N6310 Unspecified lump in the right breast, unspecified quadrant: Secondary | ICD-10-CM

## 2011-10-21 DIAGNOSIS — S7290XD Unspecified fracture of unspecified femur, subsequent encounter for closed fracture with routine healing: Secondary | ICD-10-CM | POA: Diagnosis not present

## 2011-10-21 DIAGNOSIS — F329 Major depressive disorder, single episode, unspecified: Secondary | ICD-10-CM | POA: Diagnosis not present

## 2011-10-21 DIAGNOSIS — I5022 Chronic systolic (congestive) heart failure: Secondary | ICD-10-CM | POA: Diagnosis not present

## 2011-10-21 DIAGNOSIS — M47817 Spondylosis without myelopathy or radiculopathy, lumbosacral region: Secondary | ICD-10-CM | POA: Diagnosis not present

## 2011-10-21 DIAGNOSIS — N6009 Solitary cyst of unspecified breast: Secondary | ICD-10-CM | POA: Diagnosis not present

## 2011-10-21 DIAGNOSIS — M5137 Other intervertebral disc degeneration, lumbosacral region: Secondary | ICD-10-CM | POA: Diagnosis not present

## 2011-10-23 DIAGNOSIS — M47817 Spondylosis without myelopathy or radiculopathy, lumbosacral region: Secondary | ICD-10-CM | POA: Diagnosis not present

## 2011-10-23 DIAGNOSIS — S7290XD Unspecified fracture of unspecified femur, subsequent encounter for closed fracture with routine healing: Secondary | ICD-10-CM | POA: Diagnosis not present

## 2011-10-23 DIAGNOSIS — I5022 Chronic systolic (congestive) heart failure: Secondary | ICD-10-CM | POA: Diagnosis not present

## 2011-10-23 DIAGNOSIS — F329 Major depressive disorder, single episode, unspecified: Secondary | ICD-10-CM | POA: Diagnosis not present

## 2011-10-23 DIAGNOSIS — M5137 Other intervertebral disc degeneration, lumbosacral region: Secondary | ICD-10-CM | POA: Diagnosis not present

## 2011-10-27 DIAGNOSIS — D485 Neoplasm of uncertain behavior of skin: Secondary | ICD-10-CM | POA: Diagnosis not present

## 2011-10-28 DIAGNOSIS — M47817 Spondylosis without myelopathy or radiculopathy, lumbosacral region: Secondary | ICD-10-CM | POA: Diagnosis not present

## 2011-10-28 DIAGNOSIS — F329 Major depressive disorder, single episode, unspecified: Secondary | ICD-10-CM | POA: Diagnosis not present

## 2011-10-28 DIAGNOSIS — M5137 Other intervertebral disc degeneration, lumbosacral region: Secondary | ICD-10-CM | POA: Diagnosis not present

## 2011-10-28 DIAGNOSIS — S7290XD Unspecified fracture of unspecified femur, subsequent encounter for closed fracture with routine healing: Secondary | ICD-10-CM | POA: Diagnosis not present

## 2011-10-28 DIAGNOSIS — I5022 Chronic systolic (congestive) heart failure: Secondary | ICD-10-CM | POA: Diagnosis not present

## 2011-10-30 DIAGNOSIS — M5137 Other intervertebral disc degeneration, lumbosacral region: Secondary | ICD-10-CM | POA: Diagnosis not present

## 2011-10-30 DIAGNOSIS — M47817 Spondylosis without myelopathy or radiculopathy, lumbosacral region: Secondary | ICD-10-CM | POA: Diagnosis not present

## 2011-10-30 DIAGNOSIS — I5022 Chronic systolic (congestive) heart failure: Secondary | ICD-10-CM | POA: Diagnosis not present

## 2011-10-30 DIAGNOSIS — F329 Major depressive disorder, single episode, unspecified: Secondary | ICD-10-CM | POA: Diagnosis not present

## 2011-10-30 DIAGNOSIS — S7290XD Unspecified fracture of unspecified femur, subsequent encounter for closed fracture with routine healing: Secondary | ICD-10-CM | POA: Diagnosis not present

## 2011-11-03 DIAGNOSIS — S7290XD Unspecified fracture of unspecified femur, subsequent encounter for closed fracture with routine healing: Secondary | ICD-10-CM | POA: Diagnosis not present

## 2011-11-03 DIAGNOSIS — M47817 Spondylosis without myelopathy or radiculopathy, lumbosacral region: Secondary | ICD-10-CM | POA: Diagnosis not present

## 2011-11-03 DIAGNOSIS — M5137 Other intervertebral disc degeneration, lumbosacral region: Secondary | ICD-10-CM | POA: Diagnosis not present

## 2011-11-03 DIAGNOSIS — I5022 Chronic systolic (congestive) heart failure: Secondary | ICD-10-CM | POA: Diagnosis not present

## 2011-11-03 DIAGNOSIS — F329 Major depressive disorder, single episode, unspecified: Secondary | ICD-10-CM | POA: Diagnosis not present

## 2011-11-06 ENCOUNTER — Emergency Department (HOSPITAL_COMMUNITY): Payer: Medicare Other

## 2011-11-06 ENCOUNTER — Other Ambulatory Visit: Payer: Self-pay

## 2011-11-06 ENCOUNTER — Encounter (HOSPITAL_COMMUNITY): Payer: Self-pay | Admitting: *Deleted

## 2011-11-06 ENCOUNTER — Inpatient Hospital Stay (HOSPITAL_COMMUNITY)
Admission: EM | Admit: 2011-11-06 | Discharge: 2011-11-18 | DRG: 330 | Disposition: A | Discharge status: Skilled Nursing Facility | Payer: Medicare Other | Attending: General Surgery | Admitting: General Surgery

## 2011-11-06 DIAGNOSIS — S7290XD Unspecified fracture of unspecified femur, subsequent encounter for closed fracture with routine healing: Secondary | ICD-10-CM | POA: Diagnosis not present

## 2011-11-06 DIAGNOSIS — F341 Dysthymic disorder: Secondary | ICD-10-CM | POA: Diagnosis present

## 2011-11-06 DIAGNOSIS — M47817 Spondylosis without myelopathy or radiculopathy, lumbosacral region: Secondary | ICD-10-CM | POA: Diagnosis not present

## 2011-11-06 DIAGNOSIS — Z79899 Other long term (current) drug therapy: Secondary | ICD-10-CM | POA: Diagnosis not present

## 2011-11-06 DIAGNOSIS — R1084 Generalized abdominal pain: Secondary | ICD-10-CM | POA: Diagnosis not present

## 2011-11-06 DIAGNOSIS — Z9889 Other specified postprocedural states: Secondary | ICD-10-CM | POA: Diagnosis not present

## 2011-11-06 DIAGNOSIS — I509 Heart failure, unspecified: Secondary | ICD-10-CM | POA: Diagnosis not present

## 2011-11-06 DIAGNOSIS — R112 Nausea with vomiting, unspecified: Secondary | ICD-10-CM

## 2011-11-06 DIAGNOSIS — R188 Other ascites: Secondary | ICD-10-CM | POA: Diagnosis present

## 2011-11-06 DIAGNOSIS — Z7982 Long term (current) use of aspirin: Secondary | ICD-10-CM | POA: Diagnosis not present

## 2011-11-06 DIAGNOSIS — R109 Unspecified abdominal pain: Secondary | ICD-10-CM | POA: Diagnosis not present

## 2011-11-06 DIAGNOSIS — I714 Abdominal aortic aneurysm, without rupture, unspecified: Secondary | ICD-10-CM | POA: Diagnosis present

## 2011-11-06 DIAGNOSIS — I10 Essential (primary) hypertension: Secondary | ICD-10-CM | POA: Diagnosis present

## 2011-11-06 DIAGNOSIS — I428 Other cardiomyopathies: Secondary | ICD-10-CM | POA: Diagnosis present

## 2011-11-06 DIAGNOSIS — F329 Major depressive disorder, single episode, unspecified: Secondary | ICD-10-CM | POA: Diagnosis not present

## 2011-11-06 DIAGNOSIS — K551 Chronic vascular disorders of intestine: Secondary | ICD-10-CM | POA: Diagnosis present

## 2011-11-06 DIAGNOSIS — Z5189 Encounter for other specified aftercare: Secondary | ICD-10-CM | POA: Diagnosis not present

## 2011-11-06 DIAGNOSIS — K4090 Unilateral inguinal hernia, without obstruction or gangrene, not specified as recurrent: Secondary | ICD-10-CM | POA: Diagnosis not present

## 2011-11-06 DIAGNOSIS — K56609 Unspecified intestinal obstruction, unspecified as to partial versus complete obstruction: Secondary | ICD-10-CM | POA: Diagnosis present

## 2011-11-06 DIAGNOSIS — Z09 Encounter for follow-up examination after completed treatment for conditions other than malignant neoplasm: Secondary | ICD-10-CM | POA: Diagnosis not present

## 2011-11-06 DIAGNOSIS — E876 Hypokalemia: Secondary | ICD-10-CM | POA: Diagnosis not present

## 2011-11-06 DIAGNOSIS — K4030 Unilateral inguinal hernia, with obstruction, without gangrene, not specified as recurrent: Secondary | ICD-10-CM

## 2011-11-06 DIAGNOSIS — M5137 Other intervertebral disc degeneration, lumbosacral region: Secondary | ICD-10-CM | POA: Diagnosis not present

## 2011-11-06 DIAGNOSIS — K565 Intestinal adhesions [bands], unspecified as to partial versus complete obstruction: Principal | ICD-10-CM | POA: Diagnosis present

## 2011-11-06 DIAGNOSIS — I359 Nonrheumatic aortic valve disorder, unspecified: Secondary | ICD-10-CM | POA: Diagnosis present

## 2011-11-06 DIAGNOSIS — IMO0001 Reserved for inherently not codable concepts without codable children: Secondary | ICD-10-CM | POA: Diagnosis not present

## 2011-11-06 DIAGNOSIS — E872 Acidosis: Secondary | ICD-10-CM | POA: Diagnosis present

## 2011-11-06 DIAGNOSIS — I5022 Chronic systolic (congestive) heart failure: Secondary | ICD-10-CM | POA: Insufficient documentation

## 2011-11-06 DIAGNOSIS — Z950 Presence of cardiac pacemaker: Secondary | ICD-10-CM | POA: Diagnosis not present

## 2011-11-06 DIAGNOSIS — K5289 Other specified noninfective gastroenteritis and colitis: Secondary | ICD-10-CM | POA: Diagnosis not present

## 2011-11-06 DIAGNOSIS — E785 Hyperlipidemia, unspecified: Secondary | ICD-10-CM | POA: Diagnosis present

## 2011-11-06 DIAGNOSIS — I110 Hypertensive heart disease with heart failure: Secondary | ICD-10-CM | POA: Diagnosis present

## 2011-11-06 DIAGNOSIS — Z01811 Encounter for preprocedural respiratory examination: Secondary | ICD-10-CM | POA: Diagnosis not present

## 2011-11-06 DIAGNOSIS — M6281 Muscle weakness (generalized): Secondary | ICD-10-CM | POA: Diagnosis not present

## 2011-11-06 DIAGNOSIS — R262 Difficulty in walking, not elsewhere classified: Secondary | ICD-10-CM | POA: Diagnosis not present

## 2011-11-06 DIAGNOSIS — I517 Cardiomegaly: Secondary | ICD-10-CM | POA: Diagnosis not present

## 2011-11-06 DIAGNOSIS — E860 Dehydration: Secondary | ICD-10-CM | POA: Diagnosis present

## 2011-11-06 DIAGNOSIS — K55059 Acute (reversible) ischemia of intestine, part and extent unspecified: Secondary | ICD-10-CM | POA: Diagnosis not present

## 2011-11-06 LAB — COMPREHENSIVE METABOLIC PANEL
AST: 25 U/L (ref 0–37)
BUN: 30 mg/dL — ABNORMAL HIGH (ref 6–23)
CO2: 24 mEq/L (ref 19–32)
Calcium: 10.5 mg/dL (ref 8.4–10.5)
Chloride: 99 mEq/L (ref 96–112)
Creatinine, Ser: 0.76 mg/dL (ref 0.50–1.10)
GFR calc Af Amer: 84 mL/min — ABNORMAL LOW (ref >90)
GFR calc non Af Amer: 73 mL/min — ABNORMAL LOW (ref >90)
Glucose, Bld: 181 mg/dL — ABNORMAL HIGH (ref 70–99)
Total Bilirubin: 0.5 mg/dL (ref 0.3–1.2)

## 2011-11-06 LAB — CBC
HCT: 39.8 % (ref 36.0–46.0)
Hemoglobin: 13.4 g/dL (ref 12.0–15.0)
MCHC: 34 g/dL (ref 30.0–36.0)
MCV: 96.4 fL (ref 78.0–100.0)
MCV: 97.2 fL (ref 78.0–100.0)
Platelets: 262 10*3/uL (ref 150–400)
RDW: 14.1 % (ref 11.5–15.5)
RDW: 14.5 % (ref 11.5–15.5)
WBC: 12.4 10*3/uL — ABNORMAL HIGH (ref 4.0–10.5)
WBC: 14.9 10*3/uL — ABNORMAL HIGH (ref 4.0–10.5)

## 2011-11-06 LAB — URINALYSIS, ROUTINE W REFLEX MICROSCOPIC
Glucose, UA: NEGATIVE mg/dL
Ketones, ur: 15 mg/dL — AB
Leukocytes, UA: NEGATIVE
Protein, ur: NEGATIVE mg/dL
Urobilinogen, UA: 0.2 mg/dL (ref 0.0–1.0)

## 2011-11-06 LAB — DIFFERENTIAL
Eosinophils Relative: 0 % (ref 0–5)
Lymphocytes Relative: 8 % — ABNORMAL LOW (ref 12–46)
Monocytes Absolute: 0.7 10*3/uL (ref 0.1–1.0)
Monocytes Relative: 6 % (ref 3–12)
Neutro Abs: 10.6 10*3/uL — ABNORMAL HIGH (ref 1.7–7.7)

## 2011-11-06 LAB — LIPASE, BLOOD: Lipase: 17 U/L (ref 11–59)

## 2011-11-06 LAB — LACTIC ACID, PLASMA: Lactic Acid, Venous: 2.8 mmol/L — ABNORMAL HIGH (ref 0.5–2.2)

## 2011-11-06 MED ORDER — SODIUM CHLORIDE 0.9 % IV SOLN
INTRAVENOUS | Status: AC
  Administered 2011-11-06: 22:00:00 via INTRAVENOUS

## 2011-11-06 MED ORDER — ONDANSETRON HCL 4 MG PO TABS: 4.0000 mg | ORAL_TABLET | Freq: Four times a day (QID) | ORAL | Status: DC | PRN

## 2011-11-06 MED ORDER — MORPHINE SULFATE 2 MG/ML IJ SOLN
1.0000 mg | INTRAMUSCULAR | Status: DC | PRN
  Administered 2011-11-07 – 2011-11-11 (×6): 1 mg via INTRAVENOUS
  Filled 2011-11-06 (×7): qty 1

## 2011-11-06 MED ORDER — HYDRALAZINE HCL 20 MG/ML IJ SOLN
10.0000 mg | Freq: Four times a day (QID) | INTRAMUSCULAR | Status: DC | PRN
  Administered 2011-11-06: 10 mg via INTRAVENOUS
  Filled 2011-11-06: qty 0.5

## 2011-11-06 MED ORDER — METOCLOPRAMIDE HCL 5 MG/ML IJ SOLN
10.0000 mg | Freq: Once | INTRAMUSCULAR | Status: AC
  Administered 2011-11-06: 10 mg via INTRAVENOUS
  Filled 2011-11-06: qty 2

## 2011-11-06 MED ORDER — IOHEXOL 300 MG/ML  SOLN
80.0000 mL | Freq: Once | INTRAMUSCULAR | Status: AC | PRN
  Administered 2011-11-06: 80 mL via INTRAVENOUS

## 2011-11-06 MED ORDER — PANTOPRAZOLE SODIUM 40 MG IV SOLR
40.0000 mg | Freq: Every day | INTRAVENOUS | Status: DC
  Administered 2011-11-06 – 2011-11-16 (×11): 40 mg via INTRAVENOUS
  Filled 2011-11-06 (×16): qty 40

## 2011-11-06 MED ORDER — PHENOL 1.4 % MT LIQD
2.0000 | OROMUCOSAL | Status: DC | PRN
  Filled 2011-11-06: qty 177

## 2011-11-06 MED ORDER — SODIUM CHLORIDE 0.9 % IV BOLUS (SEPSIS)
1000.0000 mL | Freq: Once | INTRAVENOUS | Status: AC
  Administered 2011-11-06: 1000 mL via INTRAVENOUS

## 2011-11-06 MED ORDER — ACETAMINOPHEN 650 MG RE SUPP
650.0000 mg | Freq: Four times a day (QID) | RECTAL | Status: DC | PRN
  Administered 2011-11-08 – 2011-11-16 (×10): 650 mg via RECTAL
  Filled 2011-11-06 (×11): qty 1

## 2011-11-06 MED ORDER — ACETAMINOPHEN 325 MG PO TABS
650.0000 mg | ORAL_TABLET | Freq: Four times a day (QID) | ORAL | Status: DC | PRN
  Administered 2011-11-06: 650 mg via ORAL
  Filled 2011-11-06: qty 2

## 2011-11-06 MED ORDER — ONDANSETRON HCL 4 MG/2ML IJ SOLN
4.0000 mg | Freq: Once | INTRAMUSCULAR | Status: AC
  Administered 2011-11-06: 4 mg via INTRAVENOUS
  Filled 2011-11-06 (×2): qty 2

## 2011-11-06 MED ORDER — SODIUM CHLORIDE 0.9 % IV SOLN
INTRAVENOUS | Status: DC
  Administered 2011-11-06 – 2011-11-08 (×5): via INTRAVENOUS
  Administered 2011-11-09: 1000 mL via INTRAVENOUS
  Administered 2011-11-09: 10:00:00 via INTRAVENOUS
  Administered 2011-11-10: 1000 mL via INTRAVENOUS
  Administered 2011-11-11: 02:00:00 via INTRAVENOUS
  Administered 2011-11-11: 1000 mL via INTRAVENOUS
  Administered 2011-11-12: 21:00:00 via INTRAVENOUS

## 2011-11-06 MED ORDER — MORPHINE SULFATE 2 MG/ML IJ SOLN
2.0000 mg | Freq: Once | INTRAMUSCULAR | Status: AC
  Administered 2011-11-06: 2 mg via INTRAVENOUS
  Filled 2011-11-06: qty 1

## 2011-11-06 MED ORDER — ONDANSETRON HCL 4 MG/2ML IJ SOLN
4.0000 mg | Freq: Four times a day (QID) | INTRAMUSCULAR | Status: DC | PRN
  Administered 2011-11-09: 4 mg via INTRAVENOUS
  Filled 2011-11-06: qty 2

## 2011-11-06 MED ORDER — IOHEXOL 300 MG/ML  SOLN: 20.0000 mL | INTRAMUSCULAR | Status: AC

## 2011-11-06 MED ORDER — ONDANSETRON HCL 4 MG/2ML IJ SOLN: 4.0000 mg | Freq: Three times a day (TID) | INTRAMUSCULAR | Status: AC | PRN

## 2011-11-06 MED ORDER — HEPARIN SODIUM (PORCINE) 5000 UNIT/ML IJ SOLN
5000.0000 [IU] | Freq: Three times a day (TID) | INTRAMUSCULAR | Status: DC
  Administered 2011-11-06 – 2011-11-11 (×14): 5000 [IU] via SUBCUTANEOUS
  Filled 2011-11-06 (×17): qty 1

## 2011-11-06 NOTE — ED Notes (Signed)
Dr. Lindie Spruce place 26 French NG tube.  Placement verified by Hoy Finlay.  Dark brown/red liquid return.  Awaiting admission.

## 2011-11-06 NOTE — H&P (Signed)
PCP:   Georgann Housekeeper, MD, MD   Chief Complaint:  Abdominal pain, N/V, dehydration.  HPI: 64 year/old female with a past medical history significant for sick sinus syndrome (s/p pacemaker), hld, htn, AAA; CHF (last EF on records from 2008; 50%); came to the hospital secondary to abdominal pain, nausea and vomiting. Patient reports having what appears to be a flare from her well known LIH; but this time pain was worse than before and despite rest and supportive care at home she failed to improved. In the ED CT abdomen and pelvis demonstrated partially incarcerated LIH with concerns for vascular compromise; she was mild dehydrated and with elevated lactic acid.  Internal medicine called for admission due to cormobidities and surgery has also been called to consult on the case.  Patient denies CP, SOB, Ha's; melena, hematemesis, hematochezia or any other complaints.  Allergies:   Allergies  Allergen Reactions  . Ace Inhibitors   . Atorvastatin       Past Medical History  Diagnosis Date  . Aortic valve regurgitation   . Congenital heart disease   . Left bundle branch block   . Hyperlipidemia   . Mitral valve disorder   . Pacemaker     For complete heart block, s/p generator change 09/2010   . Dyspnea   . Anxiety   . Depression   . Aneurysm of abdominal aorta     4.4 cm in 09/2010  . Closed intertrochanteric fracture of right hip 07/13/2011    Past Surgical History  Procedure Date  . Knee arthroscopy   . Pacemaker insertion   . Insert / replace / remove pacemaker   . Femur im nail 07/14/2011    Procedure: INTRAMEDULLARY (IM) NAIL FEMORAL;  Surgeon: Eulas Post;  Location: [facility] OR;  Service: Orthopedics;  Laterality: Right;  Synthes TFN, fracture table, big C arm, available after 4p  . Left ingunial hernia     Prior to Admission medications   Medication Sig Start Date End Date Taking? Authorizing Provider  acetaminophen (TYLENOL) 500 MG tablet Take 500 mg by mouth as  needed. For arthritis pain   Yes Historical Provider, MD  aspirin 81 MG tablet Take 81 mg by mouth daily.     Yes Historical Provider, MD  carvedilol (COREG) 6.25 MG tablet Take 6.25 mg by mouth 2 (two) times daily.     Yes Historical Provider, MD  cholecalciferol (VITAMIN D) 1000 UNITS tablet Take 1,000 Units by mouth daily.     Yes Historical Provider, MD  furosemide (LASIX) 20 MG tablet Take 20 mg by mouth 2 (two) times daily.     Yes Historical Provider, MD  Glucosamine 500 MG CAPS Take 1,500-3,000 mg by mouth daily. 3000 mg daily?   Yes Historical Provider, MD  lactobacillus acidophilus (BACID) TABS Take 1 tablet by mouth daily.   Yes Historical Provider, MD  losartan (COZAAR) 50 MG tablet Take 50 mg by mouth daily.    Yes Historical Provider, MD  Lutein 20 MG CAPS Take 20 mg by mouth daily.    Yes Historical Provider, MD  meclizine (ANTIVERT) 25 MG tablet Take 25 mg by mouth 3 (three) times daily as needed. Nausea/dizziness    Yes Historical Provider, MD  Multiple Vitamins-Minerals (MULTIVITAL) tablet Take 1 tablet by mouth daily.    Yes Historical Provider, MD  oxybutynin (DITROPAN-XL) 10 MG 24 hr tablet Take 10 mg by mouth daily.     Yes Historical Provider, MD  simvastatin (ZOCOR) 20 MG tablet  Take 20 mg by mouth daily.    Yes Historical Provider, MD    Social History:  reports that she has never smoked. She does not have any smokeless tobacco history on file. She reports that she drinks alcohol. She reports that she does not use illicit drugs.  History reviewed. No pertinent family history.  Review of Systems:  Negative except as mentioned on HPI.  Physical Exam: Blood pressure 180/92, pulse 113, temperature 98.3 F (36.8 C), temperature source Axillary, resp. rate 22, weight 51.8 kg (114 lb 3.2 oz), SpO2 93.00%. Constitutional: She is oriented to person, place, and time. Uncomfortable in appeareance; mild dryness of her mucous membranes HENT:  Head: Normocephalic and  atraumatic.  Mouth/Throat: Oropharynx w/o exudate or erythema.  Eyes: Conjunctivae and EOM are normal. Pupils are equal, round, and reactive to light.  Neck: Normal range of motion. Neck supple.  Cardiovascular: Normal rate, regular rhythm and intact distal pulses. Exam reveals no gallop and no friction rub. Positive systolic murmur.  Pulmonary/Chest: Effort normal and breath sounds normal. She has no wheezes. She exhibits no tenderness.  Abdominal: Soft. Bowel sounds are normal. There is tenderness (L periumbilical area and LLQ). There is no rebound and no guarding. [facility] appreciated on exam. Musculoskeletal: Normal range of motion. She exhibits no edema and no tenderness.  Neurological: She is alert and oriented to person, place, and time. No cranial nerve deficit. No focal deficit. Skin: Skin is warm and dry. No rash noted.    Labs on Admission:  Results for orders placed during the hospital encounter of 11/06/11 (from the past 48 hour(s))  CBC     Status: Abnormal   Collection Time   11/06/11  3:59 PM      Component Value Range Comment   WBC 12.4 (*) 4.0 - 10.5 (K/uL)    RBC 4.13  3.87 - 5.11 (MIL/uL)    Hemoglobin 13.4  12.0 - 15.0 (g/dL)    HCT 16.1  09.6 - 04.5 (%)    MCV 96.4  78.0 - 100.0 (fL)    MCH 32.4  26.0 - 34.0 (pg)    MCHC 33.7  30.0 - 36.0 (g/dL)    RDW 40.9  81.1 - 91.4 (%)    Platelets 262  150 - 400 (K/uL)   DIFFERENTIAL     Status: Abnormal   Collection Time   11/06/11  3:59 PM      Component Value Range Comment   Neutrophils Relative 86 (*) 43 - 77 (%)    Neutro Abs 10.6 (*) 1.7 - 7.7 (K/uL)    Lymphocytes Relative 8 (*) 12 - 46 (%)    Lymphs Abs 1.0  0.7 - 4.0 (K/uL)    Monocytes Relative 6  3 - 12 (%)    Monocytes Absolute 0.7  0.1 - 1.0 (K/uL)    Eosinophils Relative 0  0 - 5 (%)    Eosinophils Absolute 0.0  0.0 - 0.7 (K/uL)    Basophils Relative 0  0 - 1 (%)    Basophils Absolute 0.0  0.0 - 0.1 (K/uL)   COMPREHENSIVE METABOLIC PANEL     Status: Abnormal    Collection Time   11/06/11  3:59 PM      Component Value Range Comment   Sodium 140  135 - 145 (mEq/L)    Potassium 3.5  3.5 - 5.1 (mEq/L)    Chloride 99  96 - 112 (mEq/L)    CO2 24  19 - 32 (mEq/L)  Glucose, Bld 181 (*) 70 - 99 (mg/dL)    BUN 30 (*) 6 - 23 (mg/dL)    Creatinine, Ser 4.09  0.50 - 1.10 (mg/dL)    Calcium 81.1  8.4 - 10.5 (mg/dL)    Total Protein 6.8  6.0 - 8.3 (g/dL)    Albumin 3.7  3.5 - 5.2 (g/dL)    AST 25  0 - 37 (U/L)    ALT 11  0 - 35 (U/L)    Alkaline Phosphatase 83  39 - 117 (U/L)    Total Bilirubin 0.5  0.3 - 1.2 (mg/dL)    GFR calc non Af Amer 73 (*) >90 (mL/min)    GFR calc Af Amer 84 (*) >90 (mL/min)   LIPASE, BLOOD     Status: Normal   Collection Time   11/06/11  3:59 PM      Component Value Range Comment   Lipase 17  11 - 59 (U/L)   APTT     Status: Normal   Collection Time   11/06/11  3:59 PM      Component Value Range Comment   aPTT 25  24 - 37 (seconds)   PROTIME-INR     Status: Normal   Collection Time   11/06/11  3:59 PM      Component Value Range Comment   Prothrombin Time 13.3  11.6 - 15.2 (seconds)    INR 0.99  0.00 - 1.49    LACTIC ACID, PLASMA     Status: Abnormal   Collection Time   11/06/11  3:59 PM      Component Value Range Comment   Lactic Acid, Venous 2.8 (*) 0.5 - 2.2 (mmol/L)   POCT I-STAT TROPONIN I     Status: Normal   Collection Time   11/06/11  4:19 PM      Component Value Range Comment   Troponin i, poc 0.03  0.00 - 0.08 (ng/mL)    Comment 3            URINALYSIS, ROUTINE W REFLEX MICROSCOPIC     Status: Abnormal   Collection Time   11/06/11  6:22 PM      Component Value Range Comment   Color, Urine YELLOW  YELLOW     APPearance CLOUDY (*) CLEAR     Specific Gravity, Urine 1.022  1.005 - 1.030     pH 8.0  5.0 - 8.0     Glucose, UA NEGATIVE  NEGATIVE (mg/dL)    Hgb urine dipstick NEGATIVE  NEGATIVE     Bilirubin Urine NEGATIVE  NEGATIVE     Ketones, ur 15 (*) NEGATIVE (mg/dL)    Protein, ur NEGATIVE  NEGATIVE  (mg/dL)    Urobilinogen, UA 0.2  0.0 - 1.0 (mg/dL)    Nitrite NEGATIVE  NEGATIVE     Leukocytes, UA NEGATIVE  NEGATIVE  MICROSCOPIC NOT DONE ON URINES WITH NEGATIVE PROTEIN, BLOOD, LEUKOCYTES, NITRITE, OR GLUCOSE <1000 mg/dL.    Radiological Exams on Admission: Dg Abd 1 View  11/06/2011  *RADIOLOGY REPORT*  Clinical Data: Abdominal pain and nausea.  A small bowel obstruction.  Nasogastric tube placement.  ABDOMEN - 1 VIEW  Comparison: 09/14/2010  Findings: A nasogastric tube is seen which is looped in the stomach, with the tip extending back up into the distal thoracic esophagus.  Mildly dilated small bowel loops are seen.  Contrast is seen within the urinary tract from recent CT.  IMPRESSION: Nasogastric tube is looped in the stomach, with the tip  extending back up into the distal thoracic esophagus.  Original Report Authenticated By: Danae Orleans, M.D.   Ct Abdomen Pelvis W Contrast  11/06/2011  *RADIOLOGY REPORT*  Clinical Data: History of abdominal aortic aneurysm.  Left lower quadrant pain.  Vomiting.  CT ABDOMEN AND PELVIS WITH CONTRAST  Technique:  Multidetector CT imaging of the abdomen and pelvis was performed following the standard protocol during bolus administration of intravenous contrast.  Contrast: 80mL OMNIPAQUE IOHEXOL 300 MG/ML IJ SOLN  Comparison: 09/14/2010  Findings: There is cardiomegaly.  Pacer is in place.  Small hiatal hernia.  Contrast material seen within the distal esophagus which may be related to dysmotility or reflux.  Low-density lesion within the left hepatic lobe, stable, likely cyst.  Liver otherwise unremarkable.  Gallbladder, spleen, pancreas, adrenals and kidneys are unremarkable.  There is a small bowel obstruction.  Transition point is within the mid to distal ileum on image 40 within the right abdomen. The bowel loops proximal to this transition appears thick-walled with edema in the feeding mesentery.  Findings concerning for internal hernia with possible  vascular compromise and bowel wall ischemia.  There is free fluid in the abdomen and pelvis.  Distal small bowel is decompressed.  Appendix is visualized and is normal.  There are is descending colonic and sigmoid diverticulosis.  No active diverticulitis.  Large abdominal aortic aneurysm, measuring maximally 5.7 cm compared 5.2 cm previously.  Uterus, adnexa urinary bladder grossly unremarkable.  Left inguinal hernia present containing fluid.  IMPRESSION: Small bowel obstruction.  I am concerned this may be related to an internal hernia within the right abdomen.  The small bowel loops which are lateral to the ascending colon are thick-walled with stranding in the feeding mesentery.  Findings are concerning for vascular compromise to these bowel loops.  There is a small amount of free fluid in the abdomen and pelvis.  Left colonic diverticulosis.  5.7 cm abdominal aortic aneurysm, slightly larger than prior study.  Cardiomegaly.  These results were called by telephone on 11/06/2011  at  6:20 p.m. to  Dr. Brooke Dare, who verbally acknowledged these results.  Original Report Authenticated By: Cyndie Chime, M.D.     Assessment/Plan 1-Abdominal pain: 2/2 to partially incarcerated hernia and SBO. At this moment will stabilize patient; IVF's; NGT and PRN pain medications. Patient be NPO. Will follow surgery recommendations; but she might need hernia repair. She is moderate to high risk for surgery.  2-Chronic systolic heart failure: stable; and currently compensated. Will follow close I's and O's and daily weight.  3-Hyperlipidemia: plan is to resume statins when able to tolerate PO's.  4-Hypertension: will use hydralazine IV to control BP while unable to take PO.  5-Lactic acid increased: will give IVF's and will follow lactic acid in am.  6-Nausea & vomiting:secondary to SBO due to incarcerated hernia; will keep NPO and use PRN antiemetics.  7-Mild dehydration: will provide IVF's.  8-DVT:heparin   Time  Spent on Admission: 50 minutes  Drisana Schweickert [facility] [phone]  11/06/2011, 9:28 PM

## 2011-11-06 NOTE — Consult Note (Signed)
Reason for Consult:bowel obstruction Referring Physician: [name] is an 78 y.o. female.  HPI: Abruptly starting today at 11:00 AM, nausea, vomiting, and abdominal pain.  According to the patient's caregiver (nurse) who sees her at home regularly, these are symptoms that the patient will get when her known left inguinal hernia comes out.  She has had no fevers or chills, no blood in her stools.  Past Medical History  Diagnosis Date  . Aortic valve regurgitation   . Congenital heart disease   . Left bundle branch block   . Hyperlipidemia   . Mitral valve disorder   . Pacemaker     For complete heart block, s/p generator change 09/2010   . Dyspnea   . Anxiety   . Depression   . Aneurysm of abdominal aorta     4.4 cm in 09/2010  . Closed intertrochanteric fracture of right hip 07/13/2011    Past Surgical History  Procedure Date  . Knee arthroscopy   . Pacemaker insertion   . Insert / replace / remove pacemaker   . Femur im nail 07/14/2011    Procedure: INTRAMEDULLARY (IM) NAIL FEMORAL;  Surgeon: Eulas Post;  Location: [facility] OR;  Service: Orthopedics;  Laterality: Right;  Synthes TFN, fracture table, big C arm, available after 4p  . Left ingunial hernia     History reviewed. No pertinent family history.  Social History:  reports that she has never smoked. She does not have any smokeless tobacco history on file. She reports that she drinks alcohol. She reports that she does not use illicit drugs.  Allergies:  Allergies  Allergen Reactions  . Ace Inhibitors   . Atorvastatin     Medications: I have reviewed the patient's current medications.  Results for orders placed during the hospital encounter of 11/06/11 (from the past 48 hour(s))  CBC     Status: Abnormal   Collection Time   11/06/11  3:59 PM      Component Value Range Comment   WBC 12.4 (*) 4.0 - 10.5 (K/uL)    RBC 4.13  3.87 - 5.11 (MIL/uL)    Hemoglobin 13.4  12.0 - 15.0 (g/dL)    HCT 16.1   09.6 - 04.5 (%)    MCV 96.4  78.0 - 100.0 (fL)    MCH 32.4  26.0 - 34.0 (pg)    MCHC 33.7  30.0 - 36.0 (g/dL)    RDW 40.9  81.1 - 91.4 (%)    Platelets 262  150 - 400 (K/uL)   DIFFERENTIAL     Status: Abnormal   Collection Time   11/06/11  3:59 PM      Component Value Range Comment   Neutrophils Relative 86 (*) 43 - 77 (%)    Neutro Abs 10.6 (*) 1.7 - 7.7 (K/uL)    Lymphocytes Relative 8 (*) 12 - 46 (%)    Lymphs Abs 1.0  0.7 - 4.0 (K/uL)    Monocytes Relative 6  3 - 12 (%)    Monocytes Absolute 0.7  0.1 - 1.0 (K/uL)    Eosinophils Relative 0  0 - 5 (%)    Eosinophils Absolute 0.0  0.0 - 0.7 (K/uL)    Basophils Relative 0  0 - 1 (%)    Basophils Absolute 0.0  0.0 - 0.1 (K/uL)   COMPREHENSIVE METABOLIC PANEL     Status: Abnormal   Collection Time   11/06/11  3:59 PM      Component  Value Range Comment   Sodium 140  135 - 145 (mEq/L)    Potassium 3.5  3.5 - 5.1 (mEq/L)    Chloride 99  96 - 112 (mEq/L)    CO2 24  19 - 32 (mEq/L)    Glucose, Bld 181 (*) 70 - 99 (mg/dL)    BUN 30 (*) 6 - 23 (mg/dL)    Creatinine, Ser 1.61  0.50 - 1.10 (mg/dL)    Calcium 09.6  8.4 - 10.5 (mg/dL)    Total Protein 6.8  6.0 - 8.3 (g/dL)    Albumin 3.7  3.5 - 5.2 (g/dL)    AST 25  0 - 37 (U/L)    ALT 11  0 - 35 (U/L)    Alkaline Phosphatase 83  39 - 117 (U/L)    Total Bilirubin 0.5  0.3 - 1.2 (mg/dL)    GFR calc non Af Amer 73 (*) >90 (mL/min)    GFR calc Af Amer 84 (*) >90 (mL/min)   LIPASE, BLOOD     Status: Normal   Collection Time   11/06/11  3:59 PM      Component Value Range Comment   Lipase 17  11 - 59 (U/L)   APTT     Status: Normal   Collection Time   11/06/11  3:59 PM      Component Value Range Comment   aPTT 25  24 - 37 (seconds)   PROTIME-INR     Status: Normal   Collection Time   11/06/11  3:59 PM      Component Value Range Comment   Prothrombin Time 13.3  11.6 - 15.2 (seconds)    INR 0.99  0.00 - 1.49    LACTIC ACID, PLASMA     Status: Abnormal   Collection Time   11/06/11  3:59  PM      Component Value Range Comment   Lactic Acid, Venous 2.8 (*) 0.5 - 2.2 (mmol/L)   POCT I-STAT TROPONIN I     Status: Normal   Collection Time   11/06/11  4:19 PM      Component Value Range Comment   Troponin i, poc 0.03  0.00 - 0.08 (ng/mL)    Comment 3            URINALYSIS, ROUTINE W REFLEX MICROSCOPIC     Status: Abnormal   Collection Time   11/06/11  6:22 PM      Component Value Range Comment   Color, Urine YELLOW  YELLOW     APPearance CLOUDY (*) CLEAR     Specific Gravity, Urine 1.022  1.005 - 1.030     pH 8.0  5.0 - 8.0     Glucose, UA NEGATIVE  NEGATIVE (mg/dL)    Hgb urine dipstick NEGATIVE  NEGATIVE     Bilirubin Urine NEGATIVE  NEGATIVE     Ketones, ur 15 (*) NEGATIVE (mg/dL)    Protein, ur NEGATIVE  NEGATIVE (mg/dL)    Urobilinogen, UA 0.2  0.0 - 1.0 (mg/dL)    Nitrite NEGATIVE  NEGATIVE     Leukocytes, UA NEGATIVE  NEGATIVE  MICROSCOPIC NOT DONE ON URINES WITH NEGATIVE PROTEIN, BLOOD, LEUKOCYTES, NITRITE, OR GLUCOSE <1000 mg/dL.    Ct Abdomen Pelvis W Contrast  11/06/2011  *RADIOLOGY REPORT*  Clinical Data: History of abdominal aortic aneurysm.  Left lower quadrant pain.  Vomiting.  CT ABDOMEN AND PELVIS WITH CONTRAST  Technique:  Multidetector CT imaging of the abdomen and pelvis was performed following the  standard protocol during bolus administration of intravenous contrast.  Contrast: 80mL OMNIPAQUE IOHEXOL 300 MG/ML IJ SOLN  Comparison: 09/14/2010  Findings: There is cardiomegaly.  Pacer is in place.  Small hiatal hernia.  Contrast material seen within the distal esophagus which may be related to dysmotility or reflux.  Low-density lesion within the left hepatic lobe, stable, likely cyst.  Liver otherwise unremarkable.  Gallbladder, spleen, pancreas, adrenals and kidneys are unremarkable.  There is a small bowel obstruction.  Transition point is within the mid to distal ileum on image 40 within the right abdomen. The bowel loops proximal to this transition appears  thick-walled with edema in the feeding mesentery.  Findings concerning for internal hernia with possible vascular compromise and bowel wall ischemia.  There is free fluid in the abdomen and pelvis.  Distal small bowel is decompressed.  Appendix is visualized and is normal.  There are is descending colonic and sigmoid diverticulosis.  No active diverticulitis.  Large abdominal aortic aneurysm, measuring maximally 5.7 cm compared 5.2 cm previously.  Uterus, adnexa urinary bladder grossly unremarkable.  Left inguinal hernia present containing fluid.  IMPRESSION: Small bowel obstruction.  I am concerned this may be related to an internal hernia within the right abdomen.  The small bowel loops which are lateral to the ascending colon are thick-walled with stranding in the feeding mesentery.  Findings are concerning for vascular compromise to these bowel loops.  There is a small amount of free fluid in the abdomen and pelvis.  Left colonic diverticulosis.  5.7 cm abdominal aortic aneurysm, slightly larger than prior study.  Cardiomegaly.  These results were called by telephone on 11/06/2011  at  6:20 p.m. to  Dr. Brooke Dare, who verbally acknowledged these results.  Original Report Authenticated By: Cyndie Chime, M.D.    Review of Systems  Gastrointestinal: Positive for nausea, vomiting and abdominal pain (epigastric abdominal pain.). Negative for diarrhea and constipation.  Genitourinary: Negative.   Musculoskeletal: Negative.   Neurological: Positive for weakness.  Endo/Heme/Allergies: Negative.    Blood pressure 163/81, pulse 107, temperature 97 F (36.1 C), temperature source Oral, resp. rate 25, SpO2 98.00%. Physical Exam  Constitutional: She is oriented to person, place, and time. She appears well-developed and well-nourished.  HENT:  Head: Normocephalic and atraumatic.  Eyes: Conjunctivae and EOM are normal. Pupils are equal, round, and reactive to light.  Neck: Normal range of motion. Neck supple.    Cardiovascular: Normal rate and regular rhythm.   Respiratory: Effort normal and breath sounds normal.  GI: Soft. She exhibits distension (moderated distension) and abdominal bruit. She exhibits no shifting dullness, no pulsatile liver, no fluid wave, no ascites and no mass. There is hepatosplenomegaly. There is tenderness (mild tenderness, tinkling bowel sounds.) in the epigastric area. There is no rebound and no CVA tenderness. A hernia is present. Hernia confirmed positive in the left inguinal area (partially incarcerated, reduced in the ED.).  Musculoskeletal: Normal range of motion.  Neurological: She is alert and oriented to person, place, and time. She has normal reflexes.  Skin: Skin is warm and dry.  Psychiatric: She has a normal mood and affect. Her behavior is normal. Judgment and thought content normal.    Assessment/Plan: Based on the CT scan of the abdomen and pelvis, the patient has an internal hernia with possible vascular compromise.  After looking at the CT myself it seemed as though the obstruction was coming from her left inguinal hernia which was incarcerated.  Subsequently, I re-examined the patient and  reduced the hernia with minimal difficulty.  I also passed an NGT and placed it to LIS.Marland Kitchen  Both these maneuvers should help her resolve the acute obstruction, but now that the [facility] is becoming more symptomatic, it may require operative intervention.  Dr. Claud Kelp has been following this patient as an outpatient for her [facility].  The plan it to decompress her with NGT, IV hydrate, abserve and see if she will improve without surgical intervention.  She has significant medical problems including CHF she put her at moderate to high surgical risk.  We will follow her on the medicine service.  [name],[name] O 11/06/2011, 7:30 PM

## 2011-11-06 NOTE — ED Notes (Signed)
Attempted NG tube to left nare without success.

## 2011-11-06 NOTE — ED Provider Notes (Signed)
History     CSN: [id number]  Arrival date & time 11/06/11  1519   First MD Initiated Contact with Patient 11/06/11 1527      Chief Complaint  Patient presents with  . Abdominal Pain    (Consider location/radiation/quality/duration/timing/severity/associated sxs/prior treatment) Patient is a 78 y.o. female presenting with abdominal pain. The history is provided by the patient and a relative. No language interpreter was used.  Abdominal Pain The primary symptoms of the illness include abdominal pain, fatigue, nausea and vomiting. The primary symptoms of the illness do not include fever, shortness of breath, diarrhea or dysuria. The current episode started 3 to 5 hours ago. The onset of the illness was gradual. The problem has been gradually worsening.  The abdominal pain began 3 to 5 hours ago. The pain came on gradually. The abdominal pain has been gradually worsening since its onset. The abdominal pain is located in the periumbilical region and LLQ. The abdominal pain does not radiate. The abdominal pain is relieved by nothing. The abdominal pain is exacerbated by vomiting, movement and certain positions.  Nausea began today. The nausea is exacerbated by food.  The vomiting began today. Vomiting occurs 2 to 5 times per day. The emesis contains stomach contents.  The patient states that she believes she is currently not pregnant. The patient has not had a change in bowel habit. Symptoms associated with the illness do not include chills, anorexia, diaphoresis, constipation, urgency, frequency or back pain.    Past Medical History  Diagnosis Date  . Aortic valve regurgitation   . Congenital heart disease   . Left bundle branch block   . Hyperlipidemia   . Mitral valve disorder   . Pacemaker     For complete heart block, s/p generator change 09/2010   . Dyspnea   . Anxiety   . Depression   . Aneurysm of abdominal aorta     4.4 cm in 09/2010  . Closed intertrochanteric fracture of  right hip 07/13/2011    Past Surgical History  Procedure Date  . Knee arthroscopy   . Pacemaker insertion   . Insert / replace / remove pacemaker   . Femur im nail 07/14/2011    Procedure: INTRAMEDULLARY (IM) NAIL FEMORAL;  Surgeon: Eulas Post;  Location: [facility] OR;  Service: Orthopedics;  Laterality: Right;  Synthes TFN, fracture table, big C arm, available after 4p  . Left ingunial hernia     History reviewed. No pertinent family history.  History  Substance Use Topics  . Smoking status: Never Smoker   . Smokeless tobacco: Not on file  . Alcohol Use: Yes    OB History    Grav Para Term Preterm Abortions TAB SAB Ect Mult Living                  Review of Systems  Constitutional: Positive for fatigue. Negative for fever, chills, diaphoresis, activity change and appetite change.  HENT: Negative for congestion, sore throat, rhinorrhea, neck pain and neck stiffness.   Respiratory: Negative for cough and shortness of breath.   Cardiovascular: Negative for chest pain and palpitations.  Gastrointestinal: Positive for nausea, vomiting and abdominal pain. Negative for diarrhea, constipation and anorexia.  Genitourinary: Negative for dysuria, urgency, frequency and flank pain.  Musculoskeletal: Negative for myalgias, back pain and arthralgias.  Neurological: Negative for dizziness, weakness, light-headedness, numbness and headaches.  All other systems reviewed and are negative.    Allergies  Ace inhibitors and Atorvastatin  Home Medications  Current Outpatient Rx  Name Route Sig Dispense Refill  . ACETAMINOPHEN 500 MG PO TABS Oral Take 500 mg by mouth as needed. For arthritis pain    . ASPIRIN 81 MG PO TABS Oral Take 81 mg by mouth daily.      Marland Kitchen CARVEDILOL 6.25 MG PO TABS Oral Take 6.25 mg by mouth 2 (two) times daily.      Marland Kitchen VITAMIN D 1000 UNITS PO TABS Oral Take 1,000 Units by mouth daily.      . FUROSEMIDE 20 MG PO TABS Oral Take 20 mg by mouth 2 (two) times daily.       Marland Kitchen GLUCOSAMINE 500 MG PO CAPS Oral Take 1,500-3,000 mg by mouth daily. 3000 mg daily?    Marland Kitchen BACID PO TABS Oral Take 1 tablet by mouth daily.    Marland Kitchen LOSARTAN POTASSIUM 50 MG PO TABS Oral Take 50 mg by mouth daily.     . LUTEIN 20 MG PO CAPS Oral Take 20 mg by mouth daily.     Marland Kitchen MECLIZINE HCL 25 MG PO TABS Oral Take 25 mg by mouth 3 (three) times daily as needed. Nausea/dizziness     . MULTIVITAL PO TABS Oral Take 1 tablet by mouth daily.     . OXYBUTYNIN CHLORIDE ER 10 MG PO TB24 Oral Take 10 mg by mouth daily.      Marland Kitchen SIMVASTATIN 20 MG PO TABS Oral Take 20 mg by mouth daily.       BP 163/81  Pulse 107  Temp(Src) 97 F (36.1 C) (Oral)  Resp 25  SpO2 98%  Physical Exam  Nursing note and vitals reviewed. Constitutional: She is oriented to person, place, and time. She appears well-developed and well-nourished.       Uncomfortable in appearnace  HENT:  Head: Normocephalic and atraumatic.  Mouth/Throat: Oropharynx is clear and moist.  Eyes: Conjunctivae and EOM are normal. Pupils are equal, round, and reactive to light.  Neck: Normal range of motion. Neck supple.  Cardiovascular: Normal rate, regular rhythm and intact distal pulses.  Exam reveals no gallop and no friction rub.   Murmur heard. Pulmonary/Chest: Effort normal and breath sounds normal. She has no wheezes. She exhibits no tenderness.  Abdominal: Soft. Bowel sounds are normal. She exhibits no mass. There is tenderness (L periumbilical area and LLQ). There is no rebound and no guarding.  Musculoskeletal: Normal range of motion. She exhibits no edema and no tenderness.  Neurological: She is alert and oriented to person, place, and time. No cranial nerve deficit.  Skin: Skin is warm and dry. No rash noted.    ED Course  Procedures (including critical care time)  CRITICAL CARE Performed by: Dayton Bailiff   Total critical care time: 30 min  Critical care time was exclusive of separately billable procedures and treating other  patients.  Critical care was necessary to treat or prevent imminent or life-threatening deterioration.  Critical care was time spent personally by me on the following activities: development of treatment plan with patient and/or surrogate as well as nursing, discussions with consultants, evaluation of patient's response to treatment, examination of patient, obtaining history from patient or surrogate, ordering and performing treatments and interventions, ordering and review of laboratory studies, ordering and review of radiographic studies, pulse oximetry and re-evaluation of patient's condition.   Date: 11/06/2011  Rate: 95  Rhythm: normal sinus rhythm  QRS Axis: indeterminate  Intervals: normal  ST/T Wave abnormalities: normal  Conduction Disutrbances:LBBB which is stable. pacer  Narrative Interpretation:   Old EKG Reviewed: unchanged  Labs Reviewed  CBC - Abnormal; Notable for the following:    WBC 12.4 (*)    All other components within normal limits  DIFFERENTIAL - Abnormal; Notable for the following:    Neutrophils Relative 86 (*)    Neutro Abs 10.6 (*)    Lymphocytes Relative 8 (*)    All other components within normal limits  COMPREHENSIVE METABOLIC PANEL - Abnormal; Notable for the following:    Glucose, Bld 181 (*)    BUN 30 (*)    GFR calc non Af Amer 73 (*)    GFR calc Af Amer 84 (*)    All other components within normal limits  URINALYSIS, ROUTINE W REFLEX MICROSCOPIC - Abnormal; Notable for the following:    APPearance CLOUDY (*)    Ketones, ur 15 (*)    All other components within normal limits  LACTIC ACID, PLASMA - Abnormal; Notable for the following:    Lactic Acid, Venous 2.8 (*)    All other components within normal limits  LIPASE, BLOOD  APTT  PROTIME-INR  POCT I-STAT TROPONIN I   Ct Abdomen Pelvis W Contrast  11/06/2011  *RADIOLOGY REPORT*  Clinical Data: History of abdominal aortic aneurysm.  Left lower quadrant pain.  Vomiting.  CT ABDOMEN AND  PELVIS WITH CONTRAST  Technique:  Multidetector CT imaging of the abdomen and pelvis was performed following the standard protocol during bolus administration of intravenous contrast.  Contrast: 80mL OMNIPAQUE IOHEXOL 300 MG/ML IJ SOLN  Comparison: 09/14/2010  Findings: There is cardiomegaly.  Pacer is in place.  Small hiatal hernia.  Contrast material seen within the distal esophagus which may be related to dysmotility or reflux.  Low-density lesion within the left hepatic lobe, stable, likely cyst.  Liver otherwise unremarkable.  Gallbladder, spleen, pancreas, adrenals and kidneys are unremarkable.  There is a small bowel obstruction.  Transition point is within the mid to distal ileum on image 40 within the right abdomen. The bowel loops proximal to this transition appears thick-walled with edema in the feeding mesentery.  Findings concerning for internal hernia with possible vascular compromise and bowel wall ischemia.  There is free fluid in the abdomen and pelvis.  Distal small bowel is decompressed.  Appendix is visualized and is normal.  There are is descending colonic and sigmoid diverticulosis.  No active diverticulitis.  Large abdominal aortic aneurysm, measuring maximally 5.7 cm compared 5.2 cm previously.  Uterus, adnexa urinary bladder grossly unremarkable.  Left inguinal hernia present containing fluid.  IMPRESSION: Small bowel obstruction.  I am concerned this may be related to an internal hernia within the right abdomen.  The small bowel loops which are lateral to the ascending colon are thick-walled with stranding in the feeding mesentery.  Findings are concerning for vascular compromise to these bowel loops.  There is a small amount of free fluid in the abdomen and pelvis.  Left colonic diverticulosis.  5.7 cm abdominal aortic aneurysm, slightly larger than prior study.  Cardiomegaly.  These results were called by telephone on 11/06/2011  at  6:20 p.m. to  Dr. Brooke Dare, who verbally acknowledged these  results.  Original Report Authenticated By: Cyndie Chime, M.D.     1. SBO (small bowel obstruction)       MDM  Patient with small bowel obstruction with concerning findings on CT scan for internal hernia with possible early vascular compromise. The patient has no evidence of peritonitis on examination. An NG tube  will be placed. Because of these findings I discussed the case with Dr. Lindie Spruce from general surgery who evaluated the patient. Given the multiple comorbidities she'll be admitted to the general medicine service. There is no indication for immediate surgery at this time. No additional interventions are necessary at this point. She was given antiemetics and pain medication. IV fluids as her lactate was slightly elevated.        Dayton Bailiff, MD 11/06/11 [phone]

## 2011-11-06 NOTE — ED Notes (Signed)
Patient started having LLQ pain today around noon. Has vomited multiple time per EMS and family.  Zofran 4 mg given en route by EMS with no vomiting at present.  Patient has a history of AAA that  Is being monitored by family physician.

## 2011-11-07 ENCOUNTER — Inpatient Hospital Stay (HOSPITAL_COMMUNITY): Payer: Medicare Other

## 2011-11-07 ENCOUNTER — Encounter (HOSPITAL_COMMUNITY): Payer: Self-pay | Admitting: Cardiology

## 2011-11-07 LAB — CBC
HCT: 42.1 % (ref 36.0–46.0)
MCHC: 32.8 g/dL (ref 30.0–36.0)
Platelets: 280 10*3/uL (ref 150–400)
RDW: 14.8 % (ref 11.5–15.5)
WBC: 15 10*3/uL — ABNORMAL HIGH (ref 4.0–10.5)

## 2011-11-07 LAB — BASIC METABOLIC PANEL
Calcium: 9.2 mg/dL (ref 8.4–10.5)
Chloride: 106 mEq/L (ref 96–112)
Creatinine, Ser: 0.78 mg/dL (ref 0.50–1.10)
GFR calc Af Amer: 83 mL/min — ABNORMAL LOW (ref >90)
GFR calc non Af Amer: 72 mL/min — ABNORMAL LOW (ref >90)

## 2011-11-07 LAB — LACTIC ACID, PLASMA: Lactic Acid, Venous: 0.9 mmol/L (ref 0.5–2.2)

## 2011-11-07 LAB — GLUCOSE, CAPILLARY
Glucose-Capillary: 229 mg/dL — ABNORMAL HIGH (ref 70–99)
Glucose-Capillary: 182 mg/dL — ABNORMAL HIGH (ref 70–99)

## 2011-11-07 MED ORDER — METOPROLOL TARTRATE 1 MG/ML IV SOLN
2.5000 mg | Freq: Four times a day (QID) | INTRAVENOUS | Status: DC
  Administered 2011-11-07 – 2011-11-10 (×11): 2.5 mg via INTRAVENOUS
  Filled 2011-11-07 (×15): qty 5

## 2011-11-07 NOTE — Progress Notes (Signed)
NG tube was not suctioning. Nurse call Rapid Response Nurse Selena Batten) to recruit her assistance with NG tube. Kim educated the nurse and family that sometimes the NG tube attaches to the wall of the stomach and does not suction properly. Kim instilled air into the tube to dislodge it from the stomach and resume suctioning. Harmon Pier

## 2011-11-07 NOTE — Consult Note (Signed)
Admit date: 11/06/2011 Name: [name] 78 y.o.  female DOB:  June 23, 1920 MRN:  [id number]  Today's date:  11/07/2011  Referring Physician:    Dr. Donette Larry  Primary Physician:    Dr. Donette Larry  Reason for Consultation:   Preoperative cardiac evaluation  IMPRESSIONS: 1. Chronic systolic congestive heart failure with last ejection fraction of 35% by echocardiogram in May of 2012 with improvement following biventricular pacemaking currently stable 2. Chronic biventricular pacemaker 3. Abdominal aneurysm 4. Hypertensive heart disease 5. Hyperlipidemia 6. Chronic hernia with acute small bowel obstruction  RECOMMENDATION: From a cardiovascular viewpoint, the patient is currently stable to undergo abdominal surgery if necessary. Her risk of cardiovascular complications is high due to her age, current condition, and depressed ejection fraction. However she is not currently decompensated and has no evidence of active cardiac ischemia.  I will obtain a followup echocardiogram. We might consider intravenous beta-blockade.  HISTORY: This 78 year old female has a history of left bundle branch block and complete heart block in 2006. She had a permanent pacemaker implanted on I have seen her for several years. She had a normal ejection fraction when I initially saw her but developed congestive heart failure with an ejection fraction of 15-20%. She needed a battery change and she was upgraded to a biventricular pacemaker the time of the battery change and since then has had a rise in her ejection fraction to 35% as noted on echocardiogram in May of 2012. She has chronic anxiety and depression and has been elderly husband who has dementia and parkinsonism and is quite needy. She has been able to be about her normal activities but fell and broke her hip in November of this year and underwent a successful hip replacement. She has been back at home a little under 1 months. She denies recent congestive heart  failure exacerbation and denies PND orthopnea or edema. She does not have any angina. Her mitral regurgitation improved with treatment of her heart failure.  She has had a previous large hernia that has not been that symptomatic until recently when she has had increased abdominal pain. Was felt that she would be very high risk for correction of the hernia lasted became more problematic. She presented with a small bowel obstruction and currently has had evidence of lactic acidosis, leukocytosis and continued poor bowel sounds. I was asked to consult on her for preoperative assessment.  Past Medical History  Diagnosis Date  . Aortic valve regurgitation   . Left bundle branch block   . Hyperlipidemia   . Mitral valve disorder   . Pacemaker     For complete heart block, s/p generator change 09/2010   . Dyspnea   . Anxiety   . Depression   . Aneurysm of abdominal aorta     4.4 cm in 09/2010  . Closed intertrochanteric fracture of right hip 07/13/2011      Past Surgical History  Procedure Date  . Replacement total knee 2008    left by Dr. Marciano Sequin  . Pacemaker insertion 2006    complete heart block  . Insert / replace / remove pacemaker 2012    upgrade to biventricular  . Femur im nail 07/14/2011    Procedure: INTRAMEDULLARY (IM) NAIL FEMORAL;  Surgeon: Eulas Post;  Location: [facility] OR;  Service: Orthopedics;  Laterality: Right;  Synthes TFN, fracture table, big C arm, available after 4p  . Left ingunial hernia    Allergies:  is allergic to ace inhibitors and atorvastatin.   Medications:  Prior to Admission medications   Medication Sig Start Date End Date Taking? Authorizing Provider  acetaminophen (TYLENOL) 500 MG tablet Take 500 mg by mouth as needed. For arthritis pain   Yes Historical Provider, MD  aspirin 81 MG tablet Take 81 mg by mouth daily.     Yes Historical Provider, MD  carvedilol (COREG) 6.25 MG tablet Take 6.25 mg by mouth 2 (two) times daily.     Yes Historical Provider,  MD  cholecalciferol (VITAMIN D) 1000 UNITS tablet Take 1,000 Units by mouth daily.     Yes Historical Provider, MD  furosemide (LASIX) 20 MG tablet Take 20 mg by mouth 2 (two) times daily.     Yes Historical Provider, MD  Glucosamine 500 MG CAPS Take 1,500-3,000 mg by mouth daily. 3000 mg daily?   Yes Historical Provider, MD  lactobacillus acidophilus (BACID) TABS Take 1 tablet by mouth daily.   Yes Historical Provider, MD  losartan (COZAAR) 50 MG tablet Take 50 mg by mouth daily.    Yes Historical Provider, MD  Lutein 20 MG CAPS Take 20 mg by mouth daily.    Yes Historical Provider, MD  meclizine (ANTIVERT) 25 MG tablet Take 25 mg by mouth 3 (three) times daily as needed. Nausea/dizziness    Yes Historical Provider, MD  Multiple Vitamins-Minerals (MULTIVITAL) tablet Take 1 tablet by mouth daily.    Yes Historical Provider, MD  oxybutynin (DITROPAN-XL) 10 MG 24 hr tablet Take 10 mg by mouth daily.     Yes Historical Provider, MD  simvastatin (ZOCOR) 20 MG tablet Take 20 mg by mouth daily.    Yes Historical Provider, MD    Social History:   reports that she has never smoked. She does not have any smokeless tobacco history on file. She reports that she drinks alcohol. She reports that she does not use illicit drugs.   History   Social History Narrative   Regular exercise- yes. Retired and married.   Husband has significant disabilities and needs    Review of Systems: Mild stress incontinence. She also has severe difficulty with anxiety and depression and is quite stressed over her husband. She also complains of intermittent abdominal pain and has a large hernia that has to be reduced at times. Other than as noted above the remainder of the review of systems is unremarkable.  Physical Exam: Blood pressure 137/60, pulse 94, temperature 98.8 F (37.1 C), temperature source Oral, resp. rate 18, height 4\' 9"  (1.448 m), weight 54.4 kg (119 lb 14.9 oz), SpO2 93.00%.    General appearance: She is  an elderly female with red dyed hair who is lying in bed on her side with an NG tube in place and appears younger than stated age Head: Normocephalic, without obvious abnormality, atraumatic Eyes: negative Neck: no carotid bruit, no JVD and supple, symmetrical, trachea midline Lungs: clear to auscultation bilaterally Heart: regular rate and rhythm, S1, S2 normal and no S3 or S4 Abdomen: Quiet bowel sounds, moderately tender to palpation Extremities: Trace edema, legs are somewhat large, pulses intact Pulses: Femoral pulses are 2+ and present, pedal pulses are 1+ Neurologic: Grossly normal  Labs: Results for orders placed during the hospital encounter of 11/06/11 (from the past 24 hour(s))  URINALYSIS, ROUTINE W REFLEX MICROSCOPIC     Status: Abnormal   Collection Time   11/06/11  6:22 PM      Component Value Range   Color, Urine YELLOW  YELLOW    APPearance CLOUDY (*) CLEAR  Specific Gravity, Urine 1.022  1.005 - 1.030    pH 8.0  5.0 - 8.0    Glucose, UA NEGATIVE  NEGATIVE (mg/dL)   Hgb urine dipstick NEGATIVE  NEGATIVE    Bilirubin Urine NEGATIVE  NEGATIVE    Ketones, ur 15 (*) NEGATIVE (mg/dL)   Protein, ur NEGATIVE  NEGATIVE (mg/dL)   Urobilinogen, UA 0.2  0.0 - 1.0 (mg/dL)   Nitrite NEGATIVE  NEGATIVE    Leukocytes, UA NEGATIVE  NEGATIVE   CBC     Status: Abnormal   Collection Time   11/06/11  9:50 PM      Component Value Range   WBC 14.9 (*) 4.0 - 10.5 (K/uL)   RBC 4.27  3.87 - 5.11 (MIL/uL)   Hemoglobin 14.1  12.0 - 15.0 (g/dL)   HCT 81.1  91.4 - 78.2 (%)   MCV 97.2  78.0 - 100.0 (fL)   MCH 33.0  26.0 - 34.0 (pg)   MCHC 34.0  30.0 - 36.0 (g/dL)   RDW 95.6  21.3 - 08.6 (%)   Platelets 262  150 - 400 (K/uL)  GLUCOSE, CAPILLARY     Status: Abnormal   Collection Time   11/07/11  5:16 AM      Component Value Range   Glucose-Capillary 229 (*) 70 - 99 (mg/dL)  BASIC METABOLIC PANEL     Status: Abnormal   Collection Time   11/07/11  6:00 AM      Component Value Range    Sodium 139  135 - 145 (mEq/L)   Potassium 4.4  3.5 - 5.1 (mEq/L)   Chloride 106  96 - 112 (mEq/L)   CO2 23  19 - 32 (mEq/L)   Glucose, Bld 214 (*) 70 - 99 (mg/dL)   BUN 31 (*) 6 - 23 (mg/dL)   Creatinine, Ser 5.78  0.50 - 1.10 (mg/dL)   Calcium 9.2  8.4 - 46.9 (mg/dL)   GFR calc non Af Amer 72 (*) >90 (mL/min)   GFR calc Af Amer 83 (*) >90 (mL/min)  CBC     Status: Abnormal   Collection Time   11/07/11  6:00 AM      Component Value Range   WBC 15.0 (*) 4.0 - 10.5 (K/uL)   RBC 4.30  3.87 - 5.11 (MIL/uL)   Hemoglobin 13.8  12.0 - 15.0 (g/dL)   HCT 62.9  52.8 - 41.3 (%)   MCV 97.9  78.0 - 100.0 (fL)   MCH 32.1  26.0 - 34.0 (pg)   MCHC 32.8  30.0 - 36.0 (g/dL)   RDW 24.4  01.0 - 27.2 (%)   Platelets 280  150 - 400 (K/uL)  GLUCOSE, CAPILLARY     Status: Abnormal   Collection Time   11/07/11 11:23 AM      Component Value Range   Glucose-Capillary 182 (*) 70 - 99 (mg/dL)      Radiology: No chest x-ray available  EKG: Sinus rhythm with paced ventricular beats  Signed:  W. Ashley Royalty MD [facility]   Cardiology Consultant  11/07/2011, 5:03 PM

## 2011-11-07 NOTE — Progress Notes (Signed)
Called Dr. Venita Sheffield office to check to see if coming by to speak with pt.  Dr. Particia Lather recommended paging the surgeon Dr. Luisa Hart to see pt.  Paged Dr. Luisa Hart. Me

## 2011-11-07 NOTE — Progress Notes (Signed)
Patient ID: [name], female   DOB: Jun 28, 1920, 78 y.o.   MRN: [id number] Subjective: Pt c/o dry mouth NG with bile drainage- some problem with drainage reported last night Now function ok Some abdominal pain, nausea   Objective: Vital signs in last 24 hours: Temp:  [97 F (36.1 C)-98.6 F (37 C)] 98.6 F (37 C) (03/15 0418) Pulse Rate:  [96-118] 99  (03/15 0418) Resp:  [15-33] 20  (03/15 0418) BP: (122-186)/(61-109) 122/61 mmHg (03/15 0418) SpO2:  [93 %-100 %] 94 % (03/15 0418) Weight:  [51.8 kg (114 lb 3.2 oz)-54.4 kg (119 lb 14.9 oz)] 54.4 kg (119 lb 14.9 oz) (03/15 0418)   Intake/Output from previous day:      General appearance: alert Resp: clear to auscultation bilaterally Cardio: regular rate and rhythm, S1, S2 normal, no murmur, click, rub or gallop GI: abnormal findings:  distended, hypoactive bowel sounds and left hernia reducible;    Lab Results:  Basename 11/07/11 0600 11/06/11 2150  WBC 15.0* 14.9*  HGB 13.8 14.1  HCT 42.1 41.5  PLT 280 262   BMET  Basename 11/07/11 0600 11/06/11 1559  NA 139 140  K 4.4 3.5  CL 106 99  CO2 23 24  GLUCOSE 214* 181*  BUN 31* 30*  CREATININE 0.78 0.76  CALCIUM 9.2 10.5   Lab Results  Component Value Date   ALT 11 11/06/2011   AST 25 11/06/2011   ALKPHOS 83 11/06/2011   BILITOT 0.5 11/06/2011    Assessment/Plan:  Principal Problem:  *Abdominal pain SBO.  LEFT INGUINAL HERNIA ; POSSIBLE PARTIAL INCARCERATION NG TUBE SURG FOLLOWING SURG MAY BE NEEDED IF NO RESOLUTION OF SBO KUB TODAY DR TILLEY IS HER CARDIOLOGY; WILL INFORM SHE IS MEDICALLY STABLE TO GO TO SURGERY IF NEEDED- NO CP. NO SOB PACED RHYTHM Active Problems:  Chronic systolic heart failure STABLE  Hyperlipidemia  Hypertension BP OK  Lactic acid increased  Nausea & vomiting  Mild dehydration   [name] 11/07/2011, 7:47 AM

## 2011-11-07 NOTE — Progress Notes (Signed)
INITIAL ADULT NUTRITION ASSESSMENT Date: 11/07/2011   Time: 11:26 AM Reason for Assessment: Nutrition Risk, unt weight loss  ASSESSMENT: Female 78 y.o.  Dx: Abdominal pain  Hx:  Past Medical History  Diagnosis Date  . Aortic valve regurgitation   . Congenital heart disease   . Left bundle branch block   . Hyperlipidemia   . Mitral valve disorder   . Pacemaker     For complete heart block, s/p generator change 09/2010   . Dyspnea   . Anxiety   . Depression   . Aneurysm of abdominal aorta     4.4 cm in 09/2010  . Closed intertrochanteric fracture of right hip 07/13/2011    Related Meds:     . sodium chloride   Intravenous STAT  . heparin  5,000 Units Subcutaneous Q8H  . iohexol  20 mL Oral Q1 Hr x 2  . metoCLOPramide (REGLAN) injection  10 mg Intravenous Once  .  morphine injection  2 mg Intravenous Once  . ondansetron  4 mg Intravenous Once  . pantoprazole (PROTONIX) IV  40 mg Intravenous QHS  . sodium chloride  1,000 mL Intravenous Once     Ht: 4\' 9"  (144.8 cm)  Wt: 119 lb 14.9 oz (54.4 kg)  Ideal Wt: 43.2 kg % Ideal Wt: 126%  Usual Wt: 110 lbs ( 50 kg) % Usual Wt: 108%  Body mass index is 25.95 kg/(m^2). over weight  Food/Nutrition Related Hx: Per patients home health RN, patient lost weight after a fracture since then as gained weight back. Patient is very health conscious, eats well and likes to maintain weight around 110 lbs.   Labs:  CMP     Component Value Date/Time   NA 139 11/07/2011 0600   K 4.4 11/07/2011 0600   CL 106 11/07/2011 0600   CO2 23 11/07/2011 0600   GLUCOSE 214* 11/07/2011 0600   BUN 31* 11/07/2011 0600   CREATININE 0.78 11/07/2011 0600   CALCIUM 9.2 11/07/2011 0600   PROT 6.8 11/06/2011 1559   ALBUMIN 3.7 11/06/2011 1559   AST 25 11/06/2011 1559   ALT 11 11/06/2011 1559   ALKPHOS 83 11/06/2011 1559   BILITOT 0.5 11/06/2011 1559   GFRNONAA 72* 11/07/2011 0600   GFRAA 83* 11/07/2011 0600    Intake/Output Summary (Last 24 hours) at  11/07/11 1128 Last data filed at 11/07/11 0900  Gross per 24 hour  Intake    180 ml  Output     75 ml  Net    105 ml     Diet Order: NPO NPO  Supplements/Tube Feeding: none  IVF:    sodium chloride Last Rate: 75 mL/hr at 11/07/11 0342    Estimated Nutritional Needs:   Kcal: 1200-1400  Protein: 55-65 gm Fluid: >1.5 L  Patient did have some unintentional weight loss, now back to normal weight. 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    NUTRITION DIAGNOSIS: -Inadequate oral intake (NI-2.1).  Status: Ongoing  RELATED TO: inability to eat  AS EVIDENCE BY: NPO diet  MONITORING/EVALUATION(Goals): Goal: diet will advance for PO intake within 5 days Monitor: diet advance, weight, I/O's  EDUCATION NEEDS: -No education needs identified at this time  INTERVENTION: No nutrition intervention at this time. RD will continue to follow  Dietitian [phone]  DOCUMENTATION CODES Per approved criteria  -Not Applicable    [name] 11/07/2011, 11:26 AM

## 2011-11-07 NOTE — Progress Notes (Signed)
Per nursing assessment patient's BP was 179/84. Patient has hydralazine IV 0.77ml ordered for SBP:185. Because patient did not meet the parameters, nurse paged Dr. Elisabeth Pigeon. Dr. Elisabeth Pigeon instructed the nurse to give the hydralazine as ordered. Nurse performed as was instructed. Harmon Pier

## 2011-11-07 NOTE — Progress Notes (Signed)
Subjective: Pt feels poor abdominal pain noted Objective: Vital signs in last 24 hours: Temp:  [97 F (36.1 C)-98.6 F (37 C)] 98.6 F (37 C) (03/15 0418) Pulse Rate:  [96-118] 99  (03/15 0418) Resp:  [15-33] 20  (03/15 0418) BP: (122-186)/(61-109) 122/61 mmHg (03/15 0418) SpO2:  [93 %-100 %] 94 % (03/15 0418) Weight:  [114 lb 3.2 oz (51.8 kg)-119 lb 14.9 oz (54.4 kg)] 119 lb 14.9 oz (54.4 kg) (03/15 0418) Last BM Date: 11/06/11  Intake/Output from previous day:   Intake/Output this shift: Total I/O In: 180 [NG/GT:180] Out: 75 [Urine:75]  GI: distnded and mildly tender.  NGT output looks feculent Reduced left inguinal hernia soft nontender  Lab Results:   Basename 11/07/11 0600 11/06/11 2150  WBC 15.0* 14.9*  HGB 13.8 14.1  HCT 42.1 41.5  PLT 280 262   BMET  Basename 11/07/11 0600 11/06/11 1559  NA 139 140  K 4.4 3.5  CL 106 99  CO2 23 24  GLUCOSE 214* 181*  BUN 31* 30*  CREATININE 0.78 0.76  CALCIUM 9.2 10.5   PT/INR  Basename 11/06/11 1559  LABPROT 13.3  INR 0.99   ABG No results found for this basename: PHART:2,PCO2:2,PO2:2,HCO3:2 in the last 72 hours  Studies/Results: Dg Abd 1 View  11/06/2011  *RADIOLOGY REPORT*  Clinical Data: Abdominal pain and nausea.  A small bowel obstruction.  Nasogastric tube placement.  ABDOMEN - 1 VIEW  Comparison: 09/14/2010  Findings: A nasogastric tube is seen which is looped in the stomach, with the tip extending back up into the distal thoracic esophagus.  Mildly dilated small bowel loops are seen.  Contrast is seen within the urinary tract from recent CT.  IMPRESSION: Nasogastric tube is looped in the stomach, with the tip extending back up into the distal thoracic esophagus.  Original Report Authenticated By: Danae Orleans, M.D.   Ct Abdomen Pelvis W Contrast  11/06/2011  *RADIOLOGY REPORT*  Clinical Data: History of abdominal aortic aneurysm.  Left lower quadrant pain.  Vomiting.  CT ABDOMEN AND PELVIS WITH CONTRAST   Technique:  Multidetector CT imaging of the abdomen and pelvis was performed following the standard protocol during bolus administration of intravenous contrast.  Contrast: 80mL OMNIPAQUE IOHEXOL 300 MG/ML IJ SOLN  Comparison: 09/14/2010  Findings: There is cardiomegaly.  Pacer is in place.  Small hiatal hernia.  Contrast material seen within the distal esophagus which may be related to dysmotility or reflux.  Low-density lesion within the left hepatic lobe, stable, likely cyst.  Liver otherwise unremarkable.  Gallbladder, spleen, pancreas, adrenals and kidneys are unremarkable.  There is a small bowel obstruction.  Transition point is within the mid to distal ileum on image 40 within the right abdomen. The bowel loops proximal to this transition appears thick-walled with edema in the feeding mesentery.  Findings concerning for internal hernia with possible vascular compromise and bowel wall ischemia.  There is free fluid in the abdomen and pelvis.  Distal small bowel is decompressed.  Appendix is visualized and is normal.  There are is descending colonic and sigmoid diverticulosis.  No active diverticulitis.  Large abdominal aortic aneurysm, measuring maximally 5.7 cm compared 5.2 cm previously.  Uterus, adnexa urinary bladder grossly unremarkable.  Left inguinal hernia present containing fluid.  IMPRESSION: Small bowel obstruction.  I am concerned this may be related to an internal hernia within the right abdomen.  The small bowel loops which are lateral to the ascending colon are thick-walled with stranding in the  feeding mesentery.  Findings are concerning for vascular compromise to these bowel loops.  There is a small amount of free fluid in the abdomen and pelvis.  Left colonic diverticulosis.  5.7 cm abdominal aortic aneurysm, slightly larger than prior study.  Cardiomegaly.  These results were called by telephone on 11/06/2011  at  6:20 p.m. to  Dr. Brooke Dare, who verbally acknowledged these results.  Original  Report Authenticated By: Cyndie Chime, M.D.    Anti-infectives: Anti-infectives    None      Assessment/Plan: LIH reduced SBO  Not sure if hernia totally explains this.  Will check  KUB this AM and if no improvement may need to consider exploratory laparotomy since there was some concern for an internal hernia.  WBC is up slightly and she feels worse.  Very high operative risk for complications and death but may be only option.  Discussed with the patient and daughter.   LOS: 1 day    Shavonda Wiedman A. 11/07/2011

## 2011-11-08 ENCOUNTER — Inpatient Hospital Stay (HOSPITAL_COMMUNITY): Payer: Medicare Other

## 2011-11-08 LAB — CBC
MCH: 32.5 pg (ref 26.0–34.0)
MCHC: 32.8 g/dL (ref 30.0–36.0)
Platelets: 197 10*3/uL (ref 150–400)
RDW: 15 % (ref 11.5–15.5)

## 2011-11-08 LAB — BASIC METABOLIC PANEL
Calcium: 8.9 mg/dL (ref 8.4–10.5)
GFR calc Af Amer: 89 mL/min — ABNORMAL LOW (ref >90)
GFR calc non Af Amer: 77 mL/min — ABNORMAL LOW (ref >90)
Potassium: 3.8 mEq/L (ref 3.5–5.1)
Sodium: 143 mEq/L (ref 135–145)

## 2011-11-08 LAB — GLUCOSE, CAPILLARY
Glucose-Capillary: 160 mg/dL — ABNORMAL HIGH (ref 70–99)
Glucose-Capillary: 158 mg/dL — ABNORMAL HIGH (ref 70–99)
Glucose-Capillary: 138 mg/dL — ABNORMAL HIGH (ref 70–99)

## 2011-11-08 NOTE — Progress Notes (Signed)
  Echocardiogram 2D Echocardiogram has been performed.  [name] 11/08/2011, 4:24 PM

## 2011-11-08 NOTE — Progress Notes (Signed)
Subjective: Denies abdominal pain but still feels poor.  Reports passing flatus this am  Objective: Vital signs in last 24 hours: Temp:  [98 F (36.7 C)-98.8 F (37.1 C)] 98 F (36.7 C) (03/16 0508) Pulse Rate:  [91-96] 96  (03/16 0508) Resp:  [18] 18  (03/16 0508) BP: (135-174)/(60-85) 150/66 mmHg (03/16 0508) SpO2:  [91 %-93 %] 92 % (03/16 0508) Weight:  [113 lb 5.1 oz (51.4 kg)] 113 lb 5.1 oz (51.4 kg) (03/16 0508) Last BM Date: 11/06/11  Intake/Output from previous day: 03/15 0701 - 03/16 0700 In: 1080 [I.V.:900; NG/GT:180] Out: 600 [Urine:325; Emesis/NG output:275] Intake/Output this shift:    Abdomen full but minimally tender.  No frank peritoneal signs. Reducible Left inguinal hernia  Lab Results:   Basename 11/07/11 0600 11/06/11 2150  WBC 15.0* 14.9*  HGB 13.8 14.1  HCT 42.1 41.5  PLT 280 262   BMET  Basename 11/07/11 0600 11/06/11 1559  NA 139 140  K 4.4 3.5  CL 106 99  CO2 23 24  GLUCOSE 214* 181*  BUN 31* 30*  CREATININE 0.78 0.76  CALCIUM 9.2 10.5   PT/INR  Basename 11/06/11 1559  LABPROT 13.3  INR 0.99   ABG No results found for this basename: PHART:2,PCO2:2,PO2:2,HCO3:2 in the last 72 hours  Studies/Results: Dg Abd 1 View  11/08/2011  *RADIOLOGY REPORT*  Clinical Data: Small bowel obstruction.  ABDOMEN - 1 VIEW  Comparison: 03/15 and 11/06/2011  Findings: NG tube is looped in the stomach.  Small bowel dilatation has increased since the prior study.  Colon is not distended. Contrast remains in the bladder from the prior CT scan.  IMPRESSION: Progressive small bowel obstruction.  Original Report Authenticated By: Gwynn Burly, M.D.   Dg Abd 1 View  11/07/2011  *RADIOLOGY REPORT*  Clinical Data: Small bowel obstruction.  Abdominal pain.  ABDOMEN - 1 VIEW  Comparison: CT scan and radiographs dated 11/06/2011  Findings: There are multiple dilated loops of small bowel, increased since the prior exam.  NG tube is coiled in the stomach.  No  large bowel dilatation.  Contrast is seen in the bladder from the prior CT scan.  IMPRESSION: Increased small bowel dilatation consistent with progressive small- bowel obstruction.  Original Report Authenticated By: Gwynn Burly, M.D.   Dg Abd 1 View  11/06/2011  *RADIOLOGY REPORT*  Clinical Data: Abdominal pain and nausea.  A small bowel obstruction.  Nasogastric tube placement.  ABDOMEN - 1 VIEW  Comparison: 09/14/2010  Findings: A nasogastric tube is seen which is looped in the stomach, with the tip extending back up into the distal thoracic esophagus.  Mildly dilated small bowel loops are seen.  Contrast is seen within the urinary tract from recent CT.  IMPRESSION: Nasogastric tube is looped in the stomach, with the tip extending back up into the distal thoracic esophagus.  Original Report Authenticated By: Danae Orleans, M.D.   Ct Abdomen Pelvis W Contrast  11/06/2011  *RADIOLOGY REPORT*  Clinical Data: History of abdominal aortic aneurysm.  Left lower quadrant pain.  Vomiting.  CT ABDOMEN AND PELVIS WITH CONTRAST  Technique:  Multidetector CT imaging of the abdomen and pelvis was performed following the standard protocol during bolus administration of intravenous contrast.  Contrast: 80mL OMNIPAQUE IOHEXOL 300 MG/ML IJ SOLN  Comparison: 09/14/2010  Findings: There is cardiomegaly.  Pacer is in place.  Small hiatal hernia.  Contrast material seen within the distal esophagus which may be related to dysmotility or reflux.  Low-density  lesion within the left hepatic lobe, stable, likely cyst.  Liver otherwise unremarkable.  Gallbladder, spleen, pancreas, adrenals and kidneys are unremarkable.  There is a small bowel obstruction.  Transition point is within the mid to distal ileum on image 40 within the right abdomen. The bowel loops proximal to this transition appears thick-walled with edema in the feeding mesentery.  Findings concerning for internal hernia with possible vascular compromise and bowel  wall ischemia.  There is free fluid in the abdomen and pelvis.  Distal small bowel is decompressed.  Appendix is visualized and is normal.  There are is descending colonic and sigmoid diverticulosis.  No active diverticulitis.  Large abdominal aortic aneurysm, measuring maximally 5.7 cm compared 5.2 cm previously.  Uterus, adnexa urinary bladder grossly unremarkable.  Left inguinal hernia present containing fluid.  IMPRESSION: Small bowel obstruction.  I am concerned this may be related to an internal hernia within the right abdomen.  The small bowel loops which are lateral to the ascending colon are thick-walled with stranding in the feeding mesentery.  Findings are concerning for vascular compromise to these bowel loops.  There is a small amount of free fluid in the abdomen and pelvis.  Left colonic diverticulosis.  5.7 cm abdominal aortic aneurysm, slightly larger than prior study.  Cardiomegaly.  These results were called by telephone on 11/06/2011  at  6:20 p.m. to  Dr. Brooke Dare, who verbally acknowledged these results.  Original Report Authenticated By: Cyndie Chime, M.D.    Anti-infectives: Anti-infectives    None      Assessment/Plan: s/p * No surgery found *    SBO Films look minimally improved.  Will continue NG but suspect she will come to exploration soon  LOS: 2 days    Kevon Tench A 11/08/2011

## 2011-11-08 NOTE — Progress Notes (Signed)
Subjective:  Still mild pain and significant NG drainage.  Objective:  Vital Signs in the last 24 hours: BP 150/66  Pulse 96  Temp(Src) 98 F (36.7 C) (Oral)  Resp 18  Ht 4\' 9"  (1.448 m)  Wt 51.4 kg (113 lb 5.1 oz)  BMI 24.52 kg/m2  SpO2 92%  Physical Exam: Elderly WF with NG in place Lungs:  Clear Cardiac:  Regular rhythm, normal S1 and S2, no S3 Abdomen:   Mild tender Extremities:  1+ edema present  Intake/Output from previous day: 03/15 0701 - 03/16 0700 In: 1080 [I.V.:900; NG/GT:180] Out: 600 [Urine:325; Emesis/NG output:275]  Lab Results: Basic Metabolic Panel:  Basename 11/07/11 0600 11/06/11 1559  NA 139 140  K 4.4 3.5  CL 106 99  CO2 23 24  GLUCOSE 214* 181*  BUN 31* 30*  CREATININE 0.78 0.76    CBC:  Basename 11/08/11 0900 11/07/11 0600 11/06/11 1559  WBC 11.6* 15.0* --  NEUTROABS -- -- 10.6*  HGB 11.3* 13.8 --  HCT 34.5* 42.1 --  MCV 99.1 97.9 --  PLT 197 280 --    Telemetry: Sinus with paced rhythm  Assessment/Plan:  1. Cardiomyopathy stable 2. SBO 3. No labs back  Rec:  Await surgical disposition.  Increase IV Lopressor in anticipation of surgery.  Check ECHO.    Darden Palmer.  MD [facility] 11/08/2011, 9:59 AM

## 2011-11-08 NOTE — Progress Notes (Signed)
Subjective: Complains of some pain in abdomen. NG is solving N/V. No dypsnea or chest pain  Objective:  Vital Signs: Filed Vitals:   11/07/11 1551 11/07/11 1700 11/07/11 2034 11/08/11 0508  BP: 137/60 157/70 135/63 150/66  Pulse:  94 91 96  Temp:   98.4 F (36.9 C) 98 F (36.7 C)  TempSrc:   Oral Oral  Resp:   18 18  Height:      Weight:    51.4 kg (113 lb 5.1 oz)  SpO2:   91% 92%     EXAM: mild distension; mild diffuse tenderness   Intake/Output Summary (Last 24 hours) at 11/08/11 1132 Last data filed at 11/08/11 1017  Gross per 24 hour  Intake    900 ml  Output    775 ml  Net    125 ml    Lab Results:  Basename 11/08/11 0900 11/07/11 0600  NA 143 139  K 3.8 4.4  CL 111 106  CO2 23 23  GLUCOSE 145* 214*  BUN 34* 31*  CREATININE 0.64 0.78  CALCIUM 8.9 9.2  MG -- --  PHOS -- --    Basename 11/06/11 1559  AST 25  ALT 11  ALKPHOS 83  BILITOT 0.5  PROT 6.8  ALBUMIN 3.7    Basename 11/06/11 1559  LIPASE 17  AMYLASE --    Basename 11/08/11 0900 11/07/11 0600 11/06/11 1559  WBC 11.6* 15.0* --  NEUTROABS -- -- 10.6*  HGB 11.3* 13.8 --  HCT 34.5* 42.1 --  MCV 99.1 97.9 --  PLT 197 280 --   No results found for this basename: CKTOTAL:3,CKMB:3,CKMBINDEX:3,TROPONINI:3 in the last 72 hours No components found with this basename: POCBNP:3 No results found for this basename: DDIMER:2 in the last 72 hours No results found for this basename: HGBA1C:2 in the last 72 hours No results found for this basename: CHOL:2,HDL:2,LDLCALC:2,TRIG:2,CHOLHDL:2,LDLDIRECT:2 in the last 72 hours No results found for this basename: TSH,T4TOTAL,FREET3,T3FREE,THYROIDAB in the last 72 hours No results found for this basename: VITAMINB12:2,FOLATE:2,FERRITIN:2,TIBC:2,IRON:2,RETICCTPCT:2 in the last 72 hours  Studies/Results: Dg Abd 1 View  11/08/2011  *RADIOLOGY REPORT*  Clinical Data: Small bowel obstruction.  ABDOMEN - 1 VIEW  Comparison: 03/15 and 11/06/2011  Findings: NG tube  is looped in the stomach.  Small bowel dilatation has increased since the prior study.  Colon is not distended. Contrast remains in the bladder from the prior CT scan.  IMPRESSION: Progressive small bowel obstruction.  Original Report Authenticated By: Gwynn Burly, M.D.   Dg Abd 1 View  11/07/2011  *RADIOLOGY REPORT*  Clinical Data: Small bowel obstruction.  Abdominal pain.  ABDOMEN - 1 VIEW  Comparison: CT scan and radiographs dated 11/06/2011  Findings: There are multiple dilated loops of small bowel, increased since the prior exam.  NG tube is coiled in the stomach.  No large bowel dilatation.  Contrast is seen in the bladder from the prior CT scan.  IMPRESSION: Increased small bowel dilatation consistent with progressive small- bowel obstruction.  Original Report Authenticated By: Gwynn Burly, M.D.   Dg Abd 1 View  11/06/2011  *RADIOLOGY REPORT*  Clinical Data: Abdominal pain and nausea.  A small bowel obstruction.  Nasogastric tube placement.  ABDOMEN - 1 VIEW  Comparison: 09/14/2010  Findings: A nasogastric tube is seen which is looped in the stomach, with the tip extending back up into the distal thoracic esophagus.  Mildly dilated small bowel loops are seen.  Contrast is seen within the urinary tract from recent  CT.  IMPRESSION: Nasogastric tube is looped in the stomach, with the tip extending back up into the distal thoracic esophagus.  Original Report Authenticated By: Danae Orleans, M.D.   Ct Abdomen Pelvis W Contrast  11/06/2011  *RADIOLOGY REPORT*  Clinical Data: History of abdominal aortic aneurysm.  Left lower quadrant pain.  Vomiting.  CT ABDOMEN AND PELVIS WITH CONTRAST  Technique:  Multidetector CT imaging of the abdomen and pelvis was performed following the standard protocol during bolus administration of intravenous contrast.  Contrast: 80mL OMNIPAQUE IOHEXOL 300 MG/ML IJ SOLN  Comparison: 09/14/2010  Findings: There is cardiomegaly.  Pacer is in place.  Small hiatal hernia.   Contrast material seen within the distal esophagus which may be related to dysmotility or reflux.  Low-density lesion within the left hepatic lobe, stable, likely cyst.  Liver otherwise unremarkable.  Gallbladder, spleen, pancreas, adrenals and kidneys are unremarkable.  There is a small bowel obstruction.  Transition point is within the mid to distal ileum on image 40 within the right abdomen. The bowel loops proximal to this transition appears thick-walled with edema in the feeding mesentery.  Findings concerning for internal hernia with possible vascular compromise and bowel wall ischemia.  There is free fluid in the abdomen and pelvis.  Distal small bowel is decompressed.  Appendix is visualized and is normal.  There are is descending colonic and sigmoid diverticulosis.  No active diverticulitis.  Large abdominal aortic aneurysm, measuring maximally 5.7 cm compared 5.2 cm previously.  Uterus, adnexa urinary bladder grossly unremarkable.  Left inguinal hernia present containing fluid.  IMPRESSION: Small bowel obstruction.  I am concerned this may be related to an internal hernia within the right abdomen.  The small bowel loops which are lateral to the ascending colon are thick-walled with stranding in the feeding mesentery.  Findings are concerning for vascular compromise to these bowel loops.  There is a small amount of free fluid in the abdomen and pelvis.  Left colonic diverticulosis.  5.7 cm abdominal aortic aneurysm, slightly larger than prior study.  Cardiomegaly.  These results were called by telephone on 11/06/2011  at  6:20 p.m. to  Dr. Brooke Dare, who verbally acknowledged these results.  Original Report Authenticated By: Cyndie Chime, M.D.   Medications: Scheduled Meds:   . heparin  5,000 Units Subcutaneous Q8H  . metoprolol  2.5 mg Intravenous Q6H  . pantoprazole (PROTONIX) IV  40 mg Intravenous QHS   Continuous Infusions:   . sodium chloride 75 mL/hr at 11/08/11 0609   PRN  Meds:.acetaminophen, acetaminophen, hydrALAZINE, morphine, ondansetron (ZOFRAN) IV, ondansetron, phenol  Assessment/Plan: Active Problems:  Small bowel obstruction - xrays a bit worse according to radiologist. CT report raises question of internal hernia and some stranding. However, WBC is better.   Chronic systolic congestive heart failure  - compenstated  Hypertension - controlled  Mild dehydration   LOS: 2 days   [name] CHARLES 11/08/2011, 11:32 AM

## 2011-11-09 ENCOUNTER — Inpatient Hospital Stay (HOSPITAL_COMMUNITY): Payer: Medicare Other

## 2011-11-09 LAB — GLUCOSE, CAPILLARY: Glucose-Capillary: 100 mg/dL — ABNORMAL HIGH (ref 70–99)

## 2011-11-09 NOTE — Progress Notes (Signed)
  Subjective: Feels better, Flatus but no BM  Objective: Vital signs in last 24 hours: Temp:  [96.6 F (35.9 C)-97.8 F (36.6 C)] 96.6 F (35.9 C) (03/17 0351) Pulse Rate:  [86-99] 86  (03/17 0351) Resp:  [17-19] 19  (03/17 0351) BP: (153-167)/(65-79) 167/79 mmHg (03/17 0351) SpO2:  [90 %-91 %] 91 % (03/17 0351) Weight:  [54.8 kg (120 lb 13 oz)] 54.8 kg (120 lb 13 oz) (03/17 0351) Last BM Date: 11/06/11  Intake/Output from previous day: 03/16 0701 - 03/17 0700 In: 500 [NG/GT:500] Out: 725 [Urine:725] Intake/Output this shift:    GI: distended but soft, NT, some BS  Lab Results:   Basename 11/08/11 0900 11/07/11 0600  WBC 11.6* 15.0*  HGB 11.3* 13.8  HCT 34.5* 42.1  PLT 197 280   BMET  Basename 11/08/11 0900 11/07/11 0600  NA 143 139  K 3.8 4.4  CL 111 106  CO2 23 23  GLUCOSE 145* 214*  BUN 34* 31*  CREATININE 0.64 0.78  CALCIUM 8.9 9.2   PT/INR  Basename 11/06/11 1559  LABPROT 13.3  INR 0.99   ABG No results found for this basename: PHART:2,PCO2:2,PO2:2,HCO3:2 in the last 72 hours  Studies/Results: Dg Abd 1 View  11/08/2011  *RADIOLOGY REPORT*  Clinical Data: Small bowel obstruction.  ABDOMEN - 1 VIEW  Comparison: 03/15 and 11/06/2011  Findings: NG tube is looped in the stomach.  Small bowel dilatation has increased since the prior study.  Colon is not distended. Contrast remains in the bladder from the prior CT scan.  IMPRESSION: Progressive small bowel obstruction.  Original Report Authenticated By: Gwynn Burly, M.D.   Dg Abd 1 View  11/07/2011  *RADIOLOGY REPORT*  Clinical Data: Small bowel obstruction.  Abdominal pain.  ABDOMEN - 1 VIEW  Comparison: CT scan and radiographs dated 11/06/2011  Findings: There are multiple dilated loops of small bowel, increased since the prior exam.  NG tube is coiled in the stomach.  No large bowel dilatation.  Contrast is seen in the bladder from the prior CT scan.  IMPRESSION: Increased small bowel dilatation  consistent with progressive small- bowel obstruction.  Original Report Authenticated By: Gwynn Burly, M.D.    Anti-infectives: Anti-infectives    None      Assessment/Plan: s/p * No surgery found * SBO - continue NGT, WBC down and symptoms better, films not much change though there is some air in the colon, will check film in AM and may need surgery if doesn't continue to improve  LOS: 3 days    Hugh Kamara E 11/09/2011

## 2011-11-09 NOTE — Progress Notes (Signed)
Subjective: Passing flatus! Abdomen does not feel as big and full. Denies any new pains. Frustrated that she cannot sleep  Objective:  Vital Signs: Filed Vitals:   11/08/11 0508 11/08/11 1410 11/08/11 2145 11/09/11 0351  BP: 150/66 153/65 159/70 167/79  Pulse: 96 99 87 86  Temp: 98 F (36.7 C) 97.8 F (36.6 C) 97.1 F (36.2 C) 96.6 F (35.9 C)  TempSrc: Oral Oral Oral Axillary  Resp: 18 18 17 19   Height:      Weight: 51.4 kg (113 lb 5.1 oz)   54.8 kg (120 lb 13 oz)  SpO2: 92% 91% 90% 91%     EXAM:   ABD: soft, less distended. Mild diffuse tenderness.    Intake/Output Summary (Last 24 hours) at 11/09/11 0951 Last data filed at 11/09/11 0700  Gross per 24 hour  Intake    500 ml  Output    725 ml  Net   -225 ml    Lab Results:  Basename 11/08/11 0900 11/07/11 0600  NA 143 139  K 3.8 4.4  CL 111 106  CO2 23 23  GLUCOSE 145* 214*  BUN 34* 31*  CREATININE 0.64 0.78  CALCIUM 8.9 9.2  MG -- --  PHOS -- --    Basename 11/06/11 1559  AST 25  ALT 11  ALKPHOS 83  BILITOT 0.5  PROT 6.8  ALBUMIN 3.7    Basename 11/06/11 1559  LIPASE 17  AMYLASE --    Basename 11/08/11 0900 11/07/11 0600 11/06/11 1559  WBC 11.6* 15.0* --  NEUTROABS -- -- 10.6*  HGB 11.3* 13.8 --  HCT 34.5* 42.1 --  MCV 99.1 97.9 --  PLT 197 280 --   No results found for this basename: CKTOTAL:3,CKMB:3,CKMBINDEX:3,TROPONINI:3 in the last 72 hours No components found with this basename: POCBNP:3 No results found for this basename: DDIMER:2 in the last 72 hours No results found for this basename: HGBA1C:2 in the last 72 hours No results found for this basename: CHOL:2,HDL:2,LDLCALC:2,TRIG:2,CHOLHDL:2,LDLDIRECT:2 in the last 72 hours No results found for this basename: TSH,T4TOTAL,FREET3,T3FREE,THYROIDAB in the last 72 hours No results found for this basename: VITAMINB12:2,FOLATE:2,FERRITIN:2,TIBC:2,IRON:2,RETICCTPCT:2 in the last 72 hours  Studies/Results: Dg Abd 1 View  11/08/2011   *RADIOLOGY REPORT*  Clinical Data: Small bowel obstruction.  ABDOMEN - 1 VIEW  Comparison: 03/15 and 11/06/2011  Findings: NG tube is looped in the stomach.  Small bowel dilatation has increased since the prior study.  Colon is not distended. Contrast remains in the bladder from the prior CT scan.  IMPRESSION: Progressive small bowel obstruction.  Original Report Authenticated By: Gwynn Burly, M.D.   Dg Abd Portable 1v  11/09/2011  *RADIOLOGY REPORT*  Clinical Data: Small bowel obstruction  PORTABLE ABDOMEN - 1 VIEW  Comparison: 11/08/2011  Findings: NG tube is looped in the stomach and extends back into the lower esophagus.  Side port of the NG tube at the GE junction. Persistent dilated small bowel wall, measuring 4.8 cm in diameter. Minimal interval change.  Colon contains stool but appears collapsed.  Minimal interval change.  IMPRESSION: Persistent small bowel obstruction pattern.  Original Report Authenticated By: Judie Petit. Ruel Favors, M.D.   Medications: Scheduled Meds:   . heparin  5,000 Units Subcutaneous Q8H  . metoprolol  2.5 mg Intravenous Q6H  . pantoprazole (PROTONIX) IV  40 mg Intravenous QHS   Continuous Infusions:   . sodium chloride 75 mL/hr at 11/08/11 2013   PRN Meds:.acetaminophen, acetaminophen, hydrALAZINE, morphine, ondansetron (ZOFRAN) IV, ondansetron, phenol  Assessment/Plan: Principal Problem:  *Small bowel obstruction - improving clinically. Dr. Carollee Massed note and recommendations noted. Xrays and labs in AM.  Active Problems:  Chronic systolic congestive heart failure - not that 2D echo report to get to chart today.   Hypertension - BP is pretty good. No need for additional meds  Mild dehydration   LOS: 3 days   [name] 11/09/2011, 9:51 AM

## 2011-11-09 NOTE — Progress Notes (Signed)
Subjective:  Did not sleep well.  Still with abdominal pain.  Not SOB.  No chest pain.  Passing flatus.  Objective:  Vital Signs in the last 24 hours: BP 167/79  Pulse 86  Temp(Src) 96.6 F (35.9 C) (Axillary)  Resp 19  Ht 4\' 9"  (1.448 m)  Wt 54.8 kg (120 lb 13 oz)  BMI 26.14 kg/m2  SpO2 91%  Physical Exam: Elderly WF with NG in place Lungs:  Clear Cardiac:  Regular rhythm, normal S1 and S2, no S3 Abdomen:   Mild tender, hernia present, bowel sounds present Extremities:  1+ edema present  Intake/Output from previous day: 03/16 0701 - 03/17 0700 In: 500 [NG/GT:500] Out: 725 [Urine:725]  Lab Results: Basic Metabolic Panel:  Basename 11/08/11 0900 11/07/11 0600  NA 143 139  K 3.8 4.4  CL 111 106  CO2 23 23  GLUCOSE 145* 214*  BUN 34* 31*  CREATININE 0.64 0.78    CBC:  Basename 11/08/11 0900 11/07/11 0600 11/06/11 1559  WBC 11.6* 15.0* --  NEUTROABS -- -- 10.6*  HGB 11.3* 13.8 --  HCT 34.5* 42.1 --  MCV 99.1 97.9 --  PLT 197 280 --    Telemetry: Sinus with paced rhythm  Assessment/Plan:  1. Cardiomyopathy stable 2. SBO 3. No labs back  Rec:  Await surgical disposition.  Check ECHO done late yesterday.  Will leave report on chart.  Darden Palmer  MD [facility]  11/09/2011, 8:56 AM

## 2011-11-10 ENCOUNTER — Inpatient Hospital Stay (HOSPITAL_COMMUNITY): Payer: Medicare Other

## 2011-11-10 LAB — DIFFERENTIAL
Basophils Absolute: 0 10*3/uL (ref 0.0–0.1)
Basophils Relative: 0 % (ref 0–1)
Eosinophils Absolute: 0.1 10*3/uL (ref 0.0–0.7)
Eosinophils Relative: 1 % (ref 0–5)
Lymphocytes Relative: 8 % — ABNORMAL LOW (ref 12–46)
Lymphs Abs: 0.4 10*3/uL — ABNORMAL LOW (ref 0.7–4.0)
Monocytes Absolute: 0.8 10*3/uL (ref 0.1–1.0)
Monocytes Relative: 14 % — ABNORMAL HIGH (ref 3–12)
Neutro Abs: 4.3 10*3/uL (ref 1.7–7.7)
Neutrophils Relative %: 77 % (ref 43–77)
WBC Morphology: INCREASED

## 2011-11-10 LAB — BASIC METABOLIC PANEL
BUN: 31 mg/dL — ABNORMAL HIGH (ref 6–23)
CO2: 22 mEq/L (ref 19–32)
Calcium: 8.5 mg/dL (ref 8.4–10.5)
Chloride: 111 mEq/L (ref 96–112)
Creatinine, Ser: 0.58 mg/dL (ref 0.50–1.10)
GFR calc Af Amer: >90 mL/min (ref >90)
GFR calc non Af Amer: 79 mL/min — ABNORMAL LOW (ref >90)
Glucose, Bld: 105 mg/dL — ABNORMAL HIGH (ref 70–99)
Potassium: 3 mEq/L — ABNORMAL LOW (ref 3.5–5.1)
Sodium: 146 mEq/L — ABNORMAL HIGH (ref 135–145)

## 2011-11-10 LAB — GLUCOSE, CAPILLARY
Glucose-Capillary: 77 mg/dL (ref 70–99)
Glucose-Capillary: 79 mg/dL (ref 70–99)

## 2011-11-10 LAB — CBC
HCT: 34.9 % — ABNORMAL LOW (ref 36.0–46.0)
Hemoglobin: 11.3 g/dL — ABNORMAL LOW (ref 12.0–15.0)
RBC: 3.47 MIL/uL — ABNORMAL LOW (ref 3.87–5.11)
WBC: 5.6 10*3/uL (ref 4.0–10.5)

## 2011-11-10 MED ORDER — METOPROLOL TARTRATE 1 MG/ML IV SOLN
5.0000 mg | Freq: Four times a day (QID) | INTRAVENOUS | Status: DC
  Administered 2011-11-10 – 2011-11-14 (×13): 5 mg via INTRAVENOUS
  Filled 2011-11-10 (×19): qty 5

## 2011-11-10 MED ORDER — POTASSIUM CHLORIDE 10 MEQ/100ML IV SOLN
10.0000 meq | INTRAVENOUS | Status: AC
  Administered 2011-11-10 (×4): 10 meq via INTRAVENOUS
  Filled 2011-11-10 (×4): qty 100

## 2011-11-10 MED ORDER — BISACODYL 10 MG RE SUPP
10.0000 mg | Freq: Once | RECTAL | Status: AC
  Administered 2011-11-10: 10 mg via RECTAL
  Filled 2011-11-10: qty 1

## 2011-11-10 NOTE — Progress Notes (Signed)
NG tube pushed back and secured.  Placement confirmed by auscultation.

## 2011-11-10 NOTE — Progress Notes (Signed)
Subjective:   Not SOB.  No chest pain.  Passing flatus.  Objective:  Vital Signs in the last 24 hours: BP 145/64  Pulse 88  Temp(Src) 96.5 F (35.8 C) (Axillary)  Resp 19  Ht 4\' 9"  (1.448 m)  Wt 56.6 kg (124 lb 12.5 oz)  BMI 27.00 kg/m2  SpO2 91%  Physical Exam: Elderly WF with NG in place Lungs:  Clear Cardiac:  Regular rhythm, normal S1 and S2, no S3 Abdomen:   Mild tender, hernia present, bowel sounds present Extremities:  1+ edema present  Intake/Output from previous day: 03/17 0701 - 03/18 0700 In: -  Out: 1060 [Urine:250; Emesis/NG output:810]  Lab Results: Basic Metabolic Panel:  Basename 11/10/11 0625 11/08/11 0900  NA 146* 143  K 3.0* 3.8  CL 111 111  CO2 22 23  GLUCOSE 105* 145*  BUN 31* 34*  CREATININE 0.58 0.64    CBC:  Basename 11/10/11 0625 11/08/11 0900  WBC 5.6 11.6*  NEUTROABS 4.3 --  HGB 11.3* 11.3*  HCT 34.9* 34.5*  MCV 100.6* 99.1  PLT 169 197    Telemetry: Sinus with paced rhythm  Echo:  EF 35%-40% moderate to sever MR, Moderate pulmonary hypertension  Assessment/Plan:  1. Cardiomyopathy stable 2. SBO 3. Hypokalemia  Rec:  Awaiting surgical disposition. Increase metoprolol to 5 mg IV q 6 hours.  Darden Palmer  MD [facility]  11/10/2011, 10:19 AM

## 2011-11-10 NOTE — Progress Notes (Signed)
  Subjective: Passing more flatus.  Minimal abdominal discomfort  Objective: Vital signs in last 24 hours: Temp:  [96.5 F (35.8 C)-98.7 F (37.1 C)] 96.5 F (35.8 C) (03/18 0700) Pulse Rate:  [87-89] 88  (03/18 0700) Resp:  [17-19] 19  (03/18 0700) BP: (132-160)/(63-70) 145/64 mmHg (03/18 0700) SpO2:  [90 %-91 %] 91 % (03/18 0700) Weight:  [124 lb 12.5 oz (56.6 kg)] 124 lb 12.5 oz (56.6 kg) (03/18 0700) Last BM Date: 11/06/11  Intake/Output from previous day: 03/17 0701 - 03/18 0700 In: -  Out: 1060 [Urine:250; Emesis/NG output:810] Intake/Output this shift:    Comfortable in appearance Abdomen full but non tender Lungs clear  Lab Results:   Basename 11/08/11 0900  WBC 11.6*  HGB 11.3*  HCT 34.5*  PLT 197   BMET  Basename 11/08/11 0900  NA 143  K 3.8  CL 111  CO2 23  GLUCOSE 145*  BUN 34*  CREATININE 0.64  CALCIUM 8.9   PT/INR No results found for this basename: LABPROT:2,INR:2 in the last 72 hours ABG No results found for this basename: PHART:2,PCO2:2,PO2:2,HCO3:2 in the last 72 hours  Studies/Results: Dg Abd Portable 1v  11/09/2011  *RADIOLOGY REPORT*  Clinical Data: Small bowel obstruction  PORTABLE ABDOMEN - 1 VIEW  Comparison: 11/08/2011  Findings: NG tube is looped in the stomach and extends back into the lower esophagus.  Side port of the NG tube at the GE junction. Persistent dilated small bowel wall, measuring 4.8 cm in diameter. Minimal interval change.  Colon contains stool but appears collapsed.  Minimal interval change.  IMPRESSION: Persistent small bowel obstruction pattern.  Original Report Authenticated By: Judie Petit. Ruel Favors, M.D.    Anti-infectives: Anti-infectives    None      Assessment/Plan: s/p * No surgery found * SBO  Will continue NG and try suppository this am.  XRAY's are minimally improved.  If no improvement by tomorrow, will need to explore abdomen  LOS: 4 days    Leanndra Pember A 11/10/2011

## 2011-11-10 NOTE — Progress Notes (Signed)
Subjective: Passing gas, less full, no pain  Objective: Vital signs in last 24 hours: Temp:  [98 F (36.7 C)-98.7 F (37.1 C)] 98 F (36.7 C) (03/17 2025) Pulse Rate:  [87-89] 89  (03/17 2025) Resp:  [17-18] 17  (03/17 2025) BP: (132-160)/(63-70) 160/70 mmHg (03/17 2025) SpO2:  [90 %-91 %] 90 % (03/17 2025) Weight change:  Last BM Date: 11/06/11  Intake/Output from previous day: 03/17 0701 - 03/18 0700 In: -  Out: 1060 [Urine:250; Emesis/NG output:810] Intake/Output this shift: Total I/O In: -  Out: 400 [Emesis/NG output:400]  General appearance: alert and cooperative Resp: clear to auscultation bilaterally Cardio: regular rate and rhythm, S1, S2 normal, no murmur, click, rub or gallop GI: soft, nontender, normal bowel sounds  Lab Results:  Basename 11/08/11 0900  WBC 11.6*  HGB 11.3*  HCT 34.5*  PLT 197   BMET  Basename 11/08/11 0900  NA 143  K 3.8  CL 111  CO2 23  GLUCOSE 145*  BUN 34*  CREATININE 0.64  CALCIUM 8.9    Studies/Results: Dg Abd 1 View  11/08/2011  *RADIOLOGY REPORT*  Clinical Data: Small bowel obstruction.  ABDOMEN - 1 VIEW  Comparison: 03/15 and 11/06/2011  Findings: NG tube is looped in the stomach.  Small bowel dilatation has increased since the prior study.  Colon is not distended. Contrast remains in the bladder from the prior CT scan.  IMPRESSION: Progressive small bowel obstruction.  Original Report Authenticated By: Gwynn Burly, M.D.   Dg Abd Portable 1v  11/09/2011  *RADIOLOGY REPORT*  Clinical Data: Small bowel obstruction  PORTABLE ABDOMEN - 1 VIEW  Comparison: 11/08/2011  Findings: NG tube is looped in the stomach and extends back into the lower esophagus.  Side port of the NG tube at the GE junction. Persistent dilated small bowel wall, measuring 4.8 cm in diameter. Minimal interval change.  Colon contains stool but appears collapsed.  Minimal interval change.  IMPRESSION: Persistent small bowel obstruction pattern.  Original  Report Authenticated By: Judie Petit. Ruel Favors, M.D.    Medications: I have reviewed the patient's current medications.  Assessment/Plan: Principal Problem:  *Small bowel obstruction  Clinically improved, xray pending Active Problems:  Chronic systolic congestive heart failure  stable  Hypertension ok  Mild dehydration   LOS: 4 days   [name] JOSEPH 11/10/2011, 6:50 AM

## 2011-11-10 NOTE — Progress Notes (Signed)
MD notified of pts SBO on xray.  Advised by MD that surgeon will be by to see her.

## 2011-11-11 ENCOUNTER — Encounter (HOSPITAL_COMMUNITY): Payer: Self-pay | Admitting: Anesthesiology

## 2011-11-11 ENCOUNTER — Inpatient Hospital Stay (HOSPITAL_COMMUNITY): Payer: Medicare Other

## 2011-11-11 ENCOUNTER — Encounter (HOSPITAL_COMMUNITY): Admission: EM | Disposition: A | Discharge status: Skilled Nursing Facility | Payer: Self-pay | Source: Home / Self Care

## 2011-11-11 ENCOUNTER — Other Ambulatory Visit: Payer: Self-pay

## 2011-11-11 ENCOUNTER — Inpatient Hospital Stay (HOSPITAL_COMMUNITY): Payer: Medicare Other | Admitting: Anesthesiology

## 2011-11-11 HISTORY — PX: INGUINAL HERNIA REPAIR: SHX194

## 2011-11-11 HISTORY — PX: BOWEL RESECTION: SHX1257

## 2011-11-11 HISTORY — PX: LAPAROTOMY: SHX154

## 2011-11-11 LAB — SURGICAL PCR SCREEN
MRSA, PCR: NEGATIVE
Staphylococcus aureus: NEGATIVE

## 2011-11-11 LAB — GLUCOSE, CAPILLARY
Glucose-Capillary: 77 mg/dL (ref 70–99)
Glucose-Capillary: 84 mg/dL (ref 70–99)
Glucose-Capillary: 79 mg/dL (ref 70–99)

## 2011-11-11 LAB — BASIC METABOLIC PANEL
BUN: 28 mg/dL — ABNORMAL HIGH (ref 6–23)
CO2: 23 mEq/L (ref 19–32)
Chloride: 114 mEq/L — ABNORMAL HIGH (ref 96–112)
GFR calc Af Amer: >90 mL/min (ref >90)
GFR calc non Af Amer: 82 mL/min — ABNORMAL LOW (ref >90)
Glucose, Bld: 82 mg/dL (ref 70–99)
Potassium: 3.3 mEq/L — ABNORMAL LOW (ref 3.5–5.1)
Potassium: 3.4 mEq/L — ABNORMAL LOW (ref 3.5–5.1)
Sodium: 148 mEq/L — ABNORMAL HIGH (ref 135–145)
Sodium: 146 mEq/L — ABNORMAL HIGH (ref 135–145)

## 2011-11-11 LAB — CBC
HCT: 35.2 % — ABNORMAL LOW (ref 36.0–46.0)
Hemoglobin: 11.3 g/dL — ABNORMAL LOW (ref 12.0–15.0)
MCHC: 32.1 g/dL (ref 30.0–36.0)
RDW: 15 % (ref 11.5–15.5)
WBC: 8.5 10*3/uL (ref 4.0–10.5)

## 2011-11-11 SURGERY — LAPAROTOMY, EXPLORATORY
Anesthesia: General | Site: Groin | Wound class: Clean Contaminated

## 2011-11-11 MED ORDER — 0.9 % SODIUM CHLORIDE (POUR BTL) OPTIME
TOPICAL | Status: DC | PRN
  Administered 2011-11-11 (×4): 1000 mL

## 2011-11-11 MED ORDER — SODIUM CHLORIDE 0.9 % IV SOLN
1.0000 g | INTRAVENOUS | Status: AC
  Administered 2011-11-11: 1 g via INTRAVENOUS
  Filled 2011-11-11: qty 1

## 2011-11-11 MED ORDER — DIPHENHYDRAMINE HCL 12.5 MG/5ML PO ELIX
12.5000 mg | ORAL_SOLUTION | Freq: Four times a day (QID) | ORAL | Status: DC | PRN
  Filled 2011-11-11: qty 5

## 2011-11-11 MED ORDER — ACETAMINOPHEN 325 MG PO TABS: 325.0000 mg | ORAL_TABLET | ORAL | Status: DC | PRN

## 2011-11-11 MED ORDER — ONDANSETRON HCL 4 MG PO TABS: 4.0000 mg | ORAL_TABLET | Freq: Four times a day (QID) | ORAL | Status: DC | PRN

## 2011-11-11 MED ORDER — MIDAZOLAM HCL 5 MG/5ML IJ SOLN
INTRAMUSCULAR | Status: DC | PRN
  Administered 2011-11-11: 1 mg via INTRAVENOUS

## 2011-11-11 MED ORDER — ONDANSETRON HCL 4 MG/2ML IJ SOLN: 4.0000 mg | Freq: Four times a day (QID) | INTRAMUSCULAR | Status: DC | PRN

## 2011-11-11 MED ORDER — LACTATED RINGERS IV SOLN
INTRAVENOUS | Status: DC
  Administered 2011-11-11: 13:00:00 via INTRAVENOUS

## 2011-11-11 MED ORDER — ONDANSETRON HCL 4 MG/2ML IJ SOLN
INTRAMUSCULAR | Status: DC | PRN
  Administered 2011-11-11: 4 mg via INTRAVENOUS

## 2011-11-11 MED ORDER — SODIUM CHLORIDE 0.9 % IV SOLN
INTRAVENOUS | Status: DC | PRN
  Administered 2011-11-11: 14:00:00 via INTRAVENOUS

## 2011-11-11 MED ORDER — MORPHINE SULFATE (PF) 1 MG/ML IV SOLN
INTRAVENOUS | Status: DC
  Administered 2011-11-11: 17:00:00 via INTRAVENOUS
  Administered 2011-11-12 (×3): 2 mg via INTRAVENOUS
  Administered 2011-11-12 (×3): 1 mg via INTRAVENOUS
  Administered 2011-11-13: 1.94 mg via INTRAVENOUS
  Filled 2011-11-11: qty 25

## 2011-11-11 MED ORDER — POTASSIUM CHLORIDE 10 MEQ/100ML IV SOLN
10.0000 meq | INTRAVENOUS | Status: AC
  Administered 2011-11-11 (×2): 10 meq via INTRAVENOUS
  Filled 2011-11-11 (×2): qty 100

## 2011-11-11 MED ORDER — PROMETHAZINE HCL 25 MG/ML IJ SOLN: 6.2500 mg | INTRAMUSCULAR | Status: DC | PRN

## 2011-11-11 MED ORDER — FENTANYL CITRATE 0.05 MG/ML IJ SOLN: 25.0000 ug | INTRAMUSCULAR | Status: DC | PRN

## 2011-11-11 MED ORDER — NALOXONE HCL 0.4 MG/ML IJ SOLN: 0.4000 mg | INTRAMUSCULAR | Status: DC | PRN

## 2011-11-11 MED ORDER — MEPERIDINE HCL 50 MG/ML IJ SOLN: 6.2500 mg | INTRAMUSCULAR | Status: DC | PRN

## 2011-11-11 MED ORDER — SODIUM CHLORIDE 0.9 % IJ SOLN: 9.0000 mL | INTRAMUSCULAR | Status: DC | PRN

## 2011-11-11 MED ORDER — ROCURONIUM BROMIDE 100 MG/10ML IV SOLN
INTRAVENOUS | Status: DC | PRN
  Administered 2011-11-11: 10 mg via INTRAVENOUS
  Administered 2011-11-11: 30 mg via INTRAVENOUS

## 2011-11-11 MED ORDER — LIDOCAINE HCL (CARDIAC) 20 MG/ML IV SOLN
INTRAVENOUS | Status: DC | PRN
  Administered 2011-11-11: 50 mg via INTRAVENOUS

## 2011-11-11 MED ORDER — LACTATED RINGERS IV BOLUS (SEPSIS)
1000.0000 mL | Freq: Once | INTRAVENOUS | Status: AC
  Administered 2011-11-11: 1000 mL via INTRAVENOUS

## 2011-11-11 MED ORDER — LACTATED RINGERS IV SOLN
INTRAVENOUS | Status: DC | PRN
  Administered 2011-11-11: 14:00:00 via INTRAVENOUS

## 2011-11-11 MED ORDER — ETOMIDATE 2 MG/ML IV SOLN
INTRAVENOUS | Status: DC | PRN
  Administered 2011-11-11: 12 mg via INTRAVENOUS

## 2011-11-11 MED ORDER — GLYCOPYRROLATE 0.2 MG/ML IJ SOLN
INTRAMUSCULAR | Status: DC | PRN
  Administered 2011-11-11: .5 mg via INTRAVENOUS

## 2011-11-11 MED ORDER — MORPHINE SULFATE (PF) 1 MG/ML IV SOLN
INTRAVENOUS | Status: AC
  Administered 2011-11-11: 10 mg
  Administered 2011-11-12: 1 mg
  Filled 2011-11-11: qty 25

## 2011-11-11 MED ORDER — DIPHENHYDRAMINE HCL 50 MG/ML IJ SOLN: 12.5000 mg | Freq: Four times a day (QID) | INTRAMUSCULAR | Status: DC | PRN

## 2011-11-11 MED ORDER — HEPARIN SODIUM (PORCINE) 5000 UNIT/ML IJ SOLN
5000.0000 [IU] | Freq: Three times a day (TID) | INTRAMUSCULAR | Status: DC
  Administered 2011-11-12 – 2011-11-18 (×17): 5000 [IU] via SUBCUTANEOUS
  Filled 2011-11-11 (×21): qty 1

## 2011-11-11 MED ORDER — FENTANYL CITRATE 0.05 MG/ML IJ SOLN
INTRAMUSCULAR | Status: DC | PRN
  Administered 2011-11-11 (×2): 50 ug via INTRAVENOUS
  Administered 2011-11-11: 100 ug via INTRAVENOUS
  Administered 2011-11-11 (×6): 50 ug via INTRAVENOUS

## 2011-11-11 MED ORDER — NEOSTIGMINE METHYLSULFATE 1 MG/ML IJ SOLN
INTRAMUSCULAR | Status: DC | PRN
  Administered 2011-11-11: 3 mg via INTRAVENOUS

## 2011-11-11 MED ORDER — SUCCINYLCHOLINE CHLORIDE 20 MG/ML IJ SOLN
INTRAMUSCULAR | Status: DC | PRN
  Administered 2011-11-11: 80 mg via INTRAVENOUS

## 2011-11-11 MED ORDER — MIDAZOLAM HCL 2 MG/2ML IJ SOLN: 0.5000 mg | Freq: Once | INTRAMUSCULAR | Status: DC | PRN

## 2011-11-11 SURGICAL SUPPLY — 46 items
BLADE SURG ROTATE 9660 (MISCELLANEOUS) IMPLANT
CANISTER SUCTION 2500CC (MISCELLANEOUS) ×3 IMPLANT
CLOTH BEACON ORANGE TIMEOUT ST (SAFETY) ×3 IMPLANT
COVER SURGICAL LIGHT HANDLE (MISCELLANEOUS) ×3 IMPLANT
DRAPE LAPAROSCOPIC ABDOMINAL (DRAPES) ×3 IMPLANT
DRAPE WARM FLUID 44X44 (DRAPE) ×3 IMPLANT
DRESSING TELFA 8X3 (GAUZE/BANDAGES/DRESSINGS) ×1 IMPLANT
ELECT REM PT RETURN 9FT ADLT (ELECTROSURGICAL) ×3
ELECTRODE REM PT RTRN 9FT ADLT (ELECTROSURGICAL) ×2 IMPLANT
GLOVE BIOGEL PI IND STRL 7.0 (GLOVE) IMPLANT
GLOVE BIOGEL PI IND STRL 8 (GLOVE) IMPLANT
GLOVE BIOGEL PI INDICATOR 7.0 (GLOVE) ×3
GLOVE BIOGEL PI INDICATOR 8 (GLOVE) ×1
GLOVE ECLIPSE 7.5 STRL STRAW (GLOVE) ×1 IMPLANT
GLOVE SURG SIGNA 7.5 PF LTX (GLOVE) ×3 IMPLANT
GLOVE SURG SS PI 6.5 STRL IVOR (GLOVE) ×3 IMPLANT
GOWN PREVENTION PLUS XLARGE (GOWN DISPOSABLE) ×3 IMPLANT
GOWN STRL NON-REIN LRG LVL3 (GOWN DISPOSABLE) ×7 IMPLANT
KIT BASIN OR (CUSTOM PROCEDURE TRAY) ×3 IMPLANT
KIT ROOM TURNOVER OR (KITS) ×3 IMPLANT
LIGASURE IMPACT 36 18CM CVD LR (INSTRUMENTS) ×1 IMPLANT
NS IRRIG 1000ML POUR BTL (IV SOLUTION) ×6 IMPLANT
PACK GENERAL/GYN (CUSTOM PROCEDURE TRAY) ×3 IMPLANT
PAD ARMBOARD 7.5X6 YLW CONV (MISCELLANEOUS) ×6 IMPLANT
RELOAD PROXIMATE 75MM BLUE (ENDOMECHANICALS) ×6 IMPLANT
RELOAD STAPLE 75 3.8 BLU REG (ENDOMECHANICALS) IMPLANT
SPECIMEN JAR LARGE (MISCELLANEOUS) ×1 IMPLANT
SPONGE GAUZE 4X4 12PLY (GAUZE/BANDAGES/DRESSINGS) ×3 IMPLANT
SPONGE LAP 18X18 X RAY DECT (DISPOSABLE) ×1 IMPLANT
STAPLER GUN LINEAR PROX 60 (STAPLE) ×1 IMPLANT
STAPLER PROXIMATE 75MM BLUE (STAPLE) ×1 IMPLANT
STAPLER VISISTAT 35W (STAPLE) ×3 IMPLANT
SUCTION POOLE TIP (SUCTIONS) ×1 IMPLANT
SUT PDS AB 1 TP1 96 (SUTURE) ×6 IMPLANT
SUT PROLENE 1 CT (SUTURE) ×1 IMPLANT
SUT SILK 2 0 SH CR/8 (SUTURE) ×3 IMPLANT
SUT SILK 2 0 TIES 10X30 (SUTURE) ×3 IMPLANT
SUT SILK 3 0 SH CR/8 (SUTURE) ×3 IMPLANT
SUT SILK 3 0 TIES 10X30 (SUTURE) ×3 IMPLANT
SUT VIC AB 3-0 SH 18 (SUTURE) IMPLANT
TAPE CLOTH SURG 4X10 WHT LF (GAUZE/BANDAGES/DRESSINGS) ×1 IMPLANT
TOWEL OR 17X24 6PK STRL BLUE (TOWEL DISPOSABLE) ×3 IMPLANT
TOWEL OR 17X26 10 PK STRL BLUE (TOWEL DISPOSABLE) ×3 IMPLANT
TRAY FOLEY CATH 14FRSI W/METER (CATHETERS) ×1 IMPLANT
WATER STERILE IRR 1000ML POUR (IV SOLUTION) ×2 IMPLANT
YANKAUER SUCT BULB TIP NO VENT (SUCTIONS) IMPLANT

## 2011-11-11 NOTE — Progress Notes (Signed)
Patient ID: [name], female   DOB: [birth year], 78 y.o.   MRN: [id number] Subjective: Little abdominal pain; c/o sore throat. ? Small loose BM per nursing Exam still no BS, mild distended No CP, no SOB Low K  Objective: Vital signs in last 24 hours: Temp:  [97.9 F (36.6 C)-98.2 F (36.8 C)] 97.9 F (36.6 C) (03/19 0439) Pulse Rate:  [80-85] 80  (03/19 0439) Resp:  [18] 18  (03/19 0439) BP: (145-148)/(62-69) 147/65 mmHg (03/19 0439) SpO2:  [90 %-93 %] 92 % (03/19 0439)   Intake/Output from previous day: 03/18 0701 - 03/19 0700 In: 725 [I.V.:525; IV Piggyback:200] Out: 1100 [Urine:800; Emesis/NG output:300]    General appearance: alert Resp: clear to auscultation bilaterally Cardio: regular rate and rhythm, S1, S2 normal, no murmur, click, rub or gallop GI: soft,  mild distention, no BS  Lab Results:  Basename 11/10/11 0625 11/08/11 0900  WBC 5.6 11.6*  HGB 11.3* 11.3*  HCT 34.9* 34.5*  PLT 169 197   BMET  Basename 11/10/11 0625 11/08/11 0900  NA 146* 143  K 3.0* 3.8  CL 111 111  CO2 22 23  GLUCOSE 105* 145*  BUN 31* 34*  CREATININE 0.58 0.64  CALCIUM 8.5 8.9   Lab Results  Component Value Date   ALT 11 11/06/2011   AST 25 11/06/2011   ALKPHOS 83 11/06/2011   BILITOT 0.5 11/06/2011    Assessment/Plan:  Principal Problem:  *Small bowel obstruction Still no significant improve; with NG and NPO/ day 4  Surgery following/ ? Exp lap  Per surgery Xray today Active Problems:  Chronic systolic congestive heart failure Stable card following Replace K  Hypertension  Mild dehydration   [name] 11/11/2011, 7:38 AM

## 2011-11-11 NOTE — Progress Notes (Signed)
Pt. Had 6 beats of V-tach, pacemaker kicked in, and patient had 11 more beats of V-tach, pacemaker kicked in again. VS stable, pt asymptomatic. Will continue to monitor.

## 2011-11-11 NOTE — Anesthesia Preprocedure Evaluation (Addendum)
Anesthesia Evaluation  Patient identified by MRN, date of birth, ID band Patient awake    Reviewed: Allergy & Precautions, H&P , Patient's Chart, lab work & pertinent test results, reviewed documented beta blocker date and time   History of Anesthesia Complications Negative for: history of anesthetic complications  Airway Mallampati: II TM Distance: >3 FB Neck ROM: full    Dental No notable dental hx.    Pulmonary neg pulmonary ROS, shortness of breath,  breath sounds clear to auscultation  Pulmonary exam normal       Cardiovascular Exercise Tolerance: Good hypertension, negative cardio ROS  + dysrhythmias + pacemaker Rhythm:regular Rate:Normal     Neuro/Psych PSYCHIATRIC DISORDERS negative neurological ROS  negative psych ROS   GI/Hepatic negative GI ROS, Neg liver ROS,   Endo/Other  negative endocrine ROS  Renal/GU negative Renal ROS     Musculoskeletal   Abdominal   Peds  Hematology negative hematology ROS (+)   Anesthesia Other Findings Biventricular pacemake    Chronic systolic congestive heart failure    Aortic valve regurgitation    Hyperlipidemia    Mitral valve disorder    Dyspnea    Anxiety    Depression    Aneurysm of abdominal aorta    Hypertension    Small bowel obstruction    Mild dehydration  LBBB - Left ventricle: The cavity size was normal. There was   severe concentric hypertrophy. Systolic function was   moderately reduced. The estimated ejection fraction was in   the range of 35% to 40%. Moderate diffuse hypokinesis. - Aortic valve: Mild regurgitation. - Mitral valve: Moderately calcified annulus. Mildly   thickened leaflets . Moderate to severe regurgitation. - Left atrium: The atrium was severely dilated. - Pulmonary arteries: Systolic pressure was moderately to   severely increased. PA peak pressure: 67mm Hg (S). Transthoracic echocardiography.  M-mode, complete 2D, spectral  Doppler, and color Doppler.  Height:  Height: 144.8cm. Height: 57in.  Weight:  Weight: 51.3kg. Weight: 112.8lb.  Body mass index:  BMI: 24.5kg/m^2.  Body surface area:    BSA: 1.66m^2.  Blood pressure:     153/65.  Patient status:  Inpatient.  Location:  Bedside.   Reproductive/Obstetrics negative OB ROS                          Anesthesia Physical Anesthesia Plan  ASA: III  Anesthesia Plan: General ETT   Post-op Pain Management:    Induction: Rapid sequence, Cricoid pressure planned and Intravenous  Airway Management Planned: Oral ETT  Additional Equipment: Arterial line  Intra-op Plan:   Post-operative Plan:   Informed Consent: I have reviewed the patients History and Physical, chart, labs and discussed the procedure including the risks, benefits and alternatives for the proposed anesthesia with the patient or authorized representative who has indicated his/her understanding and acceptance.   Dental Advisory Given  Plan Discussed with: CRNA and Surgeon  Anesthesia Plan Comments:        Anesthesia Quick Evaluation

## 2011-11-11 NOTE — Transfer of Care (Signed)
Immediate Anesthesia Transfer of Care Note  Patient: [name]  Procedure(s) Performed: Procedure(s) (LRB): EXPLORATORY LAPAROTOMY (N/A) SMALL BOWEL RESECTION (N/A) HERNIA REPAIR INGUINAL ADULT (Left)  Patient Location: SICU  Anesthesia Type: General  Level of Consciousness: awake  Airway & Oxygen Therapy: Patient Spontanous Breathing and Patient connected to face mask oxygen  Post-op Assessment: Report given to PACU RN, Post -op Vital signs reviewed and stable and Patient moving all extremities  Post vital signs: Reviewed and stable  Complications: No apparent anesthesia complications

## 2011-11-11 NOTE — Progress Notes (Signed)
Dr.Blackman notified of chest xray report from this am that states NG coils once in stomach and extends back up into mid esophagus. No new orders at present., pt off suction for transport to surgery. Pt asymptomatic

## 2011-11-11 NOTE — Anesthesia Postprocedure Evaluation (Signed)
  Anesthesia Post-op Note  Patient: [name]  Procedure(s) Performed: Procedure(s) (LRB): EXPLORATORY LAPAROTOMY (N/A) SMALL BOWEL RESECTION (N/A) HERNIA REPAIR INGUINAL ADULT (Left)  Patient Location: SICU  Anesthesia Type: General  Level of Consciousness: awake  Airway and Oxygen Therapy: Patient Spontanous Breathing and Patient connected to face mask oxygen  Post-op Pain: severe  Post-op Assessment: Post-op Vital signs reviewed, Patient's Cardiovascular Status Stable, Respiratory Function Stable and Patent Airway  Post-op Vital Signs: Reviewed and stable  Complications: No apparent anesthesia complications

## 2011-11-11 NOTE — Op Note (Signed)
11/06/2011 - 11/11/2011  2:49 PM  PATIENT:  [name]  78 y.o. female  PRE-OPERATIVE DIAGNOSIS:  Small Bowel Obstruction  POST-OPERATIVE DIAGNOSIS:  Small Bowel Obstruction  PROCEDURE:  Procedure(s) (LRB): EXPLORATORY LAPAROTOMY (N/A) SMALL BOWEL RESECTION (N/A) HERNIA REPAIR INGUINAL ADULT (Left)  SURGEON:  Surgeon(s) and Role:    * Shelly Rubenstein, MD - Primary  PHYSICIAN ASSISTANT: Jettie Pagan, PA  ASSISTANTS: none   ANESTHESIA:   general  EBL:  Total I/O In: -  Out: 10 [Urine:10]  BLOOD ADMINISTERED:none  DRAINS: none   LOCAL MEDICATIONS USED:  MARCAINE     SPECIMEN:  Excision  DISPOSITION OF SPECIMEN:  PATHOLOGY  COUNTS:  YES  TOURNIQUET:  * No tourniquets in log *  DICTATION: Reubin Milan Dictation Indications: This is an 78 year old female who presents with a small bowel obstruction. It would not improve despite conservative measures. The decision was made to proceed to the operating room for exploration.  Findings: The patient was found to have a closed loop obstruction of a single adhesive band. This was unrelated to her left inguinal hernia. Approximately a 1 foot segment of small bowel was ischemic with a stricture.  Procedure: The patient was brought to the operating room and identified as the correct patient. She was placed supine on the operating room table and general anesthesia was induced. Her abdomen was prepped and draped in the usual sterile fashion. A midline incision was then created with a scalpel below the umbilicus. This was carried down to the fascia with electrocautery. The peritoneum wasn't opened the entire length of the incision. Upon entering the abdomen a large amount of ascites was identified. I was able to eviscerate the bowel. The area of obstruction was easily identified. There was a single adhesive band creating a closed loop obstruction. Once the band was free, I was able to eviscerate the rest of the bowel and examined  thoroughly. There was a 1 foot segment of bowel which was ischemic and thick walled. There was also a tight stricture at the proximal end. The decision was made to resect a segment of intestines. I transected the intestines proximal and distal to the area of obstruction with the GIA-75 stapler. I then took down the mesentery with a ligature. I then reapproximated the distal and proximal small bowel and a side-to-side fashion with silk sutures. I created 2 enterotomies with cautery and performed a side-to-side anastomosis with a single firing of the GIA-75 stapler. I then closed the opening with a TA 60 stapler. I then reinforced the entire staple line with interrupted silk sutures. I closed the mesenteric defect with silk sutures as well. A wide, pink, and well perfused anastomosis appeared to be achieved. I examined the rest of the abdomen. The liver was normal. The gallbladder was slightly distended but no stones were felt. Patient had a large amount of diverticuli in her colon. I then reapproximated the small left renal hernia defect with a figure-of-eight #1 Prolene suture. I then irrigated the abdomen with several liters of normal saline. The midline fascia was then closed with a running #1 looped PDS suture. The skin was irrigated and closed with skin staples. The patient tolerated the procedure well. All the counts were correct at the end of the procedure. The patient was then extubated in the operating room and taken in a stable condition to the recovery room. PLAN OF CARE: Admit to inpatient   PATIENT DISPOSITION:  PACU - hemodynamically stable.   Delay start of Pharmacological  VTE agent (>24hrs) due to surgical blood loss or risk of bleeding: no

## 2011-11-11 NOTE — Progress Notes (Signed)
  Subjective: Still with some cramping abdominal pain Minimal results with suppos  Objective: Vital signs in last 24 hours: Temp:  [97.9 F (36.6 C)-98.2 F (36.8 C)] 97.9 F (36.6 C) (03/19 0439) Pulse Rate:  [80-85] 80  (03/19 0439) Resp:  [18] 18  (03/19 0439) BP: (145-148)/(62-69) 147/65 mmHg (03/19 0439) SpO2:  [90 %-93 %] 92 % (03/19 0439) Last BM Date: 2011-11-29  Intake/Output from previous day: 29-Nov-2022 0701 - 03/19 0700 In: 725 [I.V.:525; IV Piggyback:200] Out: 1100 [Urine:800; Emesis/NG output:300] Intake/Output this shift:    Abdomen distended with minimal tenderness  Lab Results:   Basename 2011-11-29 0625 11/08/11 0900  WBC 5.6 11.6*  HGB 11.3* 11.3*  HCT 34.9* 34.5*  PLT 169 197   BMET  Basename 11/29/2011 0625 11/08/11 0900  NA 146* 143  K 3.0* 3.8  CL 111 111  CO2 22 23  GLUCOSE 105* 145*  BUN 31* 34*  CREATININE 0.58 0.64  CALCIUM 8.5 8.9   PT/INR No results found for this basename: LABPROT:2,INR:2 in the last 72 hours ABG No results found for this basename: PHART:2,PCO2:2,PO2:2,HCO3:2 in the last 72 hours  Studies/Results: Dg Abd 2 Views  29-Nov-2011  *RADIOLOGY REPORT*  Clinical Data: Small bowel obstruction.  ABDOMEN - 2 VIEW  Comparison: 11/09/2011.  Findings: There are markedly dilated loops of small bowel the central abdomen, with associated air fluid levels.  Stool is seen in the colon, which is otherwise decompressed. No free air. Nasogastric tube is looped in the stomach, with the tip coursing back into the lower esophagus.  IMPRESSION:  1.  Small bowel obstruction. 2.  Nasogastric tube is malpositioned with the tip in the lower esophagus. These results will be called to the ordering clinician or representative by the Radiologist Assistant, and communication documented in the PACS Dashboard.  Original Report Authenticated By: Reyes Ivan, M.D.    Anti-infectives: Anti-infectives    None      Assessment/Plan: s/p * No surgery found  *  Persistent SBO  Needs exploratory laparotomy.  Discussed with Dr. Eula Listen who agrees.  I have discussed with patient and will discuss with her daughter the risks which include but are not limited to bleeding, infection, need for bowel resection, cardiopulmonary problems, etc.  Likelihood of success is moderate  LOS: 5 days    Toussaint Golson A 11/11/2011

## 2011-11-12 ENCOUNTER — Encounter (HOSPITAL_COMMUNITY): Payer: Self-pay | Admitting: Surgery

## 2011-11-12 ENCOUNTER — Inpatient Hospital Stay (HOSPITAL_COMMUNITY): Payer: Medicare Other

## 2011-11-12 LAB — GLUCOSE, CAPILLARY: Glucose-Capillary: 81 mg/dL (ref 70–99)

## 2011-11-12 LAB — BASIC METABOLIC PANEL
GFR calc Af Amer: 90 mL/min — ABNORMAL LOW (ref >90)
GFR calc non Af Amer: 78 mL/min — ABNORMAL LOW (ref >90)
Potassium: 3.6 mEq/L (ref 3.5–5.1)
Sodium: 146 mEq/L — ABNORMAL HIGH (ref 135–145)

## 2011-11-12 LAB — POCT I-STAT 3, ART BLOOD GAS (G3+)
Acid-base deficit: 5 mmol/L — ABNORMAL HIGH (ref 0.0–2.0)
O2 Saturation: 89 %
Patient temperature: 99.3
TCO2: 22 mmol/L (ref 0–100)
pH, Arterial: 7.327 — ABNORMAL LOW (ref 7.350–7.400)

## 2011-11-12 LAB — CBC
Hemoglobin: 11 g/dL — ABNORMAL LOW (ref 12.0–15.0)
RBC: 3.38 MIL/uL — ABNORMAL LOW (ref 3.87–5.11)

## 2011-11-12 NOTE — Progress Notes (Signed)
Subjective:  Sleepy, went to OR yesterday with exploratory lap and found internal loop of ischemic bowel. Not SOB today.  No Chest pain. Objective:  Vital Signs in the last 24 hours: BP 108/44  Pulse 88  Temp(Src) 97.8 F (36.6 C) (Oral)  Resp 13  Ht 4\' 9"  (1.448 m)  Wt 56.7 kg (125 lb)  BMI 27.05 kg/m2  SpO2 98%  Physical Exam: Elderly WF with NG in place, lethargic Lungs:  Clear Cardiac:  Regular rhythm, normal S1 and S2, no S3 Extremities:  1+ edema present  Intake/Output from previous day: 03/19 0701 - 03/20 0700 In: 2873 [I.V.:2813; NG/GT:60] Out: 657 [Urine:327; Emesis/NG output:280; Blood:50]  Lab Results: Basic Metabolic Panel:  Basename 11/12/11 0345 11/11/11 0808  NA 146* 146*  K 3.6 3.4*  CL 117* 114*  CO2 20 20  GLUCOSE 88 78  BUN 25* 28*  CREATININE 0.62 0.53    CBC:  Basename 11/12/11 0345 11/11/11 0808 11/10/11 0625  WBC 9.4 8.5 --  NEUTROABS -- -- 4.3  HGB 11.0* 11.3* --  HCT 34.2* 35.2* --  MCV 101.2* 100.0 --  PLT 167 167 --    Telemetry: Sinus with paced rhythm.  One run of AIVR noted sustained and brief runs of AIVR and pVC's   Assessment/Plan:  1. Reasonably stable post op exploratory lap 2. AIVR likely secondary to cardiomyopathy 3. Hypokalemia  Rec:  Continue IV beta blocker and advance surgical course.  Resume oral cardiac meds when able to take po.  Darden Palmer  MD [facility] Cardiology  11/12/2011, 9:14 AM

## 2011-11-12 NOTE — Progress Notes (Signed)
At [phone] patient had an 11 beat run of vtach. At [phone] she had additional 5 beat run of vtach. Pacer kicked in. Asymptomatic. VS stable during both episodes. Will continue to closely monitor. Thresa Ross RN

## 2011-11-12 NOTE — Progress Notes (Signed)
1 Day Post-Op  Subjective: Slept well.  Slightly anxious this am but comfortable  Objective: Vital signs in last 24 hours: Temp:  [97.5 F (36.4 C)-99.3 F (37.4 C)] 99.3 F (37.4 C) (03/20 0300) Pulse Rate:  [83-106] 83  (03/20 0700) Resp:  [14-25] 16  (03/20 0700) BP: (87-154)/(34-89) 118/89 mmHg (03/20 0700) SpO2:  [85 %-99 %] 95 % (03/20 0700) Arterial Line BP: (84-162)/(33-58) 118/41 mmHg (03/20 0700) FiO2 (%):  [35 %-50 %] 50 % (03/20 0700) Weight:  [125 lb (56.7 kg)] 125 lb (56.7 kg) (03/20 0400) Last BM Date: 11/10/11  Intake/Output from previous day: 03/19 0701 - 03/20 0700 In: 2798 [I.V.:2738; NG/GT:60] Out: 657 [Urine:327; Emesis/NG output:280; Blood:50] Intake/Output this shift:   Abdomen soft, dressing dry  Lab Results:   Basename 11/12/11 0345 11/11/11 0808  WBC 9.4 8.5  HGB 11.0* 11.3*  HCT 34.2* 35.2*  PLT 167 167   BMET  Basename 11/12/11 0345 11/11/11 0808  NA 146* 146*  K 3.6 3.4*  CL 117* 114*  CO2 20 20  GLUCOSE 88 78  BUN 25* 28*  CREATININE 0.62 0.53  CALCIUM 7.8* 8.3*   PT/INR No results found for this basename: LABPROT:2,INR:2 in the last 72 hours ABG  Basename 11/12/11 0556  PHART 7.327*  HCO3 20.4    Studies/Results: Dg Abd 1 View  11/11/2011  *RADIOLOGY REPORT*  Clinical Data: Small bowel obstruction.  Abdominal discomfort.  ABDOMEN - 1 VIEW  Comparison: 11/10/2011  Findings: Nasogastric tube seems to be coiled in the vicinity of the stomach, only a small portion of the tube is included on today's exam.  Abnormal dilated loops of small bowel measure up to 6.6 cm in diameter, mildly increased compared to yesterday's exam.  There appears to be left over contrast medium from the CT scan of 11/06/2011 in the colon.  Lumbar spondylosis is present.  A right hip gamma nail is noted.  Vascular calcifications are present.  IMPRESSION:  1.  Increasingly dilated loops of small bowel, possibly reflecting small bowel obstruction.  Nasogastric  tube is not well visualized, but coiled segments of the NG tube project over the stomach. 2.  Lumbar spondylosis.  Original Report Authenticated By: Dellia Cloud, M.D.   Dg Chest Port 1 View  11/11/2011  *RADIOLOGY REPORT*  Clinical Data: Preoperative for exploratory laparotomy.  PORTABLE CHEST - 1 VIEW  Comparison: 07/13/2011  Findings: Stable three lead pacer noted.  A nasogastric tube extends down to the stomach, loops, and extends back up into the mid-esophagus.  Thoracic spondylosis noted with cardiomegaly.  There is mild blunting left costophrenic angle, potentially reflecting small pleural effusion.  Atherosclerotic aortic arch noted.  Low lung volumes are present.  IMPRESSION:  1.  Nasogastric tube coils once in the stomach and extends back up into the mid esophagus. 2.  Cardiomegaly. 3.  Thoracic spondylosis. 4.  Atherosclerosis. 5.  Blunted left costophrenic angle could reflect a trace left pleural effusion.  Original Report Authenticated By: Dellia Cloud, M.D.    Anti-infectives: Anti-infectives     Start     Dose/Rate Route Frequency Ordered Stop   11/11/11 0818   ertapenem (INVANZ) 1 g in sodium chloride 0.9 % 50 mL IVPB        1 g 100 mL/hr over 30 Minutes Intravenous 60 min pre-op 11/11/11 0818 11/11/11 1346          Assessment/Plan: s/p Procedure(s) (LRB): EXPLORATORY LAPAROTOMY (N/A) SMALL BOWEL RESECTION (N/A) HERNIA REPAIR INGUINAL ADULT (  Left)  Keep in ICU for now Continue NPO and NG May need pic and TNA if bowel prolonged ileus  LOS: 6 days    Hatem Cull A 11/12/2011

## 2011-11-12 NOTE — Progress Notes (Signed)
UR completed 

## 2011-11-13 LAB — CBC
HCT: 29 % — ABNORMAL LOW (ref 36.0–46.0)
Hemoglobin: 9.1 g/dL — ABNORMAL LOW (ref 12.0–15.0)
MCV: 101 fL — ABNORMAL HIGH (ref 78.0–100.0)
RBC: 2.87 MIL/uL — ABNORMAL LOW (ref 3.87–5.11)
WBC: 9.7 10*3/uL (ref 4.0–10.5)

## 2011-11-13 LAB — GLUCOSE, CAPILLARY
Glucose-Capillary: 82 mg/dL (ref 70–99)
Glucose-Capillary: 87 mg/dL (ref 70–99)
Glucose-Capillary: 89 mg/dL (ref 70–99)
Glucose-Capillary: 92 mg/dL (ref 70–99)
Glucose-Capillary: 96 mg/dL (ref 70–99)

## 2011-11-13 LAB — BASIC METABOLIC PANEL
BUN: 20 mg/dL (ref 6–23)
CO2: 23 mEq/L (ref 19–32)
Chloride: 116 mEq/L — ABNORMAL HIGH (ref 96–112)
Creatinine, Ser: 0.54 mg/dL (ref 0.50–1.10)
Glucose, Bld: 90 mg/dL (ref 70–99)
Potassium: 3.2 mEq/L — ABNORMAL LOW (ref 3.5–5.1)

## 2011-11-13 MED ORDER — MORPHINE SULFATE (PF) 1 MG/ML IV SOLN
INTRAVENOUS | Status: AC
  Administered 2011-11-13: 04:00:00
  Filled 2011-11-13: qty 25

## 2011-11-13 MED ORDER — POTASSIUM CHLORIDE 10 MEQ/100ML IV SOLN
INTRAVENOUS | Status: AC
  Administered 2011-11-13: 09:00:00
  Filled 2011-11-13: qty 300

## 2011-11-13 MED ORDER — CHLORHEXIDINE GLUCONATE 0.12 % MT SOLN
15.0000 mL | Freq: Four times a day (QID) | OROMUCOSAL | Status: DC
  Administered 2011-11-13 – 2011-11-18 (×19): 15 mL via OROMUCOSAL
  Filled 2011-11-13 (×31): qty 15

## 2011-11-13 MED ORDER — MORPHINE SULFATE 2 MG/ML IJ SOLN
1.0000 mg | INTRAMUSCULAR | Status: DC | PRN
  Administered 2011-11-13: 12:00:00 via INTRAVENOUS
  Administered 2011-11-14 – 2011-11-18 (×7): 1 mg via INTRAVENOUS
  Filled 2011-11-13 (×7): qty 1

## 2011-11-13 MED ORDER — KCL IN DEXTROSE-NACL 20-5-0.9 MEQ/L-%-% IV SOLN
INTRAVENOUS | Status: DC
  Administered 2011-11-13: 50 mL/h via INTRAVENOUS
  Administered 2011-11-14 – 2011-11-17 (×5): via INTRAVENOUS
  Filled 2011-11-13 (×10): qty 1000

## 2011-11-13 MED ORDER — POTASSIUM CHLORIDE 10 MEQ/100ML IV SOLN
10.0000 meq | INTRAVENOUS | Status: AC
  Administered 2011-11-13 (×3): 10 meq via INTRAVENOUS

## 2011-11-13 NOTE — Progress Notes (Addendum)
24ml morphine bristojet pca wasted in sink with Myra Gianotti at bedside, kelsey to co-sign as per protocol.

## 2011-11-13 NOTE — Evaluation (Signed)
Physical Therapy Evaluation Patient Details Name: [name] MRN: [id number] DOB: Aug 14, 1920 Today's Date: 11/13/2011  Problem List:  Patient Active Problem List  Diagnoses  . Biventricular pacemake  . Chronic systolic congestive heart failure  . Aortic valve regurgitation  . Hyperlipidemia  . Mitral valve disorder  . Dyspnea  . Anxiety  . Depression  . Aneurysm of abdominal aorta  . Hypertension  . Small bowel obstruction  . Mild dehydration    Past Medical History:  Past Medical History  Diagnosis Date  . Aortic valve regurgitation   . Left bundle branch block   . Hyperlipidemia   . Mitral valve disorder   . Pacemaker     For complete heart block, s/p generator change 09/2010   . Dyspnea   . Anxiety   . Depression   . Aneurysm of abdominal aorta     4.4 cm in 09/2010  . Closed intertrochanteric fracture of right hip 07/13/2011   Past Surgical History:  Past Surgical History  Procedure Date  . Replacement total knee 2008    left by Dr. Marciano Sequin  . Pacemaker insertion 2006    complete heart block  . Insert / replace / remove pacemaker 2012    upgrade to biventricular  . Femur im nail 07/14/2011    Procedure: INTRAMEDULLARY (IM) NAIL FEMORAL;  Surgeon: Eulas Post;  Location: [facility] OR;  Service: Orthopedics;  Laterality: Right;  Synthes TFN, fracture table, big C arm, available after 4p  . Left ingunial hernia   . Laparotomy 11/11/2011    Procedure: EXPLORATORY LAPAROTOMY;  Surgeon: Shelly Rubenstein, MD;  Location: [facility] OR;  Service: General;  Laterality: N/A;  . Bowel resection 11/11/2011    Procedure: SMALL BOWEL RESECTION;  Surgeon: Shelly Rubenstein, MD;  Location: [facility] OR;  Service: General;  Laterality: N/A;  . Inguinal hernia repair 11/11/2011    Procedure: HERNIA REPAIR INGUINAL ADULT;  Surgeon: Shelly Rubenstein, MD;  Location: [facility] OR;  Service: General;  Laterality: Left;    PT Assessment/Plan/Recommendation PT Assessment Clinical Impression  Statement: Pt with SBO s/p repair very pleasant but limited by fatigue. Pt will benefit from acute therapy to maximize mobility and gait to return pt to PLOF to decrease burden of care at discharge. Recommend OOB daily with nursing. PT Recommendation/Assessment: Patient will need skilled PT in the acute care venue PT Problem List: Decreased mobility;Decreased activity tolerance;Decreased knowledge of use of DME Barriers to Discharge: None PT Therapy Diagnosis : Difficulty walking PT Plan PT Frequency: Min 3X/week PT Treatment/Interventions: Gait training;DME instruction;Therapeutic activities;Therapeutic exercise;Functional mobility training;Patient/family education PT Recommendation Follow Up Recommendations: Skilled nursing facility if family decides appropriate care not available at home vs Home health PT with Supervision for mobility/OOB Equipment Recommended: None recommended by PT PT Goals  Acute Rehab PT Goals PT Goal Formulation: With patient Time For Goal Achievement: 2 weeks Pt will go Supine/Side to Sit: with supervision PT Goal: Supine/Side to Sit - Progress: Goal set today Pt will go Sit to Supine/Side: with supervision PT Goal: Sit to Supine/Side - Progress: Goal set today Pt will go Sit to Stand: with supervision PT Goal: Sit to Stand - Progress: Goal set today Pt will go Stand to Sit: with supervision PT Goal: Stand to Sit - Progress: Goal set today Pt will Ambulate: 51 - 150 feet;with least restrictive assistive device;with supervision PT Goal: Ambulate - Progress: Goal set today  PT Evaluation Precautions/Restrictions  Precautions Precautions: Fall Prior Functioning  Home Living Lives With:  Spouse (spouse currently at [facility], caregivers 24hrs at times) Dolores Lory Help From: Personal care attendant Type of Home: House Home Layout: One level Home Access: Stairs to enter;Ramped entrance Entrance Stairs-Number of Steps: 2 Bathroom Shower/Tub: Therapist, art: Standard Home Adaptive Equipment: Dan Humphreys - four wheeled;Straight cane;Bedside commode/3-in-1;Built-in shower seat Prior Function Level of Independence: Independent with transfers;Requires assistive device for independence;Needs assistance with homemaking Meal Prep: Maximal Light Housekeeping: Maximal Driving: No Vocation: Retired Comments: Spouse is discharging soon from SNF home and per caregiver have to stagger their returns home so pt doesn't try to care for spouse. Family's plan is to have pt go to ST-SNF Cognition Cognition Arousal/Alertness: Awake/alert Overall Cognitive Status: Appears within functional limits for tasks assessed Orientation Level: Oriented X4 Sensation/Coordination Sensation Light Touch: Not tested Extremity Assessment RLE Assessment RLE Assessment: Exceptions to [facility] RLE Strength RLE Overall Strength Comments: grossly 2+/5 not formally assessed LLE Assessment LLE Assessment: Exceptions to [facility] LLE Strength LLE Overall Strength Comments: grossly 2+/5 not formally assessed Mobility (including Balance) Bed Mobility Bed Mobility: Yes Rolling Left: 4: Min assist;With rail Rolling Left Details (indicate cue type and reason): cueing to sequence with use of rail Left Sidelying to Sit: 3: Mod assist;HOB flat Left Sidelying to Sit Details (indicate cue type and reason): cueing to sequence and assist to elevate trunk Sitting - Scoot to Edge of Bed: 6: Modified independent (Device/Increase time) Transfers Transfers: Yes Sit to Stand: 4: Min assist;From bed Sit to Stand Details (indicate cue type and reason): cues for hand placement and assist for anterior translation with increased time to complete transfer Stand to Sit: 5: Supervision;To chair/3-in-1;With armrests Stand to Sit Details: hand over hand assist for armrest. Pt required mod assist to scoot back in chair Ambulation/Gait Ambulation/Gait: Yes Ambulation/Gait Assistance: 4: Min  assist Ambulation/Gait Assistance Details (indicate cue type and reason): minguard with cues for positioning in RW but difficult because walker too tall at lowest position Ambulation Distance (Feet): 40 Feet Assistive device: Rolling walker Gait Pattern: Step-through pattern Gait velocity: very slow grossly 1 min for 5 feet Stairs: No  Posture/Postural Control Posture/Postural Control: No significant limitations Balance Balance Assessed: No (pt used AD PTA) Exercise    End of Session PT - End of Session Equipment Utilized During Treatment: Gait belt Activity Tolerance: Patient tolerated treatment well Patient left: in chair;with call bell in reach;with family/visitor present Nurse Communication: Mobility status for transfers;Mobility status for ambulation General Behavior During Session: [facility] for tasks performed Cognition: [facility] for tasks performed  Delorse Lek 11/13/2011, 4:11 PM  Delaney Meigs, PT [phone]

## 2011-11-13 NOTE — Progress Notes (Signed)
2 Days Post-Op  Subjective: No complaints.  comfortable  Objective: Vital signs in last 24 hours: Temp:  [97.5 F (36.4 C)-99.4 F (37.4 C)] 97.5 F (36.4 C) (03/21 0751) Pulse Rate:  [78-95] 79  (03/21 0700) Resp:  [11-23] 16  (03/21 0700) BP: (108-143)/(34-50) 143/45 mmHg (03/21 0700) SpO2:  [96 %-100 %] 99 % (03/21 0700) Arterial Line BP: (110-131)/(38-46) 131/46 mmHg (03/20 1700) FiO2 (%):  [50 %] 50 % (03/21 0700) Last BM Date: 11/10/11  Intake/Output from previous day: 03/20 0701 - 03/21 0700 In: 1968.9 [P.O.:40; I.V.:1808.9; NG/GT:120] Out: 1120 [Urine:870; Emesis/NG output:250] Intake/Output this shift: Total I/O In: -  Out: 30 [Emesis/NG output:30]  Abdomen soft, incision clean NG output minimal  Lab Results:   Basename 11/13/11 0313 11/12/11 0345  WBC 9.7 9.4  HGB 9.1* 11.0*  HCT 29.0* 34.2*  PLT 150 167   BMET  Basename 11/13/11 0313 11/12/11 0345  NA 149* 146*  K 3.2* 3.6  CL 116* 117*  CO2 23 20  GLUCOSE 90 88  BUN 20 25*  CREATININE 0.54 0.62  CALCIUM 8.4 7.8*   PT/INR No results found for this basename: LABPROT:2,INR:2 in the last 72 hours ABG  Basename 11/12/11 0556  PHART 7.327*  HCO3 20.4    Studies/Results: Dg Abd 1 View  11/11/2011  *RADIOLOGY REPORT*  Clinical Data: Small bowel obstruction.  Abdominal discomfort.  ABDOMEN - 1 VIEW  Comparison: 11/10/2011  Findings: Nasogastric tube seems to be coiled in the vicinity of the stomach, only a small portion of the tube is included on today's exam.  Abnormal dilated loops of small bowel measure up to 6.6 cm in diameter, mildly increased compared to yesterday's exam.  There appears to be left over contrast medium from the CT scan of 11/06/2011 in the colon.  Lumbar spondylosis is present.  A right hip gamma nail is noted.  Vascular calcifications are present.  IMPRESSION:  1.  Increasingly dilated loops of small bowel, possibly reflecting small bowel obstruction.  Nasogastric tube is not well  visualized, but coiled segments of the NG tube project over the stomach. 2.  Lumbar spondylosis.  Original Report Authenticated By: Dellia Cloud, M.D.   Dg Chest Port 1 View  11/12/2011  *RADIOLOGY REPORT*  Clinical Data: Decreased oxygen saturation  PORTABLE CHEST - 1 VIEW  Comparison: Portable chest x-ray of 11/11/2011  Findings: Cardiomegaly is stable.  Again there is a question of a small left pleural effusion.  There may be minimal pulmonary vascular congestion present.  Permanent pacemaker remains.  NG tube is in better position.  IMPRESSION: Stable cardiomegaly with probable small left effusion.  Question mild pulmonary vascular congestion.  Original Report Authenticated By: Juline Patch, M.D.   Dg Chest Port 1 View  11/11/2011  *RADIOLOGY REPORT*  Clinical Data: Preoperative for exploratory laparotomy.  PORTABLE CHEST - 1 VIEW  Comparison: 07/13/2011  Findings: Stable three lead pacer noted.  A nasogastric tube extends down to the stomach, loops, and extends back up into the mid-esophagus.  Thoracic spondylosis noted with cardiomegaly.  There is mild blunting left costophrenic angle, potentially reflecting small pleural effusion.  Atherosclerotic aortic arch noted.  Low lung volumes are present.  IMPRESSION:  1.  Nasogastric tube coils once in the stomach and extends back up into the mid esophagus. 2.  Cardiomegaly. 3.  Thoracic spondylosis. 4.  Atherosclerosis. 5.  Blunted left costophrenic angle could reflect a trace left pleural effusion.  Original Report Authenticated By: Soyla Murphy.  Ova Freshwater, M.D.    Anti-infectives: Anti-infectives     Start     Dose/Rate Route Frequency Ordered Stop   11/11/11 0818   ertapenem (INVANZ) 1 g in sodium chloride 0.9 % 50 mL IVPB        1 g 100 mL/hr over 30 Minutes Intravenous 60 min pre-op 11/11/11 0818 11/11/11 1346          Assessment/Plan: s/p Procedure(s) (LRB): EXPLORATORY LAPAROTOMY (N/A) SMALL BOWEL RESECTION (N/A) HERNIA REPAIR  INGUINAL ADULT (Left)  D/C NG Transfer to tele bed Leave foley for now secondary to deconditioning, weakness, strict I and O's PT/OT consult  LOS: 7 days    Steven Veazie A 11/13/2011

## 2011-11-13 NOTE — Progress Notes (Signed)
Subjective:   More alert today.  Feels fairly well.  Pain under good control.  NG out. l. Not SOB today.  No Chest pain. Objective:  Vital Signs in the last 24 hours: BP 143/45  Pulse 79  Temp(Src) 97.5 F (36.4 C) (Oral)  Resp 16  Ht 4\' 9"  (1.448 m)  Wt 56.7 kg (125 lb)  BMI 27.05 kg/m2  SpO2 99%  Physical Exam: Elderly WF in NAD  Lungs:  Clear Cardiac:  Regular rhythm, normal S1 and S2, no S3 Extremities:  1+ edema present  Intake/Output from previous day: 03/20 0701 - 03/21 0700 In: 1968.9 [P.O.:40; I.V.:1808.9; NG/GT:120] Out: 1120 [Urine:870; Emesis/NG output:250]  Lab Results: Basic Metabolic Panel:  Basename 11/13/11 0313 11/12/11 0345  NA 149* 146*  K 3.2* 3.6  CL 116* 117*  CO2 23 20  GLUCOSE 90 88  BUN 20 25*  CREATININE 0.54 0.62    CBC:  Basename 11/13/11 0313 11/12/11 0345  WBC 9.7 9.4  NEUTROABS -- --  HGB 9.1* 11.0*  HCT 29.0* 34.2*  MCV 101.0* 101.2*  PLT 150 167    Telemetry: Sinus with paced rhythm.     Assessment/Plan:  1. Cardiac stable post op exploratory lap 2  Cardiomyopathy 3. Hypokalemia  Rec:  Continue IV beta blocker and advance surgical course.  Resume oral cardiac meds when able to take po. Replete potassium.     Darden Palmer  MD [facility] Cardiology  11/13/2011, 9:08 AM

## 2011-11-14 LAB — GLUCOSE, CAPILLARY
Glucose-Capillary: 105 mg/dL — ABNORMAL HIGH (ref 70–99)
Glucose-Capillary: 116 mg/dL — ABNORMAL HIGH (ref 70–99)
Glucose-Capillary: 127 mg/dL — ABNORMAL HIGH (ref 70–99)

## 2011-11-14 LAB — BASIC METABOLIC PANEL
Calcium: 8.4 mg/dL (ref 8.4–10.5)
Chloride: 113 mEq/L — ABNORMAL HIGH (ref 96–112)
Creatinine, Ser: 0.42 mg/dL — ABNORMAL LOW (ref 0.50–1.10)
GFR calc Af Amer: >90 mL/min (ref >90)
Sodium: 147 mEq/L — ABNORMAL HIGH (ref 135–145)

## 2011-11-14 MED ORDER — BOOST / RESOURCE BREEZE PO LIQD
1.0000 | Freq: Two times a day (BID) | ORAL | Status: DC
  Administered 2011-11-14 – 2011-11-18 (×8): 1 via ORAL

## 2011-11-14 MED ORDER — CARVEDILOL 6.25 MG PO TABS
6.2500 mg | ORAL_TABLET | Freq: Two times a day (BID) | ORAL | Status: DC
  Administered 2011-11-14 – 2011-11-18 (×9): 6.25 mg via ORAL
  Filled 2011-11-14 (×12): qty 1

## 2011-11-14 NOTE — Progress Notes (Signed)
3 Days Post-Op  Subjective: Comfortable.  No nausea. Passing flatus  Objective: Vital signs in last 24 hours: Temp:  [98 F (36.7 C)-98.6 F (37 C)] 98.6 F (37 C) (03/22 0431) Pulse Rate:  [79-87] 82  (03/22 0431) Resp:  [13-20] 18  (03/22 0431) BP: (128-164)/(44-74) 155/71 mmHg (03/22 0431) SpO2:  [93 %-98 %] 97 % (03/22 0431) Weight:  [129 lb 10.1 oz (58.8 kg)] 129 lb 10.1 oz (58.8 kg) (03/22 0431) Last BM Date: 11/10/11  Intake/Output from previous day: 03/21 0701 - 03/22 0700 In: 1023.3 [P.O.:120; I.V.:893.3; IV Piggyback:10] Out: 1490 [Urine:1460; Emesis/NG output:30] Intake/Output this shift:    Abdomen soft, non distended. Incision clean. I removed wics  Lab Results:   Basename 11/13/11 0313 11/12/11 0345  WBC 9.7 9.4  HGB 9.1* 11.0*  HCT 29.0* 34.2*  PLT 150 167   BMET  Basename 11/14/11 0602 11/13/11 0313  NA 147* 149*  K 3.0* 3.2*  CL 113* 116*  CO2 26 23  GLUCOSE 140* 90  BUN 13 20  CREATININE 0.42* 0.54  CALCIUM 8.4 8.4   PT/INR No results found for this basename: LABPROT:2,INR:2 in the last 72 hours ABG  Basename 11/12/11 0556  PHART 7.327*  HCO3 20.4    Studies/Results: No results found.  Anti-infectives: Anti-infectives     Start     Dose/Rate Route Frequency Ordered Stop   11/11/11 0818   ertapenem (INVANZ) 1 g in sodium chloride 0.9 % 50 mL IVPB        1 g 100 mL/hr over 30 Minutes Intravenous 60 min pre-op 11/11/11 0818 11/11/11 1346          Assessment/Plan: s/p Procedure(s) (LRB): EXPLORATORY LAPAROTOMY (N/A) SMALL BOWEL RESECTION (N/A) HERNIA REPAIR INGUINAL ADULT (Left)  Advance po PT/OT  LOS: 8 days    [name] A 11/14/2011

## 2011-11-14 NOTE — Progress Notes (Signed)
Subjective:   Now on floor.  No SOB or chest pain.  Passing flatus, able to eat some now.  Objective:  Vital Signs in the last 24 hours: BP 155/71  Pulse 82  Temp(Src) 98.6 F (37 C) (Oral)  Resp 18  Ht 4\' 9"  (1.448 m)  Wt 58.8 kg (129 lb 10.1 oz)  BMI 28.05 kg/m2  SpO2 97%  Physical Exam: Elderly WF in NAD  Lungs:  Clear Cardiac:  Regular rhythm, normal S1 and S2, no S3 Extremities:  1+ edema present  Intake/Output from previous day: 03/21 0701 - 03/22 0700 In: 1023.3 [P.O.:120; I.V.:893.3; IV Piggyback:10] Out: 1490 [Urine:1460; Emesis/NG output:30]  Lab Results: Basic Metabolic Panel:  Basename 11/14/11 0602 11/13/11 0313  NA 147* 149*  K 3.0* 3.2*  CL 113* 116*  CO2 26 23  GLUCOSE 140* 90  BUN 13 20  CREATININE 0.42* 0.54    CBC:  Basename 11/13/11 0313 11/12/11 0345  WBC 9.7 9.4  NEUTROABS -- --  HGB 9.1* 11.0*  HCT 29.0* 34.2*  MCV 101.0* 101.2*  PLT 150 167    Telemetry: Sinus with paced rhythm.     Assessment/Plan:  1. Cardiac stable post op exploratory lap 2  Cardiomyopathy 3. Hypokalemia  Rec:  Go ahead and restart beta blocker orally.  Will restart other card meds as her po intake improves.   Briana Palmer  MD [facility] Cardiology  11/14/2011, 9:11 AM

## 2011-11-14 NOTE — Evaluation (Signed)
Occupational Therapy Evaluation Patient Details Name: [name] MRN: [id number] DOB: January 27, 1920 Today's Date: 11/14/2011  Problem List:  Patient Active Problem List  Diagnoses  . Biventricular pacemake  . Chronic systolic congestive heart failure  . Aortic valve regurgitation  . Hyperlipidemia  . Mitral valve disorder  . Dyspnea  . Anxiety  . Depression  . Aneurysm of abdominal aorta  . Hypertension  . Small bowel obstruction  . Mild dehydration    Past Medical History:  Past Medical History  Diagnosis Date  . Aortic valve regurgitation   . Left bundle branch block   . Hyperlipidemia   . Mitral valve disorder   . Pacemaker     For complete heart block, s/p generator change 09/2010   . Dyspnea   . Anxiety   . Depression   . Aneurysm of abdominal aorta     4.4 cm in 09/2010  . Closed intertrochanteric fracture of right hip 07/13/2011   Past Surgical History:  Past Surgical History  Procedure Date  . Replacement total knee 2008    left by Dr. Marciano Sequin  . Pacemaker insertion 2006    complete heart block  . Insert / replace / remove pacemaker 2012    upgrade to biventricular  . Femur im nail 07/14/2011    Procedure: INTRAMEDULLARY (IM) NAIL FEMORAL;  Surgeon: Eulas Post;  Location: [facility] OR;  Service: Orthopedics;  Laterality: Right;  Synthes TFN, fracture table, big C arm, available after 4p  . Left ingunial hernia   . Laparotomy 11/11/2011    Procedure: EXPLORATORY LAPAROTOMY;  Surgeon: Shelly Rubenstein, MD;  Location: [facility] OR;  Service: General;  Laterality: N/A;  . Bowel resection 11/11/2011    Procedure: SMALL BOWEL RESECTION;  Surgeon: Shelly Rubenstein, MD;  Location: [facility] OR;  Service: General;  Laterality: N/A;  . Inguinal hernia repair 11/11/2011    Procedure: HERNIA REPAIR INGUINAL ADULT;  Surgeon: Shelly Rubenstein, MD;  Location: [facility] OR;  Service: General;  Laterality: Left;    OT Assessment/Plan/Recommendation OT Assessment Clinical Impression  Statement: This 78 yo female s/p resection of SBO presents to acute OT with problems below thus affecting pt's PLOF. Will benefit from acute OT with follow-up at SNF to get back to PLOF. OT Recommendation/Assessment: Patient will need skilled OT in the acute care venue OT Problem List: Decreased strength;Decreased activity tolerance;Impaired balance (sitting and/or standing);Pain;Decreased knowledge of use of DME or AE Barriers to Discharge: Decreased caregiver support OT Therapy Diagnosis : Generalized weakness;Acute pain OT Plan OT Frequency: Min 2X/week OT Treatment/Interventions: Self-care/ADL training;DME and/or AE instruction;Therapeutic activities;Patient/family education;Balance training OT Recommendation Follow Up Recommendations: Skilled nursing facility Equipment Recommended: Defer to next venue Individuals Consulted Consulted and Agree with Results and Recommendations: Patient OT Goals Acute Rehab OT Goals OT Goal Formulation: With patient Time For Goal Achievement: 2 weeks ADL Goals Pt Will Perform Grooming: with min assist;Supported;Standing at sink (2 tasks) ADL Goal: Grooming - Progress: Goal set today Pt Will Perform Upper Body Bathing: with set-up;with supervision;Unsupported;Sit to stand from bed ADL Goal: Upper Body Bathing - Progress: Goal set today Pt Will Perform Lower Body Bathing: with mod assist;Supported;Sit to stand from bed ADL Goal: Lower Body Bathing - Progress: Goal set today Pt Will Perform Upper Body Dressing: with set-up;with supervision;Unsupported;Sitting, bed ADL Goal: Upper Body Dressing - Progress: Goal set today Pt Will Perform Lower Body Dressing: with mod assist;Supported;Sit to stand from bed ADL Goal: Lower Body Dressing - Progress: Goal set today Pt Will  Transfer to Toilet: with min assist;with DME;Ambulation;3-in-1 (in bathroom) ADL Goal: Toilet Transfer - Progress: Goal set today Pt Will Perform Toileting - Clothing Manipulation: with min  assist;Standing ADL Goal: Toileting - Clothing Manipulation - Progress: Goal set today Pt Will Perform Toileting - Hygiene: with min assist;Sit to stand from 3-in-1/toilet ADL Goal: Toileting - Hygiene - Progress: Goal set today Additional ADL Goal #1: Pt wil be S for OOB and in bed for BADLs ADL Goal: Additional Goal #1 - Progress: Goal set today  OT Evaluation Precautions/Restrictions  Precautions Precautions: Fall Required Braces or Orthoses: No Restrictions Weight Bearing Restrictions: No Prior Functioning Home Living Lives With: Spouse (spouse currently at SNF, pt has 24hr caregivers at times) Dolores Lory Help From: Personal care attendant Type of Home: House Home Layout: One level Home Access: Stairs to enter;Ramped entrance Entrance Stairs-Number of Steps: 2 Bathroom Shower/Tub: Psychologist, counselling;Door [employer] Toilet: Standard Bathroom Accessibility: Yes How Accessible: Accessible via walker Home Adaptive Equipment: Walker - four wheeled;Straight cane;Bedside commode/3-in-1;Built-in shower seat Prior Function Level of Independence: Independent with transfers;Requires assistive device for independence;Independent with gait;Needs assistance with ADLs;Needs assistance with homemaking Bath: Moderate Dressing: Moderate Meal Prep: Total Light Housekeeping: Total Driving: No Vocation: Retired ADL ADL Eating/Feeding: Simulated;Set up;Supervision/safety Where Assessed - Eating/Feeding: Bed level Grooming: Simulated;Supervision/safety;Set up Where Assessed - Grooming: Supine, head of bed up;Supported Upper Body Bathing: Simulated;Supervision/safety;Set up Where Assessed - Upper Body Bathing: Supine, head of bed up;Supported Lower Body Bathing: Maximal assistance Where Assessed - Lower Body Bathing: Supported;Supine, head of bed up;Sit to stand from bed Upper Body Dressing: Simulated;Moderate assistance Where Assessed - Upper Body Dressing: Supported;Sitting, bed Lower Body  Dressing: Simulated;+1 Total assistance Where Assessed - Lower Body Dressing: Sit to stand from bed;Supported Toilet Transfer: Simulated;Moderate assistance;Other (comment) (Bed to chair going to pt's right) Toilet Transfer Method: Stand pivot Toileting - Clothing Manipulation: Simulated;+1 Total assistance Where Assessed - Toileting Clothing Manipulation: Standing Toileting - Hygiene: Simulated;+1 Total assistance Where Assessed - Toileting Hygiene: Standing Tub/Shower Transfer: Not assessed Tub/Shower Transfer Method: Not assessed Equipment Used: Wheelchair (gait belt) Ambulation Related to ADLs: Unable today only stand pivot Vision/Perception    Cognition Cognition Arousal/Alertness: Awake/alert Overall Cognitive Status: Appears within functional limits for tasks assessed Orientation Level: Oriented X4 Sensation/Coordination   Extremity Assessment RUE Assessment RUE Assessment:  (grossly 3/5) LUE Assessment LUE Assessment:  (grossly 3/5) Mobility  Bed Mobility Bed Mobility: Yes Supine to Sit: 3: Mod assist;HOB elevated (Comment degrees);With rails (30 degrees) Sitting - Scoot to Edge of Bed: 4: Min assist;With rail Transfers Transfers: Yes Sit to Stand: 3: Mod assist;From bed;With upper extremity assist Stand to Sit: 2: Max assist;To chair/3-in-1;With upper extremity assist;With armrests Stand to Sit Details: control for descent needed Exercises   End of Session OT - End of Session Equipment Utilized During Treatment: Gait belt Activity Tolerance: Patient limited by fatigue;Patient limited by pain Patient left: in chair;with call bell in reach General Behavior During Session: [facility] for tasks performed Cognition: [facility] for tasks performed   Evette Georges [phone] 11/14/2011, 11:45 AM

## 2011-11-14 NOTE — Progress Notes (Signed)
Nutrition Follow-up  Diet Order:  Clear liquids S/P Exploratory laparotomy, small bowel resection and hernia repair on 3/19. Per sx notes: pt had closed loop obstruction with adhesive band, 1 foot of small bowel ischemic r/t stricture. Approximately 1 foot of small bowel was removed.  This AM diet was advanced to clear liquids. At time of RD visit patient had not eaten any of her CL tray. RD encouraged patient to try her juice, pt drank about 1/2 of her juice while RD in room.  Seemed to tolerate it fine. No other complaints at this time.  Meds: Scheduled Meds:   . carvedilol  6.25 mg Oral BID WC  . chlorhexidine  15 mL Mouth/Throat QID  . heparin  5,000 Units Subcutaneous Q8H  . pantoprazole (PROTONIX) IV  40 mg Intravenous QHS  . potassium chloride  10 mEq Intravenous Q1 Hr x 3  . DISCONTD: metoprolol  5 mg Intravenous Q6H   Continuous Infusions:   . dextrose 5 % and 0.9 % NaCl with KCl 20 mEq/L 50 mL/hr at 11/14/11 0723   PRN Meds:.acetaminophen, hydrALAZINE, morphine injection, ondansetron (ZOFRAN) IV, ondansetron, phenol, DISCONTD: acetaminophen  Labs:  CMP     Component Value Date/Time   NA 147* 11/14/2011 0602   K 3.0* 11/14/2011 0602   CL 113* 11/14/2011 0602   CO2 26 11/14/2011 0602   GLUCOSE 140* 11/14/2011 0602   BUN 13 11/14/2011 0602   CREATININE 0.42* 11/14/2011 0602   CALCIUM 8.4 11/14/2011 0602   PROT 6.8 11/06/2011 1559   ALBUMIN 3.7 11/06/2011 1559   AST 25 11/06/2011 1559   ALT 11 11/06/2011 1559   ALKPHOS 83 11/06/2011 1559   BILITOT 0.5 11/06/2011 1559   GFRNONAA 88* 11/14/2011 0602   GFRAA >90 11/14/2011 0602     Intake/Output Summary (Last 24 hours) at 11/14/11 0937 Last data filed at 11/14/11 0758  Gross per 24 hour  Intake 1003.33 ml  Output   1460 ml  Net -456.67 ml    Weight Status:  129 lbs, weight has been trending up since admission, likely related to fluids as patient has been NPO since 3/14.  Re-estimated needs:  1350-1600 kcal, 65-75 gm  protein  Nutrition Dx:  Inadequate oral intake now r/t limited appetite AEB sips of clear liquids  Goal:  Diet will advance w/i 5 days, unmet. New Goal: Diet will continue to advance and patient will be able to meet needs PO.   Intervention:   1.  Add Resource Breeze BID to increase calorie and protein intake while on CL diet  2. RD will continue to monitor  Monitor:  PO intake, diet, weight, labs   Rudean Haskell Pager #:  [phone]

## 2011-11-15 DIAGNOSIS — I5022 Chronic systolic (congestive) heart failure: Secondary | ICD-10-CM

## 2011-11-15 DIAGNOSIS — I509 Heart failure, unspecified: Secondary | ICD-10-CM

## 2011-11-15 DIAGNOSIS — K56609 Unspecified intestinal obstruction, unspecified as to partial versus complete obstruction: Secondary | ICD-10-CM

## 2011-11-15 LAB — GLUCOSE, CAPILLARY: Glucose-Capillary: 133 mg/dL — ABNORMAL HIGH (ref 70–99)

## 2011-11-15 NOTE — Progress Notes (Signed)
4 Days Post-Op  Subjective: Passing lots of gas, no BM yet. Taking clear liquids. Minimal pain, no nausea  Objective: Vital signs in last 24 hours: Temp:  [97.4 F (36.3 C)-99.4 F (37.4 C)] 97.4 F (36.3 C) (03/23 0452) Pulse Rate:  [70-78] 70  (03/23 0452) Resp:  [18-20] 20  (03/23 0452) BP: (121-151)/(43-67) 144/47 mmHg (03/23 0452) SpO2:  [97 %] 97 % (03/23 0452) Weight:  [125 lb 3.5 oz (56.8 kg)] 125 lb 3.5 oz (56.8 kg) (03/23 0452)   Intake/Output from previous day: 03/22 0701 - 03/23 0700 In: 2207.5 [P.O.:1050; I.V.:1157.5] Out: 150 [Urine:150] Intake/Output this shift: Total I/O In: 220 [P.O.:220] Out: -    General appearance: alert, appears stated age, fatigued and no distress GI: Abdomen soft, minimally distended, not tender, BS+  Incision: DDI  Lab Results:   [facility] 11/13/11 0313  WBC 9.7  HGB 9.1*  HCT 29.0*  PLT 150   BMET  Basename 11/14/11 0602 11/13/11 0313  NA 147* 149*  K 3.0* 3.2*  CL 113* 116*  CO2 26 23  GLUCOSE 140* 90  BUN 13 20  CREATININE 0.42* 0.54  CALCIUM 8.4 8.4   PT/INR No results found for this basename: LABPROT:2,INR:2 in the last 72 hours ABG No results found for this basename: PHART:2,PCO2:2,PO2:2,HCO3:2 in the last 72 hours  MEDS, Scheduled    . carvedilol  6.25 mg Oral BID WC  . chlorhexidine  15 mL Mouth/Throat QID  . feeding supplement  1 Container Oral BID AC  . heparin  5,000 Units Subcutaneous Q8H  . pantoprazole (PROTONIX) IV  40 mg Intravenous QHS    Studies/Results: No results found.  Assessment: s/p Procedure(s): EXPLORATORY LAPAROTOMY SMALL BOWEL RESECTION HERNIA REPAIR INGUINAL ADULT Stable  Plan: Advance diet   LOS: 9 days     Currie Paris, MD, [facility] Surgery, [place] [phone]   11/15/2011 12:45 PM

## 2011-11-15 NOTE — Progress Notes (Signed)
Subjective:   Now on floor.  No SOB or chest pain.  Passing flatus, able to eat some now. Rhythm stable with V-pacing.   Objective:  Vital Signs in the last 24 hours: BP 144/47  Pulse 70  Temp(Src) 97.4 F (36.3 C) (Oral)  Resp 20  Ht 4\' 9"  (1.448 m)  Wt 56.8 kg (125 lb 3.5 oz)  BMI 27.10 kg/m2  SpO2 97%  Physical Exam: Elderly WF in NAD  Lungs:  Clear Cardiac:  Regular rhythm, normal S1 and S2, no S3 Extremities:  1+ edema present  Intake/Output from previous day: 03/22 0701 - 03/23 0700 In: 2207.5 [P.O.:1050; I.V.:1157.5] Out: 150 [Urine:150]  Lab Results: Basic Metabolic Panel:  Basename 11/14/11 0602 11/13/11 0313  NA 147* 149*  K 3.0* 3.2*  CL 113* 116*  CO2 26 23  GLUCOSE 140* 90  BUN 13 20  CREATININE 0.42* 0.54    CBC:  Basename 11/13/11 0313  WBC 9.7  NEUTROABS --  HGB 9.1*  HCT 29.0*  MCV 101.0*  PLT 150    Telemetry: Sinus with paced rhythm.     Assessment/Plan:  1. Cardiac stable post op exploratory lap 2  Cardiomyopathy 3. Hypokalemia  Rec:  Tolerating oral beta blocker. Potassium repletion per primary service.   Cassell Clement   11/15/2011, 10:15 AM

## 2011-11-16 NOTE — Progress Notes (Signed)
5 Days Post-Op  Subjective: Having bm, tol fulls, no other complaints  Objective: Vital signs in last 24 hours: Temp:  [97.2 F (36.2 C)-99.1 F (37.3 C)] 99 F (37.2 C) (03/24 0500) Pulse Rate:  [76-87] 86  (03/24 0500) Resp:  [18] 18  (03/24 0500) BP: (118-128)/(49-64) 126/64 mmHg (03/24 0500) SpO2:  [96 %-97 %] 96 % (03/24 0500) Weight:  [126 lb 5.2 oz (57.3 kg)] 126 lb 5.2 oz (57.3 kg) (03/24 0243) Last BM Date: 11/15/11  Intake/Output from previous day: 03/23 0701 - 03/24 0700 In: 1730 [P.O.:580; I.V.:1150] Out: 376 [Urine:375; Stool:1] Intake/Output this shift:    General appearance: no distress Resp: clear to auscultation bilaterally Cardio: regular rate and rhythm GI: soft, wound clean, bs present, appropr tender   Assessment/Plan: POD #5 ex lap , sbr - pulm toilet -she wants to stay on fulls today but appears she is resolving her ileus    LOS: 10 days    [facility] 11/16/2011

## 2011-11-16 NOTE — Progress Notes (Signed)
Subjective:     No SOB or chest pain.  Passing flatus, able to eat some now. Still on liquids. Rhythm stable with V-pacing.   Objective:  Vital Signs in the last 24 hours: BP 126/64  Pulse 86  Temp(Src) 99 F (37.2 C) (Axillary)  Resp 18  Ht 4\' 9"  (1.448 m)  Wt 57.3 kg (126 lb 5.2 oz)  BMI 27.34 kg/m2  SpO2 96%  Physical Exam: Elderly WF in NAD  Lungs:  Clear Cardiac:  Regular rhythm, normal S1 and S2, no S3 Extremities:  1+ edema present  Intake/Output from previous day: 03/23 0701 - 03/24 0700 In: 1730 [P.O.:580; I.V.:1150] Out: 376 [Urine:375; Stool:1]  Lab Results: Basic Metabolic Panel:  Basename 11/14/11 0602  NA 147*  K 3.0*  CL 113*  CO2 26  GLUCOSE 140*  BUN 13  CREATININE 0.42*    CBC: No results found for this basename: WBC:2,NEUTROABS:2,HGB:2,HCT:2,MCV:2,PLT:2 in the last 72 hours  Telemetry: Sinus with paced rhythm.     Assessment/Plan:  1. Cardiac stable post op exploratory lap 2  Cardiomyopathy 3. Hypokalemia  Rec:  Tolerating oral beta blocker. Recheck BMET in am re hypokalemia   Cassell Clement   11/16/2011, 11:32 AM

## 2011-11-17 LAB — BASIC METABOLIC PANEL
CO2: 29 mEq/L (ref 19–32)
Chloride: 108 mEq/L (ref 96–112)
GFR calc Af Amer: >90 mL/min (ref >90)
Potassium: 3.4 mEq/L — ABNORMAL LOW (ref 3.5–5.1)

## 2011-11-17 MED ORDER — PANTOPRAZOLE SODIUM 40 MG PO TBEC
40.0000 mg | DELAYED_RELEASE_TABLET | Freq: Every day | ORAL | Status: DC
  Administered 2011-11-17 – 2011-11-18 (×2): 40 mg via ORAL
  Filled 2011-11-17 (×2): qty 1

## 2011-11-17 MED ORDER — ACETAMINOPHEN 325 MG PO TABS: 650.0000 mg | ORAL_TABLET | ORAL | Status: DC | PRN

## 2011-11-17 MED ORDER — LOSARTAN POTASSIUM 50 MG PO TABS
50.0000 mg | ORAL_TABLET | Freq: Every day | ORAL | Status: DC
  Administered 2011-11-17 – 2011-11-18 (×2): 50 mg via ORAL
  Filled 2011-11-17 (×2): qty 1

## 2011-11-17 NOTE — Progress Notes (Signed)
Patient was standing up after having a BM on BSC and I was cleaning her.  While cleaning, I noticed the patient had a round, pink, something about and inch long hanging from her anus. I finished cleaning her, and then we put her back in the bed. After the patient had got back in the bed, the thing that was hanging was no longer there. I paged Dr. Johna Sheriff. He said it may have been the lining of her rectum, but since it had went back in on its own, he was not concerned. No no orders given, will continue to monitor.

## 2011-11-17 NOTE — Progress Notes (Signed)
Clinical Social Worker received notification from [facility] that they do have a bed available for pt.  Pt informed, FL2 in chart, MD please sign.    Angelia Mould, MSW, [place] [phone]

## 2011-11-17 NOTE — Progress Notes (Signed)
Subjective:   No SOB or chest pain.  Eating reg food, some diarrhea and urinary frequency. Objective:  Vital Signs in the last 24 hours: BP 129/69  Pulse 78  Temp(Src) 98.3 F (36.8 C) (Oral)  Resp 19  Ht 4\' 9"  (1.448 m)  Wt 59.4 kg (130 lb 15.3 oz)  BMI 28.34 kg/m2  SpO2 95%  Physical Exam: Elderly WF in NAD  Lungs:  Clear Cardiac:  Regular rhythm, normal S1 and S2, no S3 Extremities:  1+ edema present  Intake/Output from previous day: 03/24 0701 - 03/25 0700 In: 340 [P.O.:340] Out: 826 [Urine:825; Stool:1]  Lab Results: Basic Metabolic Panel:  Basename 11/17/11 0547  NA 142  K 3.4*  CL 108  CO2 29  GLUCOSE 101*  BUN 9  CREATININE 0.43*   Telemetry: Sinus with paced rhythm.     Assessment/Plan:  1. Cardiac stable post op exploratory lap 2  Cardiomyopathy 3. Hypokalemia  Rec:  Would like to restart other home meds gradually.   Darden Palmer  MD [facility]  11/17/2011, 1:42 PM

## 2011-11-17 NOTE — Progress Notes (Signed)
Clinical Social Work Department BRIEF PSYCHOSOCIAL ASSESSMENT 11/17/2011  Patient:  [name], [name]     Account Number:  [id number]     Admit date:  11/06/2011  Clinical Social Worker:  Margaree Mackintosh  Date/Time:  11/17/2011 02:26 PM  Referred by:  Physician  Date Referred:  11/17/2011 Referred for  SNF Placement   Other Referral:   Interview type:  Patient Other interview type:   Home Health Aide in Room    PSYCHOSOCIAL DATA Living Status:  FAMILY Admitted from facility:   Level of care:   Primary support name:  [phone] Primary support relationship to patient:  CHILD, ADULT Degree of support available:   adequate    CURRENT CONCERNS Current Concerns  Post-Acute Placement   Other Concerns:    SOCIAL WORK ASSESSMENT / PLAN Clinical Engineer, manufacturing met with pt in room, with In home Aide at pt's bedside.  CSW introduced and explained role. CSW offered support and opportunity for pt to process difficult feelings.  CSW confirmed pt's plan for SNF at dc for rehab.  Pt shared that her spouse is currently recieving rehab at [facility] and pt is interested in joining her spouse at the same facility.  CSW contacted facility and submitted appropriate paperwork.  CSW to continue to follow and assist as needed.   Assessment/plan status:  Information/Referral to [facility] Other assessment/ plan:   Information/referral to community resources:   SNF    PATIENT'S/FAMILY'S RESPONSE TO PLAN OF CARE: Pt was pleasant and appororpiately engaged.  Pt appeared to be open and honest in commnication.  Pt thanked CSW for intervention.       Angelia Mould, MSW, [place] [phone]

## 2011-11-17 NOTE — Progress Notes (Signed)
Patient ID: [name], female   DOB: November 19, 1919, 78 y.o.   MRN: [id number] 6 Days Post-Op  Subjective: Large incontinent stool. Tol FL.  Denies nausea or abd pain  Objective: Vital signs in last 24 hours: Temp:  [97.6 F (36.4 C)-98.3 F (36.8 C)] 98.3 F (36.8 C) (03/25 0535) Pulse Rate:  [68-73] 73  (03/25 0535) Resp:  [18-19] 19  (03/25 0535) BP: (129-163)/(47-72) 129/69 mmHg (03/25 0535) SpO2:  [98 %-99 %] 98 % (03/25 0535) Weight:  [130 lb 15.3 oz (59.4 kg)] 130 lb 15.3 oz (59.4 kg) (03/25 0535) Last BM Date: 11/16/11  Intake/Output from previous day: 03/24 0701 - 03/25 0700 In: 340 [P.O.:340] Out: 826 [Urine:825; Stool:1] Intake/Output this shift: Total I/O In: 300 [P.O.:300] Out: 250 [Urine:250]  General appearance: alert and no distress GI: normal findings: soft, non-tender Incision/Wound: clean without infection  Lab Results:  No results found for this basename: WBC:2,HGB:2,HCT:2,PLT:2 in the last 72 hours BMET  [facility] 11/17/11 0547  NA 142  K 3.4*  CL 108  CO2 29  GLUCOSE 101*  BUN 9  CREATININE 0.43*  CALCIUM 8.1*     Studies/Results: No results found.  Anti-infectives: Anti-infectives     Start     Dose/Rate Route Frequency Ordered Stop   11/11/11 0818   ertapenem (INVANZ) 1 g in sodium chloride 0.9 % 50 mL IVPB        1 g 100 mL/hr over 30 Minutes Intravenous 60 min pre-op 11/11/11 0818 11/11/11 1346          Assessment/Plan: s/p Procedure(s): EXPLORATORY LAPAROTOMY SMALL BOWEL RESECTION HERNIA REPAIR INGUINAL ADULT Doing well Advance diet.  PT/OT   LOS: 11 days    Ragnar Waas T 11/17/2011

## 2011-11-17 NOTE — Progress Notes (Signed)
Physical Therapy Treatment Patient Details Name: [name] MRN: [id number] DOB: 1920-08-12 Today's Date: 11/17/2011  PT Assessment/Plan  PT - Assessment/Plan Comments on Treatment Session: Treatment limited by mucousy stools every time pt exerted effort to stand PT Plan: Discharge plan remains appropriate Follow Up Recommendations: Skilled nursing facility;Home health PT;Supervision for mobility/OOB Equipment Recommended: Defer to next venue PT Goals  Acute Rehab PT Goals PT Goal Formulation: With patient PT Goal: Supine/Side to Sit - Progress: Progressing toward goal PT Goal: Sit to Supine/Side - Progress: Progressing toward goal PT Goal: Sit to Stand - Progress: Progressing toward goal PT Goal: Stand to Sit - Progress: Progressing toward goal  PT Treatment Precautions/Restrictions  Precautions Precautions: Fall Required Braces or Orthoses: No Restrictions Weight Bearing Restrictions: No Mobility (including Balance) Bed Mobility Supine to Sit: 3: Mod assist;Patient percentage (comment);HOB elevated (Comment degrees);Other (comment) (pt = 70%; HOB 20 degrees) Supine to Sit Details (indicate cue type and reason): vc for safe technique for UE assist; assist to come forward Transfers Transfers: Yes Sit to Stand: From bed;From chair/3-in-1;From elevated surface;Without upper extremity assist;4: Min assist ((times 3) with standing periods at 3 in 1 for pericare) Sit to Stand Details (indicate cue type and reason): vc's for hand placement; assist coming forward over BOS Stand to Sit: 4: Min assist;Other (comment);To chair/3-in-1 Stand Pivot Transfers: 4: Min assist;From elevated surface Stand Pivot Transfer Details (indicate cue type and reason): with RW; assist maneuvering the RW and mild stability assist Ambulation/Gait Ambulation/Gait: No Stairs: No  Posture/Postural Control Posture/Postural Control: No significant limitations Balance Balance Assessed: No Exercise      End of Session PT - End of Session Activity Tolerance: Patient tolerated treatment well Patient left: in chair;with call bell in reach;with family/visitor present Nurse Communication: Mobility status for transfers;Mobility status for ambulation General Behavior During Session: [facility] for tasks performed Cognition: [facility] for tasks performed  Caron Tardif, Eliseo Gum 11/17/2011, 1:07 PM  11/17/2011  Riley Bing, PT [phone] (pager)

## 2011-11-17 NOTE — Progress Notes (Signed)
Occupational Therapy Treatment Patient Details Name: [name] MRN: [id number] DOB: [birth year] Today's Date: 11/17/2011  OT Assessment/Plan OT Assessment/Plan Comments on Treatment Session: Pt. treatment limited due to loose stools. Pt's caregiver present and reports will get disposable briefs for pt. to wear to increase functional mobility OT Plan: Discharge plan remains appropriate OT Frequency: Min 2X/week Follow Up Recommendations: Skilled nursing facility Equipment Recommended: Defer to next venue OT Goals Acute Rehab OT Goals OT Goal Formulation: With patient Time For Goal Achievement: 2 weeks ADL Goals Pt Will Perform Grooming: with min assist;Supported;Standing at sink ADL Goal: Grooming - Progress: Progressing toward goals Pt Will Transfer to Toilet: with min assist;with DME;Ambulation;3-in-1 ADL Goal: Toilet Transfer - Progress: Met Pt Will Perform Toileting - Clothing Manipulation: with min assist;Standing ADL Goal: Toileting - Clothing Manipulation - Progress: Progressing toward goals Pt Will Perform Toileting - Hygiene: with min assist;Sit to stand from 3-in-1/toilet ADL Goal: Toileting - Hygiene - Progress: Progressing toward goals Additional ADL Goal #1: Pt wil be S for OOB and in bed for BADLs ADL Goal: Additional Goal #1 - Progress: Progressing toward goals  OT Treatment Precautions/Restrictions  Precautions Precautions: Fall Required Braces or Orthoses: No Restrictions Weight Bearing Restrictions: No   ADL ADL Grooming: Performed;Wash/dry face;Wash/dry hands;Set up;Supervision/safety Where Assessed - Grooming: Standing at sink Upper Body Dressing: Performed;Set up Upper Body Dressing Details (indicate cue type and reason): With donning gown Where Assessed - Upper Body Dressing: Supported;Sitting, bed Toilet Transfer: Performed;Moderate assistance Toilet Transfer Details (indicate cue type and reason): Mod verbal cues for hand placement and  technique Toilet Transfer Method: Stand pivot Toilet Transfer Equipment: Bedside commode Toileting - Clothing Manipulation: +1 Total assistance;Performed Toileting - Clothing Manipulation Details (indicate cue type and reason): Assist for gown Where Assessed - Glass blower/designer Manipulation: Standing Toileting - Hygiene: Performed;+1 Total assistance Toileting - Hygiene Details (indicate cue type and reason): Pt. relying on bil UE support on RW  Where Assessed - Toileting Hygiene: Sit to stand from 3-in-1 or toilet Equipment Used: Rolling walker Ambulation Related to ADLs: NA d ADL Comments: Pt. treatment limited to loose stool and pt. very fatigued.  Mobility  Bed Mobility Bed Mobility: Yes Supine to Sit: 3: Mod assist;Patient percentage (comment);HOB elevated (Comment degrees);Other (comment) Supine to Sit Details (indicate cue type and reason): Mod verbal cues for safety of hand placement on bed rail and rechnique Transfers Sit to Stand: From bed;From chair/3-in-1;From elevated surface;Without upper extremity assist;4: Min assist Sit to Stand Details (indicate cue type and reason): Min verbal cues for upright posture  Stand to Sit: 4: Min assist;Other (comment);To chair/3-in-1 Exercises    End of Session OT - End of Session Equipment Utilized During Treatment: Gait belt Activity Tolerance: Patient limited by fatigue;Patient limited by pain Patient left: in chair;with call bell in reach General Behavior During Session: [facility] for tasks performed Cognition: [facility] for tasks performed  Kouper Spinella, OTR/L Pager [phone]  11/17/2011, 2:50 PM

## 2011-11-18 DIAGNOSIS — E785 Hyperlipidemia, unspecified: Secondary | ICD-10-CM | POA: Diagnosis not present

## 2011-11-18 DIAGNOSIS — L0291 Cutaneous abscess, unspecified: Secondary | ICD-10-CM | POA: Diagnosis not present

## 2011-11-18 DIAGNOSIS — R350 Frequency of micturition: Secondary | ICD-10-CM | POA: Diagnosis not present

## 2011-11-18 DIAGNOSIS — I509 Heart failure, unspecified: Secondary | ICD-10-CM | POA: Diagnosis not present

## 2011-11-18 DIAGNOSIS — M6281 Muscle weakness (generalized): Secondary | ICD-10-CM | POA: Diagnosis not present

## 2011-11-18 DIAGNOSIS — R109 Unspecified abdominal pain: Secondary | ICD-10-CM | POA: Diagnosis not present

## 2011-11-18 DIAGNOSIS — K4091 Unilateral inguinal hernia, without obstruction or gangrene, recurrent: Secondary | ICD-10-CM | POA: Diagnosis not present

## 2011-11-18 DIAGNOSIS — Z79899 Other long term (current) drug therapy: Secondary | ICD-10-CM | POA: Diagnosis not present

## 2011-11-18 DIAGNOSIS — F411 Generalized anxiety disorder: Secondary | ICD-10-CM | POA: Diagnosis not present

## 2011-11-18 DIAGNOSIS — I5022 Chronic systolic (congestive) heart failure: Secondary | ICD-10-CM | POA: Diagnosis not present

## 2011-11-18 DIAGNOSIS — N390 Urinary tract infection, site not specified: Secondary | ICD-10-CM | POA: Diagnosis not present

## 2011-11-18 DIAGNOSIS — K56609 Unspecified intestinal obstruction, unspecified as to partial versus complete obstruction: Secondary | ICD-10-CM | POA: Diagnosis not present

## 2011-11-18 DIAGNOSIS — Z4801 Encounter for change or removal of surgical wound dressing: Secondary | ICD-10-CM | POA: Diagnosis not present

## 2011-11-18 DIAGNOSIS — Z9889 Other specified postprocedural states: Secondary | ICD-10-CM | POA: Diagnosis not present

## 2011-11-18 DIAGNOSIS — I10 Essential (primary) hypertension: Secondary | ICD-10-CM | POA: Diagnosis not present

## 2011-11-18 DIAGNOSIS — K5669 Other intestinal obstruction: Secondary | ICD-10-CM | POA: Diagnosis not present

## 2011-11-18 DIAGNOSIS — R262 Difficulty in walking, not elsewhere classified: Secondary | ICD-10-CM | POA: Diagnosis not present

## 2011-11-18 DIAGNOSIS — M899 Disorder of bone, unspecified: Secondary | ICD-10-CM | POA: Diagnosis not present

## 2011-11-18 DIAGNOSIS — I080 Rheumatic disorders of both mitral and aortic valves: Secondary | ICD-10-CM | POA: Diagnosis not present

## 2011-11-18 DIAGNOSIS — F329 Major depressive disorder, single episode, unspecified: Secondary | ICD-10-CM | POA: Diagnosis not present

## 2011-11-18 DIAGNOSIS — Z5189 Encounter for other specified aftercare: Secondary | ICD-10-CM | POA: Diagnosis not present

## 2011-11-18 DIAGNOSIS — Z5331 Laparoscopic surgical procedure converted to open procedure: Secondary | ICD-10-CM | POA: Diagnosis not present

## 2011-11-18 MED ORDER — OXYCODONE HCL 5 MG PO CAPS: 5.0000 mg | ORAL_CAPSULE | ORAL | Status: DC | PRN

## 2011-11-18 NOTE — Discharge Instructions (Signed)
Please Remove staples at nursing facility on 11-25-11.  Please apply steri-strips after staples removed if available.  Place dry gauze dressing over abdominal wound daily.  [facility]      [facility], [place] [phone]  OPEN ABDOMINAL SURGERY: POST OP INSTRUCTIONS  Always review your discharge instruction sheet given to you by the facility where your surgery was performed.  IF YOU HAVE DISABILITY OR FAMILY LEAVE FORMS, YOU MUST BRING THEM TO THE OFFICE FOR PROCESSING.  PLEASE DO NOT GIVE THEM TO YOUR DOCTOR.  1. A prescription for pain medication may be given to you upon discharge.  Take your pain medication as prescribed, if needed.  If narcotic pain medicine is not needed, then you may take acetaminophen (Tylenol) or ibuprofen (Advil) as needed. 2. Take your usually prescribed medications unless otherwise directed. 3. If you need a refill on your pain medication, please contact your pharmacy. They will contact our office to request authorization.  Prescriptions will not be filled after 5pm or on week-ends. 4. You should follow a light diet the first few days after arrival home, such as soup and crackers, pudding, etc.unless your doctor has advised otherwise. A high-fiber, low fat diet can be resumed as tolerated.   Be sure to include lots of fluids daily. Most patients will experience some swelling and bruising on the chest and neck area.  Ice packs will help.  Swelling and bruising can take several days to resolve 5. Most patients will experience some swelling and bruising in the area of the incision. Ice pack will help. Swelling and bruising can take several days to resolve..  6. It is common to experience some constipation if taking pain medication after surgery.  Increasing fluid intake and taking a stool softener will usually help or prevent this problem from occurring.  A mild laxative (Milk of Magnesia or Miralax) should be taken according to package directions if there are no bowel  movements after 48 hours. 7.  You may have steri-strips (small skin tapes) in place directly over the incision.  These strips should be left on the skin for 7-10 days.  If your surgeon used skin glue on the incision, you may shower in 24 hours.  The glue will flake off over the next 2-3 weeks.  Any sutures or staples will be removed at the office during your follow-up visit. You may find that a light gauze bandage over your incision may keep your staples from being rubbed or pulled. You may shower and replace the bandage daily. 8. ACTIVITIES:  You may resume regular (light) daily activities beginning the next day--such as daily self-care, walking, climbing stairs--gradually increasing activities as tolerated.  You may have sexual intercourse when it is comfortable.  Refrain from any heavy lifting or straining until approved by your doctor. a. You may drive when you no longer are taking prescription pain medication, you can comfortably wear a seatbelt, and you can safely maneuver your car and apply brakes b. Return to Work: ___________________________________ 9. You should see your doctor in the office for a follow-up appointment approximately two weeks after your surgery.  Make sure that you call for this appointment within a day or two after you arrive home to insure a convenient appointment time. OTHER INSTRUCTIONS:  _____________________________________________________________ _____________________________________________________________  WHEN TO CALL YOUR DOCTOR: 1. Fever over 101.0 2. Inability to urinate 3. Nausea and/or vomiting 4. Extreme swelling or bruising 5. Continued bleeding from incision. 6. Increased pain, redness, or drainage from the incision. 7.  Difficulty swallowing or breathing 8. Muscle cramping or spasms. 9. Numbness or tingling in hands or feet or around lips.  The clinic staff is available to answer your questions during regular business hours.  Please don't hesitate to  call and ask to speak to one of the nurses if you have concerns.  For further questions, please visit [email or web address]

## 2011-11-18 NOTE — Progress Notes (Signed)
Ambulance is currently picking patient up for transport____________________________________D. Manson Passey RN

## 2011-11-18 NOTE — Progress Notes (Signed)
OT Cancellation Note  Treatment cancelled today due to pt. anticipates D/C shortly and declining OOB activity.Marland Kitchen  Brexley Cutshaw, OTR/L Pager [phone] 11/18/2011, 1:48 PM

## 2011-11-18 NOTE — Progress Notes (Signed)
Patient's tele and IV has been discontinued, patient and family members verbalizes understanding of discharge instructions.  Patient is awaiting ambulance to transport her to [facility] facility._______D. Manson Passey RN

## 2011-11-18 NOTE — Progress Notes (Signed)
Clinical Social Work Department CLINICAL SOCIAL WORK PLACEMENT NOTE 11/18/2011  Patient:  [name], [name]  Account Number:  [id number] Admit date:  11/06/2011  Clinical Social Worker:  Margaree Mackintosh  Date/time:  11/17/2011 12:00 M  Clinical Social Work is seeking post-discharge placement for this patient at the following level of care:   SKILLED NURSING   (*CSW will update this form in Epic as items are completed)   11/17/2011  Patient/family provided with [facility] Department of Clinical Social Work's list of facilities offering this level of care within the geographic area requested by the patient (or if unable, by the patient's family).  11/17/2011  Patient/family informed of their freedom to choose among providers that offer the needed level of care, that participate in Medicare, Medicaid or managed care program needed by the patient, have an available bed and are willing to accept the patient.  11/17/2011  Patient/family informed of [facility]' ownership interest in [facility], as well as of the fact that they are under no obligation to receive care at this facility.  PASARR submitted to EDS on 11/17/2011 PASARR number received from EDS on   FL2 transmitted to all facilities in geographic area requested by pt/family on  11/17/2011 FL2 transmitted to all facilities within larger geographic area on   Patient informed that his/her managed care company has contracts with or will negotiate with  certain facilities, including the following:     Patient/family informed of bed offers received:  11/17/2011 Patient chooses bed at [facility] Physician recommends and patient chooses bed at  [facility]  Patient to be transferred to [facility] on  11/18/2011 Patient to be transferred to facility by ptar  The following physician request were entered in Epic:   Additional Comments:     Angelia Mould, MSW, [employer] [phone]

## 2011-11-18 NOTE — Progress Notes (Signed)
Subjective:   No SOB or chest pain.  Eating reg food, going to [facility] today.  Objective:  Vital Signs in the last 24 hours: BP 131/61  Pulse 68  Temp(Src) 98.2 F (36.8 C) (Oral)  Resp 18  Ht 4\' 9"  (1.448 m)  Wt 60.1 kg (132 lb 7.9 oz)  BMI 28.67 kg/m2  SpO2 99%  Physical Exam: Elderly WF in NAD  Lungs:  Clear Cardiac:  Regular rhythm, normal S1 and S2, no S3 Extremities:  1+ edema present, edema of left arm.  Intake/Output from previous day: 03/25 0701 - 03/26 0700 In: 2160 [P.O.:960; I.V.:1200] Out: 950 [Urine:950]  Lab Results: Basic Metabolic Panel:  Basename 11/17/11 0547  NA 142  K 3.4*  CL 108  CO2 29  GLUCOSE 101*  BUN 9  CREATININE 0.43*   Telemetry: Sinus with paced rhythm.     Assessment/Plan:  1. Cardiac stable post op exploratory lap 2  Cardiomyopathy  Rec:  Fine to go to [facility] place.  If she develops fluid retention, she should resume her diuretics.   Darden Palmer  MD [facility]  11/18/2011, 1:38 PM

## 2011-11-18 NOTE — Progress Notes (Signed)
UR of chart complete.  

## 2011-11-18 NOTE — Discharge Summary (Signed)
Patient interviewed and examined, agree with PA note above.  Jhoanna Heyde T Truong Delcastillo MD, FACS  11/18/2011 4:18 PM   

## 2011-11-18 NOTE — Progress Notes (Signed)
Clinical Social Worker met with pt and friend at bedside.  CSW confirmed plan for dc to [facility] today.  CSW provided opportunity for pt to process questions/concerns.  CSW arranged transport. CSW to sign off at dc.   Angelia Mould, MSW, [place] [phone]

## 2011-11-18 NOTE — Discharge Summary (Signed)
Patient ID: [name] MRN: [id number] DOB/AGE: [birth year] 78 y.o.  Admit date: 11/06/2011 Discharge date: 11/18/2011  Procedures: Exploratory laparotomy with LOA and primary repair of left inguinal hernia  Consults: general surgery  Reason for Admission: This is an 78 yo female who presented to the [facility] with abdominal pain, nausea, and vomiting.  She is noted to have a left inguinal hernia and he pain seems to worsen when the hernia sticks out.  She was admitted for further evaluation.  Please see admitting H&P for further details.  Admission Diagnoses:  1. Abdominal pain secondary to SBO 2. Left inguinal hernia 3. Chronic systolic heart failure 4. HTN 5. Hyperlipidemia  Hospital Course: The patient was admitted and general surgery was consulted.  Her hernia was able to be easily reduced.  Her CT scan did show a bowel obstruction with dilated loops of small bowel.  She did have an NGT placed and she was watched closely.  With her hernia reduced, it was felt that hopefully her SBO would resolve.  She did pass some flatus, but continued to have impressive abdominal films that continued to show a bowel obstruction.  The patient's xeralto was held and ultimately surgery was pursued as she was not making much improvement.  She underwent exploratory laparotomy where she was found to have an omental adhesion causing a closed loops SBO.  This was lysed.  Her hernia was then primarily repaired, but ultimately not the cause of her obstruction.  Post-operatively, then patient went to a unit bed for close monitoring given her age and comorbidities.  She actually did quite well.  She was transferred to a telemetry floor on POD#2 and started on a clear liquid diet on POD#3.  Her diet was able to be advanced as tolerated.  She had a PT/OT evaluation who felt she would need SNF for rehab after discharge.  This was pursued.  The patient has her staples at time of discharge.  The bottom 2 staples were removed  and some cloudy, greasy, serous drainage was expressed.  No frank infection noted.  This area was not packed, but dry gauze placed over the wound.  I will arrange for the SNF to remove her staples on POD# 14 which is next Tuesday November 25, 2011.  The patient's other medical problems remained stable throughout her admission.  Discharge Diagnoses:  Principal Problem:  *Small bowel obstruction Active Problems:  Chronic systolic congestive heart failure  Hypertension  Mild dehydration s/p ex lap with LOA and primary repair of left inguinal hernia  Discharge Medications: Medication List  As of 11/18/2011 12:45 PM   Take these medications:        beta carotene w/minerals tablet      calcium-vitamin D 500-200 MG-UNIT per tablet      menthol-cetylpyridinium 3 MG lozenge      PRILOSEC 20 MG capsule            senna 8.6 MG Tabs                 aspirin 81 MG tablet   Take 81 mg by mouth daily.      cholecalciferol 1000 UNITS tablet   Commonly known as: VITAMIN D   Take 1,000 Units by mouth daily.      COREG 6.25 MG tablet   Generic drug: carvedilol   Take 6.25 mg by mouth 2 (two) times daily.      COZAAR 50 MG tablet   Generic drug: losartan   Take  50 mg by mouth daily.      furosemide 20 MG tablet   Commonly known as: LASIX   Take 20 mg by mouth 2 (two) times daily.      Glucosamine 500 MG Caps   Take 1,500-3,000 mg by mouth daily. 3000 mg daily?      lactobacillus acidophilus Tabs   Take 1 tablet by mouth daily.      Lutein 20 MG Caps   Take 20 mg by mouth daily.      meclizine 25 MG tablet   Commonly known as: ANTIVERT   Take 25 mg by mouth 3 (three) times daily as needed. Nausea/dizziness      MULTIVITAL tablet   Take 1 tablet by mouth daily.      oxybutynin 10 MG 24 hr tablet   Commonly known as: DITROPAN-XL   Take 10 mg by mouth daily.      oxycodone 5 MG capsule   Commonly known as: OXY-IR   Take 1 capsule (5 mg total) by mouth every 4 (four) hours  as needed.      simvastatin 20 MG tablet   Commonly known as: ZOCOR   Take 20 mg by mouth daily.      TYLENOL 500 MG tablet   Generic drug: acetaminophen   Take 500 mg by mouth as needed. For arthritis pain    rivaroxaban 10 MG Tabs tablet        Discharge Instructions: Follow-up Information    Follow up with Georgann Housekeeper, MD. (As needed)       Follow up with [facility], MD on 12/04/2011. (arrive at 4:30pm for a 4:50pm appointment)    Contact information:   [facility] [address] [place] [address] [phone]          Signed: Letha Cape 11/18/2011, 12:45 PM

## 2011-11-18 NOTE — Progress Notes (Signed)
Patient ID: [name], female   DOB: 1920-01-01, 78 y.o.   MRN: [id number] 7 Days Post-Op  Subjective: No C/O this AM  Objective: Vital signs in last 24 hours: Temp:  [98.5 F (36.9 C)-98.9 F (37.2 C)] 98.9 F (37.2 C) (03/26 0345) Pulse Rate:  [72-78] 72  (03/26 0646) Resp:  [18-20] 18  (03/26 0345) BP: (140-155)/(58-64) 148/62 mmHg (03/26 0646) SpO2:  [95 %-99 %] 98 % (03/26 0345) Weight:  [132 lb 7.9 oz (60.1 kg)] 132 lb 7.9 oz (60.1 kg) (03/26 0345) Last BM Date: 11/16/11  Intake/Output from previous day: 03/25 0701 - 03/26 0700 In: 1760 [P.O.:960; I.V.:800] Out: 950 [Urine:950] Intake/Output this shift: Total I/O In: -  Out: 175 [Urine:175]  General appearance: alert and no distress GI: normal findings: soft, non-tender Incision/Wound: Serous drainage lower wound, opened 1 cm.   Lab Results:  No results found for this basename: WBC:2,HGB:2,HCT:2,PLT:2 in the last 72 hours BMET  [facility] 11/17/11 0547  NA 142  K 3.4*  CL 108  CO2 29  GLUCOSE 101*  BUN 9  CREATININE 0.43*  CALCIUM 8.1*     Studies/Results: No results found.  Anti-infectives: Anti-infectives     Start     Dose/Rate Route Frequency Ordered Stop   11/11/11 0818   ertapenem (INVANZ) 1 g in sodium chloride 0.9 % 50 mL IVPB        1 g 100 mL/hr over 30 Minutes Intravenous 60 min pre-op 11/11/11 0818 11/11/11 1346          Assessment/Plan: s/p Procedure(s): EXPLORATORY LAPAROTOMY SMALL BOWEL RESECTION HERNIA REPAIR INGUINAL ADULT Doiong well OK for SNF Will need daily dry gauze dressing changes   LOS: 12 days    Retta Pitcher T 11/18/2011

## 2011-11-19 ENCOUNTER — Telehealth (INDEPENDENT_AMBULATORY_CARE_PROVIDER_SITE_OTHER): Payer: Self-pay | Admitting: General Surgery

## 2011-11-19 NOTE — Telephone Encounter (Signed)
[name] now at [facility].  Angelique Blonder is calling to clarify meds orders from pt's discharge:  Xeralto and Menthol-cetylpyridinum. Dr. Magnus Ivan paged to call her.

## 2011-11-20 DIAGNOSIS — R350 Frequency of micturition: Secondary | ICD-10-CM | POA: Diagnosis not present

## 2011-11-20 DIAGNOSIS — K56609 Unspecified intestinal obstruction, unspecified as to partial versus complete obstruction: Secondary | ICD-10-CM | POA: Diagnosis not present

## 2011-11-20 DIAGNOSIS — Z5331 Laparoscopic surgical procedure converted to open procedure: Secondary | ICD-10-CM | POA: Diagnosis not present

## 2011-11-20 DIAGNOSIS — I10 Essential (primary) hypertension: Secondary | ICD-10-CM | POA: Diagnosis not present

## 2011-11-20 DIAGNOSIS — I509 Heart failure, unspecified: Secondary | ICD-10-CM | POA: Diagnosis not present

## 2011-11-24 DIAGNOSIS — I10 Essential (primary) hypertension: Secondary | ICD-10-CM | POA: Diagnosis not present

## 2011-11-24 DIAGNOSIS — Z9889 Other specified postprocedural states: Secondary | ICD-10-CM | POA: Diagnosis not present

## 2011-11-24 DIAGNOSIS — M6281 Muscle weakness (generalized): Secondary | ICD-10-CM | POA: Diagnosis not present

## 2011-11-24 DIAGNOSIS — Z5189 Encounter for other specified aftercare: Secondary | ICD-10-CM | POA: Diagnosis not present

## 2011-11-24 DIAGNOSIS — M949 Disorder of cartilage, unspecified: Secondary | ICD-10-CM | POA: Diagnosis not present

## 2011-11-24 DIAGNOSIS — N390 Urinary tract infection, site not specified: Secondary | ICD-10-CM | POA: Diagnosis not present

## 2011-11-24 DIAGNOSIS — K56609 Unspecified intestinal obstruction, unspecified as to partial versus complete obstruction: Secondary | ICD-10-CM | POA: Diagnosis not present

## 2011-11-24 DIAGNOSIS — L0291 Cutaneous abscess, unspecified: Secondary | ICD-10-CM | POA: Diagnosis not present

## 2011-11-24 DIAGNOSIS — I509 Heart failure, unspecified: Secondary | ICD-10-CM | POA: Diagnosis not present

## 2011-11-24 DIAGNOSIS — K5669 Other intestinal obstruction: Secondary | ICD-10-CM | POA: Diagnosis not present

## 2011-11-24 DIAGNOSIS — Z4801 Encounter for change or removal of surgical wound dressing: Secondary | ICD-10-CM | POA: Diagnosis not present

## 2011-11-24 DIAGNOSIS — R262 Difficulty in walking, not elsewhere classified: Secondary | ICD-10-CM | POA: Diagnosis not present

## 2011-11-24 DIAGNOSIS — M899 Disorder of bone, unspecified: Secondary | ICD-10-CM | POA: Diagnosis not present

## 2011-11-24 DIAGNOSIS — K4091 Unilateral inguinal hernia, without obstruction or gangrene, recurrent: Secondary | ICD-10-CM | POA: Diagnosis not present

## 2011-11-28 ENCOUNTER — Encounter (HOSPITAL_COMMUNITY): Payer: Self-pay

## 2011-11-28 ENCOUNTER — Telehealth (INDEPENDENT_AMBULATORY_CARE_PROVIDER_SITE_OTHER): Payer: Self-pay

## 2011-11-28 ENCOUNTER — Emergency Department (HOSPITAL_COMMUNITY)
Admission: EM | Admit: 2011-11-28 | Discharge: 2011-11-28 | Disposition: A | Discharge status: Skilled Nursing Facility | Payer: Medicare Other | Attending: Emergency Medicine | Admitting: Emergency Medicine

## 2011-11-28 DIAGNOSIS — F411 Generalized anxiety disorder: Secondary | ICD-10-CM | POA: Insufficient documentation

## 2011-11-28 DIAGNOSIS — E785 Hyperlipidemia, unspecified: Secondary | ICD-10-CM | POA: Insufficient documentation

## 2011-11-28 DIAGNOSIS — I080 Rheumatic disorders of both mitral and aortic valves: Secondary | ICD-10-CM | POA: Insufficient documentation

## 2011-11-28 DIAGNOSIS — Z4801 Encounter for change or removal of surgical wound dressing: Secondary | ICD-10-CM | POA: Diagnosis not present

## 2011-11-28 DIAGNOSIS — Z79899 Other long term (current) drug therapy: Secondary | ICD-10-CM | POA: Insufficient documentation

## 2011-11-28 DIAGNOSIS — Z4889 Encounter for other specified surgical aftercare: Secondary | ICD-10-CM

## 2011-11-28 DIAGNOSIS — F3289 Other specified depressive episodes: Secondary | ICD-10-CM | POA: Insufficient documentation

## 2011-11-28 DIAGNOSIS — F329 Major depressive disorder, single episode, unspecified: Secondary | ICD-10-CM | POA: Insufficient documentation

## 2011-11-28 NOTE — ED Provider Notes (Signed)
History     CSN: [id number]  Arrival date & time 11/28/11  1027   First MD Initiated Contact with Patient 11/28/11 1047      Chief Complaint  Patient presents with  . Wound Dehiscence    (Consider location/radiation/quality/duration/timing/severity/associated sxs/prior treatment) HPI Comments: Had surgery for repair of a left inguinal hernia and small bowel obstruction, with lysis of adhesions about 11/06/2011. She has recovered and went to [facility], a rehab facility. Abdomen for evaluation of wound infection and possible fistula.  Patient is a 78 y.o. female presenting with wound check.  Wound Check     Past Medical History  Diagnosis Date  . Aortic valve regurgitation   . Left bundle branch block   . Hyperlipidemia   . Mitral valve disorder   . Pacemaker     For complete heart block, s/p generator change 09/2010   . Dyspnea   . Anxiety   . Depression   . Aneurysm of abdominal aorta     4.4 cm in 09/2010  . Closed intertrochanteric fracture of right hip 07/13/2011    Past Surgical History  Procedure Date  . Replacement total knee 2008    left by Dr. Marciano Sequin  . Pacemaker insertion 2006    complete heart block  . Insert / replace / remove pacemaker 2012    upgrade to biventricular  . Femur im nail 07/14/2011    Procedure: INTRAMEDULLARY (IM) NAIL FEMORAL;  Surgeon: Eulas Post;  Location: [facility] OR;  Service: Orthopedics;  Laterality: Right;  Synthes TFN, fracture table, big C arm, available after 4p  . Left ingunial hernia   . Laparotomy 11/11/2011    Procedure: EXPLORATORY LAPAROTOMY;  Surgeon: Shelly Rubenstein, MD;  Location: [facility] OR;  Service: General;  Laterality: N/A;  . Bowel resection 11/11/2011    Procedure: SMALL BOWEL RESECTION;  Surgeon: Shelly Rubenstein, MD;  Location: [facility] OR;  Service: General;  Laterality: N/A;  . Inguinal hernia repair 11/11/2011    Procedure: HERNIA REPAIR INGUINAL ADULT;  Surgeon: Shelly Rubenstein, MD;  Location: [facility] OR;  Service:  General;  Laterality: Left;    No family history on file.  History  Substance Use Topics  . Smoking status: Never Smoker   . Smokeless tobacco: Not on file  . Alcohol Use: Yes    OB History    Grav Para Term Preterm Abortions TAB SAB Ect Mult Living                  Review of Systems  Constitutional: Negative.  Negative for fever and chills.  HENT: Negative.   Eyes: Negative.   Respiratory: Negative.   Cardiovascular: Negative.   Gastrointestinal:       Postop for surgery for SBO, left inguinal hernia repair.  Genitourinary: Negative.   Musculoskeletal: Negative.   Skin: Negative.   Neurological: Negative.   Psychiatric/Behavioral: Negative.     Allergies  Ace inhibitors; Atorvastatin; and Other  Home Medications   Current Outpatient Rx  Name Route Sig Dispense Refill  . AMOXICILLIN 500 MG PO CAPS Oral Take 500 mg by mouth 3 (three) times daily. 7 day supply started  11-24-11 has has 3 days worth left    . ASPIRIN 81 MG PO TABS Oral Take 81 mg by mouth daily.      Marland Kitchen CARVEDILOL 6.25 MG PO TABS Oral Take 6.25 mg by mouth 2 (two) times daily.      Marland Kitchen VITAMIN D 1000 UNITS PO TABS  Oral Take 1,000 Units by mouth daily.      . FUROSEMIDE 20 MG PO TABS Oral Take 20 mg by mouth 2 (two) times daily.      Marland Kitchen GLUCOSAMINE 500 MG PO CAPS Oral Take 3,000 mg by mouth daily. Takes six  500 mg capsules daily.    . ACIDOPHILUS PO Oral Take 1 tablet by mouth daily.    Marland Kitchen LOSARTAN POTASSIUM 50 MG PO TABS Oral Take 50 mg by mouth daily.     . LUTEIN 20 MG PO CAPS Oral Take 20 mg by mouth daily.     . MULTIVITAL PO TABS Oral Take 1 tablet by mouth daily.     . OXYBUTYNIN CHLORIDE ER 10 MG PO TB24 Oral Take 10 mg by mouth daily.      . OXYCODONE HCL 5 MG PO CAPS Oral Take 5 mg by mouth every 4 (four) hours as needed. For pain    . SACCHAROMYCES BOULARDII 250 MG PO CAPS Oral Take 250 mg by mouth 2 (two) times daily. 7 day dose started 11-24-10    . SIMVASTATIN 20 MG PO TABS Oral Take 20 mg by  mouth daily.     . ACETAMINOPHEN 500 MG PO TABS Oral Take 500 mg by mouth as needed. For arthritis pain    . MECLIZINE HCL 25 MG PO TABS Oral Take 25 mg by mouth 3 (three) times daily as needed. Nausea/dizziness       BP 122/40  Pulse 67  Temp(Src) 98.9 F (37.2 C) (Oral)  Resp 18  SpO2 96%  Physical Exam  Nursing note and vitals reviewed. Constitutional: She is oriented to person, place, and time.       Pleasant elderly woman, no distress.  She is fairly deaf.  HENT:  Head: Normocephalic and atraumatic.  Eyes: Conjunctivae and EOM are normal. Pupils are equal, round, and reactive to light.  Neck: Normal range of motion. Neck supple.  Cardiovascular: Normal rate, regular rhythm and normal heart sounds.   Pulmonary/Chest: Effort normal and breath sounds normal.  Abdominal:       She has a midline surgical incision with multiple wound sinus tracts, and with the lower sinus showing a large retention suture in the base of the wound.  Musculoskeletal: Normal range of motion.  Neurological: She is alert and oriented to person, place, and time.       No sensory or motor intact.   Skin: Skin is warm and dry.  Psychiatric: She has a normal mood and affect. Her behavior is normal.    ED Course  Procedures (including critical care time)  11:16 AM Pt was seen by me and had physical exam.  General surgical service to see.    12:34 PM Pt seen by Barnetta Chapel, P.A.-C. For [facility].  She unroofed the pt's sinus tracts and packed them.  Pt is released back to her facility, [facility].  1. Encounter for postoperative wound check             Carleene Cooper III, MD 11/28/11 [phone]

## 2011-11-28 NOTE — Telephone Encounter (Signed)
[name] called to report the patient has bowel coming through her wound and they want her seen ASAP.   After speaking to Dr Magnus Ivan, he advises go to [facility] ER.  I notified [name].

## 2011-11-28 NOTE — Progress Notes (Signed)
Patient ID: [name], female   DOB: 1920-02-06, 78 y.o.   MRN: [id number]    Subjective: The patient is known to our CCS service as she just had a laparotomy with LOA and repair of a hernia.  She presented to the [facility] today secondary to possible wound dehiscence and evisceration.  I saw her in the [facility].  There is no stool or enteric contents draining from her wound.  She states she is otherwise doing great with no complaints.  Objective: Vital signs in last 24 hours: Temp:  [98.9 F (37.2 C)] 98.9 F (37.2 C) (04/05 1027) Pulse Rate:  [67] 67  (04/05 1027) Resp:  [18] 18  (04/05 1027) BP: (122)/(40) 122/40 mmHg (04/05 1027) SpO2:  [96 %] 96 % (04/05 1027)    Intake/Output from previous day:   Intake/Output this shift:    PE: Abd: wound is mostly healed except there are essentially 4 small chronic wound fistulae that are present.  When probed, these do not track any further than her fascia.  There is fibrinous exudate present, but no frank purulence.  There is some fibrin seen at the base of these fistulae.  Her fascia is completely intact with fascial sutures still in place.  The decision was made to open the patient's wound.  Please see procedure note.  Lab Results:  No results found for this basename: WBC:2,HGB:2,HCT:2,PLT:2 in the last 72 hours BMET No results found for this basename: NA:2,K:2,CL:2,CO2:2,GLUCOSE:2,BUN:2,CREATININE:2,CALCIUM:2 in the last 72 hours PT/INR No results found for this basename: LABPROT:2,INR:2 in the last 72 hours CMP     Component Value Date/Time   NA 142 11/17/2011 0547   K 3.4* 11/17/2011 0547   CL 108 11/17/2011 0547   CO2 29 11/17/2011 0547   GLUCOSE 101* 11/17/2011 0547   BUN 9 11/17/2011 0547   CREATININE 0.43* 11/17/2011 0547   CALCIUM 8.1* 11/17/2011 0547   PROT 6.8 11/06/2011 1559   ALBUMIN 3.7 11/06/2011 1559   AST 25 11/06/2011 1559   ALT 11 11/06/2011 1559   ALKPHOS 83 11/06/2011 1559   BILITOT 0.5 11/06/2011 1559   GFRNONAA 88*  11/17/2011 0547   GFRAA >90 11/17/2011 0547   Lipase     Component Value Date/Time   LIPASE 17 11/06/2011 1559       Studies/Results: No results found.  Anti-infectives: Anti-infectives    None       Assessment/Plan  1. S/p laparotomy with wound fistula and fibrin base, no evidence of dehiscence or evisceration. The patient has her wound reopened as it was felt that these small openings would have delayed healing as they would not be able to be packed and care for sufficiently without making the openings larger.  Because these spanned her entire wound, her whole wound was reopened.  This was then repacked with NS WD dressing change.  There was no evidence of infection and I decided not to put the patient on any abx therapy because of that.    She has follow up with Dr. Magnus Ivan in our office on 12-04-11.  I discussed the patient with him as well and he was in agreeance of all that was done today.  LOS: 0 days    OSBORNE,KELLY E 11/28/2011  Agree with above.  Ovidio Kin, MD, [facility] Surgery Pager: [phone] Office phone:  [phone]

## 2011-11-28 NOTE — Discharge Instructions (Signed)
Mrs. Uppal had unroofing of sinus tracts in her surgical wound today.  She should start wet to dry dressings to her wound tomorrow, as instructed on the note from Barnetta Chapel, P.A.-C. For [facility] Surgery.

## 2011-11-28 NOTE — ED Notes (Signed)
Pt was received to POD 11 with c/o partially dehisced surgical site with drainage noted . Pt denies any complaint at the moment

## 2011-11-28 NOTE — ED Notes (Signed)
PA from Surgery at the bedside

## 2011-11-28 NOTE — Procedures (Signed)
Incisional reopening Procedure Note  Pre-operative Diagnosis: Wound fistula with fibrin base  Post-operative Diagnosis: same  Indications: This is an 78 yo female who underwent laparotomy for SBO and had a LOA and hernia repair.  She presented to the [facility] today secondary to some fibrinous exudate and chronic wound tracts that were not healing well.  It was thought at the facility that she might have a enterocutaneous fistula or dehiscence.  She did not appear to have either of these, just chronic tracts that would not heal well without opening them up.  Anesthesia: 1% plain lidocaine  Procedure Details  The procedure, risks and complications have been discussed in detail (including, but not limited to airway compromise, infection, bleeding) with the patient, and the patient has signed consent to the procedure.  The skin was sterilely prepped and draped over the affected area in the usual fashion. After adequate local anesthesia, and incision was made with a #11 blade on the midline incision.  This was reopened up.  Her fascial sutures were visible but none were cut.  She had some necrotic appearing fat at the base, but no evidence of infection.  Part of her PDS knot was trimmed to prevent further discomfort. Purulent drainage: absent.  She did have some bleeding from the left superior edge of her wound.  Pressure was held and eventually silver nitrate sticks were used.  Bleeding stopped and the wound bed was dry.  NS WD dressing was placed in her wound.  It was otherwise very clean at this time. The patient was observed until stable.  Findings: See above  EBL: 20 cc's  Drains: none  Condition: Tolerated procedure well and Stable   Complications: none.  OSBORNE,KELLY E 12:47 PM 11/28/2011  Agree.  Ovidio Kin, MD, [facility] Surgery Pager: [phone] Office phone:  [phone]

## 2011-11-28 NOTE — ED Notes (Signed)
Pt was brought in by ambulance from a NH with c/o drainage and dehisced incision site. Pt had abdominal surgery done on the 11/06/2011 here at [facility]. NH staff noted drainage from the surgical site. Pt denies any N/V/D

## 2011-11-28 NOTE — ED Notes (Signed)
Report called to Angelique Blonder, Nursing Supervisor at the [facility]. [facility] is at the bedside

## 2011-11-28 NOTE — ED Notes (Signed)
Dressing to abdomen is dry and intact.

## 2011-12-02 ENCOUNTER — Encounter (INDEPENDENT_AMBULATORY_CARE_PROVIDER_SITE_OTHER): Payer: Self-pay | Admitting: Surgery

## 2011-12-04 ENCOUNTER — Encounter (INDEPENDENT_AMBULATORY_CARE_PROVIDER_SITE_OTHER): Payer: Self-pay | Admitting: Surgery

## 2011-12-04 ENCOUNTER — Ambulatory Visit (INDEPENDENT_AMBULATORY_CARE_PROVIDER_SITE_OTHER): Payer: Medicare Other | Admitting: Surgery

## 2011-12-04 VITALS — BP 138/62 | HR 88 | Temp 97.2°F | Resp 20 | Ht 59.5 in | Wt 102.0 lb

## 2011-12-04 DIAGNOSIS — Z09 Encounter for follow-up examination after completed treatment for conditions other than malignant neoplasm: Secondary | ICD-10-CM

## 2011-12-04 NOTE — Progress Notes (Signed)
Subjective:     Patient ID: [name], female   DOB: Jan 18, 1920, 78 y.o.   MRN: [id number]  HPI  She is here for her postop visit status post exploratory laparotomy for a small bowel obstruction. She had to have a bowel resection at that time. She is now on a rehabilitation facility. She had postop wound infection. She is undergoing wet-to-dry dressing changes b.i.d. She is eating well and moving her bowels well. Review of Systems     Objective:   Physical Exam    Her incision is healing well. I did sharply excising fibrinous exudate and removed and all suture. There is no evidence of infection Assessment:     Patient stable status post port for laparotomy for bowel obstruction    Plan:     She will continue wet-to-dry dressing changes b.i.d. She will continue her rehabilitation. I will see her back in 3 weeks

## 2011-12-15 DIAGNOSIS — Z5189 Encounter for other specified aftercare: Secondary | ICD-10-CM | POA: Diagnosis not present

## 2011-12-15 DIAGNOSIS — R262 Difficulty in walking, not elsewhere classified: Secondary | ICD-10-CM | POA: Diagnosis not present

## 2011-12-15 DIAGNOSIS — M6281 Muscle weakness (generalized): Secondary | ICD-10-CM | POA: Diagnosis not present

## 2011-12-16 DIAGNOSIS — M6281 Muscle weakness (generalized): Secondary | ICD-10-CM | POA: Diagnosis not present

## 2011-12-16 DIAGNOSIS — R262 Difficulty in walking, not elsewhere classified: Secondary | ICD-10-CM | POA: Diagnosis not present

## 2011-12-16 DIAGNOSIS — Z5189 Encounter for other specified aftercare: Secondary | ICD-10-CM | POA: Diagnosis not present

## 2011-12-17 DIAGNOSIS — R262 Difficulty in walking, not elsewhere classified: Secondary | ICD-10-CM | POA: Diagnosis not present

## 2011-12-17 DIAGNOSIS — M6281 Muscle weakness (generalized): Secondary | ICD-10-CM | POA: Diagnosis not present

## 2011-12-17 DIAGNOSIS — Z5189 Encounter for other specified aftercare: Secondary | ICD-10-CM | POA: Diagnosis not present

## 2011-12-18 DIAGNOSIS — Z5189 Encounter for other specified aftercare: Secondary | ICD-10-CM | POA: Diagnosis not present

## 2011-12-18 DIAGNOSIS — R262 Difficulty in walking, not elsewhere classified: Secondary | ICD-10-CM | POA: Diagnosis not present

## 2011-12-18 DIAGNOSIS — M6281 Muscle weakness (generalized): Secondary | ICD-10-CM | POA: Diagnosis not present

## 2011-12-19 DIAGNOSIS — R262 Difficulty in walking, not elsewhere classified: Secondary | ICD-10-CM | POA: Diagnosis not present

## 2011-12-19 DIAGNOSIS — Z5189 Encounter for other specified aftercare: Secondary | ICD-10-CM | POA: Diagnosis not present

## 2011-12-19 DIAGNOSIS — M6281 Muscle weakness (generalized): Secondary | ICD-10-CM | POA: Diagnosis not present

## 2011-12-22 DIAGNOSIS — R262 Difficulty in walking, not elsewhere classified: Secondary | ICD-10-CM | POA: Diagnosis not present

## 2011-12-22 DIAGNOSIS — Z5189 Encounter for other specified aftercare: Secondary | ICD-10-CM | POA: Diagnosis not present

## 2011-12-22 DIAGNOSIS — M6281 Muscle weakness (generalized): Secondary | ICD-10-CM | POA: Diagnosis not present

## 2011-12-23 DIAGNOSIS — R262 Difficulty in walking, not elsewhere classified: Secondary | ICD-10-CM | POA: Diagnosis not present

## 2011-12-23 DIAGNOSIS — Z5189 Encounter for other specified aftercare: Secondary | ICD-10-CM | POA: Diagnosis not present

## 2011-12-23 DIAGNOSIS — M6281 Muscle weakness (generalized): Secondary | ICD-10-CM | POA: Diagnosis not present

## 2011-12-24 DIAGNOSIS — R262 Difficulty in walking, not elsewhere classified: Secondary | ICD-10-CM | POA: Diagnosis not present

## 2011-12-24 DIAGNOSIS — M6281 Muscle weakness (generalized): Secondary | ICD-10-CM | POA: Diagnosis not present

## 2011-12-25 ENCOUNTER — Encounter (INDEPENDENT_AMBULATORY_CARE_PROVIDER_SITE_OTHER): Payer: Self-pay | Admitting: Surgery

## 2011-12-25 ENCOUNTER — Ambulatory Visit (INDEPENDENT_AMBULATORY_CARE_PROVIDER_SITE_OTHER): Payer: Medicare Other | Admitting: Surgery

## 2011-12-25 VITALS — BP 121/77 | HR 80 | Temp 98.5°F | Resp 12 | Ht 60.0 in | Wt 109.6 lb

## 2011-12-25 DIAGNOSIS — Z09 Encounter for follow-up examination after completed treatment for conditions other than malignant neoplasm: Secondary | ICD-10-CM

## 2011-12-25 NOTE — Progress Notes (Signed)
Subjective:     Patient ID: [name], female   DOB: 1919/12/04, 78 y.o.   MRN: [id number]  HPI  She is here for another postoperative visit. She is still in the nursing facility. She is eating well moving her bowels well. She is still undergoing twice daily wet to dry dressing changes Review of Systems     Objective:   Physical Exam On exam, the incision is healing very well. There still granulation tissue which I treated with silver nitrate    Assessment:     A. status post exploratory laparotomy for bowel traction    Plan:     She will continue the current wound care and I will see her back in 4 weeks

## 2012-01-12 DIAGNOSIS — M899 Disorder of bone, unspecified: Secondary | ICD-10-CM | POA: Diagnosis not present

## 2012-01-12 DIAGNOSIS — M949 Disorder of cartilage, unspecified: Secondary | ICD-10-CM | POA: Diagnosis not present

## 2012-01-12 DIAGNOSIS — I509 Heart failure, unspecified: Secondary | ICD-10-CM | POA: Diagnosis not present

## 2012-01-12 DIAGNOSIS — K5669 Other intestinal obstruction: Secondary | ICD-10-CM | POA: Diagnosis not present

## 2012-01-12 DIAGNOSIS — I10 Essential (primary) hypertension: Secondary | ICD-10-CM | POA: Diagnosis not present

## 2012-01-12 DIAGNOSIS — K4091 Unilateral inguinal hernia, without obstruction or gangrene, recurrent: Secondary | ICD-10-CM | POA: Diagnosis not present

## 2012-01-12 DIAGNOSIS — N318 Other neuromuscular dysfunction of bladder: Secondary | ICD-10-CM | POA: Diagnosis not present

## 2012-01-13 DIAGNOSIS — E559 Vitamin D deficiency, unspecified: Secondary | ICD-10-CM | POA: Diagnosis not present

## 2012-01-13 DIAGNOSIS — E785 Hyperlipidemia, unspecified: Secondary | ICD-10-CM | POA: Diagnosis not present

## 2012-01-14 DIAGNOSIS — I059 Rheumatic mitral valve disease, unspecified: Secondary | ICD-10-CM | POA: Diagnosis not present

## 2012-01-14 DIAGNOSIS — Z950 Presence of cardiac pacemaker: Secondary | ICD-10-CM | POA: Diagnosis not present

## 2012-01-14 DIAGNOSIS — I509 Heart failure, unspecified: Secondary | ICD-10-CM | POA: Diagnosis not present

## 2012-01-14 DIAGNOSIS — I447 Left bundle-branch block, unspecified: Secondary | ICD-10-CM | POA: Diagnosis not present

## 2012-01-14 DIAGNOSIS — E785 Hyperlipidemia, unspecified: Secondary | ICD-10-CM | POA: Diagnosis not present

## 2012-01-14 DIAGNOSIS — I359 Nonrheumatic aortic valve disorder, unspecified: Secondary | ICD-10-CM | POA: Diagnosis not present

## 2012-01-14 DIAGNOSIS — I5022 Chronic systolic (congestive) heart failure: Secondary | ICD-10-CM | POA: Diagnosis not present

## 2012-01-14 DIAGNOSIS — S72143A Displaced intertrochanteric fracture of unspecified femur, initial encounter for closed fracture: Secondary | ICD-10-CM | POA: Diagnosis not present

## 2012-01-14 DIAGNOSIS — I714 Abdominal aortic aneurysm, without rupture: Secondary | ICD-10-CM | POA: Diagnosis not present

## 2012-01-23 ENCOUNTER — Ambulatory Visit (INDEPENDENT_AMBULATORY_CARE_PROVIDER_SITE_OTHER): Payer: Medicare Other | Admitting: Surgery

## 2012-01-23 ENCOUNTER — Encounter (INDEPENDENT_AMBULATORY_CARE_PROVIDER_SITE_OTHER): Payer: Self-pay | Admitting: Surgery

## 2012-01-23 VITALS — BP 126/82 | HR 76 | Temp 98.6°F | Resp 16 | Ht 59.0 in | Wt 103.6 lb

## 2012-01-23 DIAGNOSIS — Z09 Encounter for follow-up examination after completed treatment for conditions other than malignant neoplasm: Secondary | ICD-10-CM

## 2012-01-23 DIAGNOSIS — Z950 Presence of cardiac pacemaker: Secondary | ICD-10-CM | POA: Diagnosis not present

## 2012-01-23 DIAGNOSIS — E785 Hyperlipidemia, unspecified: Secondary | ICD-10-CM | POA: Diagnosis not present

## 2012-01-23 DIAGNOSIS — I359 Nonrheumatic aortic valve disorder, unspecified: Secondary | ICD-10-CM | POA: Diagnosis not present

## 2012-01-23 DIAGNOSIS — I5022 Chronic systolic (congestive) heart failure: Secondary | ICD-10-CM | POA: Diagnosis not present

## 2012-01-23 DIAGNOSIS — I059 Rheumatic mitral valve disease, unspecified: Secondary | ICD-10-CM | POA: Diagnosis not present

## 2012-01-23 DIAGNOSIS — I447 Left bundle-branch block, unspecified: Secondary | ICD-10-CM | POA: Diagnosis not present

## 2012-01-23 DIAGNOSIS — I714 Abdominal aortic aneurysm, without rupture: Secondary | ICD-10-CM | POA: Diagnosis not present

## 2012-01-23 NOTE — Progress Notes (Signed)
Subjective:     Patient ID: [name], female   DOB: Jan 20, 1920, 78 y.o.   MRN: [id number]  HPI She is here for another postoperative visit. She remains in a rehabilitation facility is doing much much better. She is now walking with a walker. She has no complaints  Review of Systems     Objective:   Physical Exam The wound is much smaller now her midline incision. I treated with silver nitrate. She has a recurrent reducible left inguinal hernia    Assessment:     Patient stable postop    Plan:     We'll change the dressings now daily instead of b.i.d. I will see her back in one

## 2012-01-26 DIAGNOSIS — Z5189 Encounter for other specified aftercare: Secondary | ICD-10-CM | POA: Diagnosis not present

## 2012-01-26 DIAGNOSIS — R262 Difficulty in walking, not elsewhere classified: Secondary | ICD-10-CM | POA: Diagnosis not present

## 2012-01-26 DIAGNOSIS — M6281 Muscle weakness (generalized): Secondary | ICD-10-CM | POA: Diagnosis not present

## 2012-01-27 DIAGNOSIS — M6281 Muscle weakness (generalized): Secondary | ICD-10-CM | POA: Diagnosis not present

## 2012-01-27 DIAGNOSIS — Z5189 Encounter for other specified aftercare: Secondary | ICD-10-CM | POA: Diagnosis not present

## 2012-01-27 DIAGNOSIS — R262 Difficulty in walking, not elsewhere classified: Secondary | ICD-10-CM | POA: Diagnosis not present

## 2012-01-28 DIAGNOSIS — M6281 Muscle weakness (generalized): Secondary | ICD-10-CM | POA: Diagnosis not present

## 2012-01-28 DIAGNOSIS — Z5189 Encounter for other specified aftercare: Secondary | ICD-10-CM | POA: Diagnosis not present

## 2012-01-28 DIAGNOSIS — R262 Difficulty in walking, not elsewhere classified: Secondary | ICD-10-CM | POA: Diagnosis not present

## 2012-01-29 DIAGNOSIS — R262 Difficulty in walking, not elsewhere classified: Secondary | ICD-10-CM | POA: Diagnosis not present

## 2012-01-29 DIAGNOSIS — Z5189 Encounter for other specified aftercare: Secondary | ICD-10-CM | POA: Diagnosis not present

## 2012-01-29 DIAGNOSIS — M6281 Muscle weakness (generalized): Secondary | ICD-10-CM | POA: Diagnosis not present

## 2012-01-30 DIAGNOSIS — R262 Difficulty in walking, not elsewhere classified: Secondary | ICD-10-CM | POA: Diagnosis not present

## 2012-01-30 DIAGNOSIS — M6281 Muscle weakness (generalized): Secondary | ICD-10-CM | POA: Diagnosis not present

## 2012-01-30 DIAGNOSIS — Z5189 Encounter for other specified aftercare: Secondary | ICD-10-CM | POA: Diagnosis not present

## 2012-02-02 ENCOUNTER — Encounter: Payer: Self-pay | Admitting: Vascular Surgery

## 2012-02-02 DIAGNOSIS — R262 Difficulty in walking, not elsewhere classified: Secondary | ICD-10-CM | POA: Diagnosis not present

## 2012-02-02 DIAGNOSIS — Z5189 Encounter for other specified aftercare: Secondary | ICD-10-CM | POA: Diagnosis not present

## 2012-02-02 DIAGNOSIS — M6281 Muscle weakness (generalized): Secondary | ICD-10-CM | POA: Diagnosis not present

## 2012-02-03 ENCOUNTER — Encounter: Payer: Self-pay | Admitting: Vascular Surgery

## 2012-02-03 ENCOUNTER — Ambulatory Visit (INDEPENDENT_AMBULATORY_CARE_PROVIDER_SITE_OTHER): Payer: Medicare Other | Admitting: Vascular Surgery

## 2012-02-03 VITALS — BP 157/64 | HR 70 | Temp 97.9°F | Ht 60.0 in | Wt 107.0 lb

## 2012-02-03 DIAGNOSIS — I714 Abdominal aortic aneurysm, without rupture: Secondary | ICD-10-CM | POA: Diagnosis not present

## 2012-02-03 DIAGNOSIS — M6281 Muscle weakness (generalized): Secondary | ICD-10-CM | POA: Diagnosis not present

## 2012-02-03 DIAGNOSIS — Z5189 Encounter for other specified aftercare: Secondary | ICD-10-CM | POA: Diagnosis not present

## 2012-02-03 DIAGNOSIS — R262 Difficulty in walking, not elsewhere classified: Secondary | ICD-10-CM | POA: Diagnosis not present

## 2012-02-03 NOTE — Progress Notes (Signed)
Patient presents today for followup of her known infrarenal abdominal aortic aneurysm. She has had a difficult time the last several months. She did have a small bowel obstruction requiring open operation and resection. She has had an open abdominal wound which is slowly healing with daily dressing change. She has no symptoms referable to her aneurysm. She does be quite alert. She is here today for ultrasound from abdominal aortic aneurysm.  Past Medical History  Diagnosis Date  . Aortic valve regurgitation   . Left bundle branch block   . Hyperlipidemia   . Mitral valve disorder   . Pacemaker     For complete heart block, s/p generator change 09/2010   . Dyspnea   . Anxiety   . Depression   . Aneurysm of abdominal aorta     4.4 cm in 09/2010  . Closed intertrochanteric fracture of right hip 07/13/2011  . CHF (congestive heart failure)     History  Substance Use Topics  . Smoking status: Never Smoker   . Smokeless tobacco: Never Used  . Alcohol Use: No    Family History  Problem Relation Age of Onset  . Other Brother     AAA    Allergies  Allergen Reactions  . Ace Inhibitors Other (See Comments)    unknown  . Atorvastatin Other (See Comments)    unknown  . Other     " Mind Al[facility] Drugs" caretaker says it can be "disastrous."    Current outpatient prescriptions:acetaminophen (TYLENOL) 500 MG tablet, Take 500 mg by mouth as needed. For arthritis pain, Disp: , Rfl: ;  aspirin 81 MG tablet, Take 81 mg by mouth daily.  , Disp: , Rfl: ;  carvedilol (COREG) 6.25 MG tablet, Take 6.25 mg by mouth 2 (two) times daily.  , Disp: , Rfl: ;  cholecalciferol (VITAMIN D) 1000 UNITS tablet, Take 1,000 Units by mouth daily.  , Disp: , Rfl:  furosemide (LASIX) 20 MG tablet, Take 20 mg by mouth 2 (two) times daily.  , Disp: , Rfl: ;  Glucosamine 500 MG CAPS, Take 3,000 mg by mouth daily. Takes six  500 mg capsules daily., Disp: , Rfl: ;  Lactobacillus (ACIDOPHILUS PO), Take 1 tablet by mouth  daily., Disp: , Rfl: ;  losartan (COZAAR) 50 MG tablet, Take 50 mg by mouth daily. , Disp: , Rfl: ;  Lutein 20 MG CAPS, Take 20 mg by mouth daily. , Disp: , Rfl:  meclizine (ANTIVERT) 25 MG tablet, Take 25 mg by mouth 3 (three) times daily as needed. Nausea/dizziness , Disp: , Rfl: ;  Multiple Vitamins-Minerals (MULTIVITAL) tablet, Take 1 tablet by mouth daily. , Disp: , Rfl: ;  oxybutynin (DITROPAN-XL) 10 MG 24 hr tablet, Take 10 mg by mouth daily.  , Disp: , Rfl: ;  promethazine (PHENERGAN) 25 MG suppository, , Disp: , Rfl: ;  simvastatin (ZOCOR) 20 MG tablet, Take 20 mg by mouth daily. , Disp: , Rfl:  amoxicillin (AMOXIL) 500 MG capsule, Take 500 mg by mouth 3 (three) times daily. 7 day supply started  11-24-11 has has 3 days worth left, Disp: , Rfl: ;  saccharomyces boulardii (FLORASTOR) 250 MG capsule, Take 250 mg by mouth 2 (two) times daily. 7 day dose started 11-24-10, Disp: , Rfl: ;  DISCONTD: calcium-vitamin D (OSCAL WITH D) 500-200 MG-UNIT per tablet, Take 1 tablet by mouth daily., Disp: 100 tablet, Rfl: 2 DISCONTD: omeprazole (PRILOSEC) 20 MG capsule, Take 20 mg by mouth daily.  , Disp: ,  Rfl: ;  DISCONTD: rivaroxaban (XARELTO) 10 MG TABS tablet, Take 1 tablet (10 mg total) by mouth daily., Disp: 30 tablet, Rfl: 0  BP 157/64  Pulse 70  Temp(Src) 97.9 F (36.6 C) (Oral)  Ht 5' (1.524 m)  Wt 107 lb (48.535 kg)  BMI 20.90 kg/m2  SpO2 99%  Body mass index is 20.90 kg/(m^2).       Physical exam well-developed thin white female no acute distress. Her radial and femoral pulses are 2+ bilaterally. Abdominal wound does have a dressing packed in place and this was not removed. She does not have any abdominal tenderness. She is grossly intact neurologically.  Ultrasound of her aorta rose maximal diameter 4.7 cm as compared to 4.35 cm on her most recent study and June 2012.  I did review her CT scan from March admission for bowel traction and 2012. This shows a maximal aortic diameter of 5.7 cm.  She does have a short infrarenal neck and this is angulated. She does have some ectasia of her iliac arteries with no flow limiting stenosis.  Impression and plan: 5.7 cm infrarenal abdominal aortic aneurysm. She is not a stent graft candidate due to angulation in close proximity to the renal arteries. I had a very long discussion with the patient and her caregiver present and also discussed this with her brother who is a retired Biomedical engineer in [place]. He incidentally had open aneurysm repair for an aneurysm as well. I do not for she tolerates magnitude of open aneurysm repair especially in light of her most recent artery for bowel obstruction. She is not a stent graft candidate. She isn't fully remove this and would not consent to consideration for open repair. I feel that her annual risk for rupture proximally as 10% inhibitor certainly her operative mortality would be much greater than this. She wished to see [place] again in one year with ultrasound followup. I again reviewed symptoms of aneurysm disease with the patient who understands

## 2012-02-04 DIAGNOSIS — R262 Difficulty in walking, not elsewhere classified: Secondary | ICD-10-CM | POA: Diagnosis not present

## 2012-02-04 DIAGNOSIS — M6281 Muscle weakness (generalized): Secondary | ICD-10-CM | POA: Diagnosis not present

## 2012-02-04 DIAGNOSIS — Z5189 Encounter for other specified aftercare: Secondary | ICD-10-CM | POA: Diagnosis not present

## 2012-02-04 NOTE — Addendum Note (Signed)
Addended by: Sharee Pimple on: 02/04/2012 10:05 AM   Modules accepted: Orders

## 2012-02-05 DIAGNOSIS — R350 Frequency of micturition: Secondary | ICD-10-CM | POA: Diagnosis not present

## 2012-02-05 DIAGNOSIS — K56609 Unspecified intestinal obstruction, unspecified as to partial versus complete obstruction: Secondary | ICD-10-CM | POA: Diagnosis not present

## 2012-02-05 DIAGNOSIS — I509 Heart failure, unspecified: Secondary | ICD-10-CM | POA: Diagnosis not present

## 2012-02-05 DIAGNOSIS — E785 Hyperlipidemia, unspecified: Secondary | ICD-10-CM | POA: Diagnosis not present

## 2012-02-05 DIAGNOSIS — I10 Essential (primary) hypertension: Secondary | ICD-10-CM | POA: Diagnosis not present

## 2012-02-05 NOTE — Procedures (Unsigned)
DUPLEX ULTRASOUND OF ABDOMINAL AORTA  INDICATION:  Abdominal aortic aneurysm.  HISTORY: Diabetes:  No. Cardiac:  CHF, pacemaker. Hypertension:  Yes. Smoking:  No. Connective Tissue Disorder: Family History:  Brother. Previous Surgery:  No aortic intervention.  DUPLEX EXAM:         AP (cm)                   TRANSVERSE (cm) Proximal             3.07 cm                   3.07 cm Mid                  2.55 cm                   2.48 cm Distal               4.64/2.62 cm              4.71/2.62 cm Right Iliac          1.22 cm                   1.37 cm Left Iliac           1.33 cm                   1.40 cm  PREVIOUS:  Date: 01/29/2011  AP:  4.28  TRANSVERSE:  4.35  IMPRESSION: 1. Abdominal aortic aneurysm present measuring 4.64 cm X 4.71 cm in     diameter, no intramural thrombus is evident. 2. Increased in diameter since previous study on 01/29/2011.  ___________________________________________ Larina Earthly, M.D.  SH/MEDQ  D:  02/03/2012  T:  02/03/2012  Job:  [id number]

## 2012-02-06 DIAGNOSIS — M47817 Spondylosis without myelopathy or radiculopathy, lumbosacral region: Secondary | ICD-10-CM | POA: Diagnosis not present

## 2012-02-06 DIAGNOSIS — T8189XA Other complications of procedures, not elsewhere classified, initial encounter: Secondary | ICD-10-CM | POA: Diagnosis not present

## 2012-02-06 DIAGNOSIS — I509 Heart failure, unspecified: Secondary | ICD-10-CM | POA: Diagnosis not present

## 2012-02-06 DIAGNOSIS — F329 Major depressive disorder, single episode, unspecified: Secondary | ICD-10-CM | POA: Diagnosis not present

## 2012-02-06 DIAGNOSIS — D649 Anemia, unspecified: Secondary | ICD-10-CM | POA: Diagnosis not present

## 2012-02-09 DIAGNOSIS — F329 Major depressive disorder, single episode, unspecified: Secondary | ICD-10-CM | POA: Diagnosis not present

## 2012-02-09 DIAGNOSIS — I509 Heart failure, unspecified: Secondary | ICD-10-CM | POA: Diagnosis not present

## 2012-02-09 DIAGNOSIS — D649 Anemia, unspecified: Secondary | ICD-10-CM | POA: Diagnosis not present

## 2012-02-09 DIAGNOSIS — M47817 Spondylosis without myelopathy or radiculopathy, lumbosacral region: Secondary | ICD-10-CM | POA: Diagnosis not present

## 2012-02-09 DIAGNOSIS — T8189XA Other complications of procedures, not elsewhere classified, initial encounter: Secondary | ICD-10-CM | POA: Diagnosis not present

## 2012-02-10 DIAGNOSIS — I509 Heart failure, unspecified: Secondary | ICD-10-CM | POA: Diagnosis not present

## 2012-02-10 DIAGNOSIS — N3942 Incontinence without sensory awareness: Secondary | ICD-10-CM | POA: Diagnosis not present

## 2012-02-10 DIAGNOSIS — I714 Abdominal aortic aneurysm, without rupture: Secondary | ICD-10-CM | POA: Diagnosis not present

## 2012-02-10 DIAGNOSIS — I10 Essential (primary) hypertension: Secondary | ICD-10-CM | POA: Diagnosis not present

## 2012-02-10 DIAGNOSIS — K219 Gastro-esophageal reflux disease without esophagitis: Secondary | ICD-10-CM | POA: Diagnosis not present

## 2012-02-10 DIAGNOSIS — E782 Mixed hyperlipidemia: Secondary | ICD-10-CM | POA: Diagnosis not present

## 2012-02-11 DIAGNOSIS — D649 Anemia, unspecified: Secondary | ICD-10-CM | POA: Diagnosis not present

## 2012-02-11 DIAGNOSIS — F329 Major depressive disorder, single episode, unspecified: Secondary | ICD-10-CM | POA: Diagnosis not present

## 2012-02-11 DIAGNOSIS — I509 Heart failure, unspecified: Secondary | ICD-10-CM | POA: Diagnosis not present

## 2012-02-11 DIAGNOSIS — M47817 Spondylosis without myelopathy or radiculopathy, lumbosacral region: Secondary | ICD-10-CM | POA: Diagnosis not present

## 2012-02-11 DIAGNOSIS — T8189XA Other complications of procedures, not elsewhere classified, initial encounter: Secondary | ICD-10-CM | POA: Diagnosis not present

## 2012-02-13 DIAGNOSIS — F329 Major depressive disorder, single episode, unspecified: Secondary | ICD-10-CM | POA: Diagnosis not present

## 2012-02-13 DIAGNOSIS — I509 Heart failure, unspecified: Secondary | ICD-10-CM | POA: Diagnosis not present

## 2012-02-13 DIAGNOSIS — T8189XA Other complications of procedures, not elsewhere classified, initial encounter: Secondary | ICD-10-CM | POA: Diagnosis not present

## 2012-02-13 DIAGNOSIS — D649 Anemia, unspecified: Secondary | ICD-10-CM | POA: Diagnosis not present

## 2012-02-13 DIAGNOSIS — M47817 Spondylosis without myelopathy or radiculopathy, lumbosacral region: Secondary | ICD-10-CM | POA: Diagnosis not present

## 2012-02-15 DIAGNOSIS — M47817 Spondylosis without myelopathy or radiculopathy, lumbosacral region: Secondary | ICD-10-CM | POA: Diagnosis not present

## 2012-02-15 DIAGNOSIS — T8189XA Other complications of procedures, not elsewhere classified, initial encounter: Secondary | ICD-10-CM | POA: Diagnosis not present

## 2012-02-15 DIAGNOSIS — D649 Anemia, unspecified: Secondary | ICD-10-CM | POA: Diagnosis not present

## 2012-02-15 DIAGNOSIS — F329 Major depressive disorder, single episode, unspecified: Secondary | ICD-10-CM | POA: Diagnosis not present

## 2012-02-15 DIAGNOSIS — I509 Heart failure, unspecified: Secondary | ICD-10-CM | POA: Diagnosis not present

## 2012-02-17 ENCOUNTER — Telehealth (INDEPENDENT_AMBULATORY_CARE_PROVIDER_SITE_OTHER): Payer: Self-pay | Admitting: General Surgery

## 2012-02-17 DIAGNOSIS — T8189XA Other complications of procedures, not elsewhere classified, initial encounter: Secondary | ICD-10-CM | POA: Diagnosis not present

## 2012-02-17 DIAGNOSIS — D649 Anemia, unspecified: Secondary | ICD-10-CM | POA: Diagnosis not present

## 2012-02-17 DIAGNOSIS — M47817 Spondylosis without myelopathy or radiculopathy, lumbosacral region: Secondary | ICD-10-CM | POA: Diagnosis not present

## 2012-02-17 DIAGNOSIS — I509 Heart failure, unspecified: Secondary | ICD-10-CM | POA: Diagnosis not present

## 2012-02-17 DIAGNOSIS — F329 Major depressive disorder, single episode, unspecified: Secondary | ICD-10-CM | POA: Diagnosis not present

## 2012-02-17 NOTE — Telephone Encounter (Signed)
SCOTT ROM GENTIVA CALLED TO REPORT THAT WHEN HE WAS CHANGING PT'S DRESSING HE NOTED A SCABBED AREA AND REMOVED IT TO FIND A PIECE OF CLEAR/WHITE PIECE OF WHAT COULD BE SUTURE MATERIAL/ NO EXCESSIVE DRAINAGE OR BLEEDING NOTED FROM WOUND BED/ REDRESSED WITH WET TO DRY 2X2/ NO FEVER REPORTED. SCOTT WILL VISIT PATIENT FOR DRESSING CHANGE ON Thursday AND R/GYEPORT

## 2012-02-19 DIAGNOSIS — D649 Anemia, unspecified: Secondary | ICD-10-CM | POA: Diagnosis not present

## 2012-02-19 DIAGNOSIS — I509 Heart failure, unspecified: Secondary | ICD-10-CM | POA: Diagnosis not present

## 2012-02-19 DIAGNOSIS — F329 Major depressive disorder, single episode, unspecified: Secondary | ICD-10-CM | POA: Diagnosis not present

## 2012-02-19 DIAGNOSIS — M47817 Spondylosis without myelopathy or radiculopathy, lumbosacral region: Secondary | ICD-10-CM | POA: Diagnosis not present

## 2012-02-19 DIAGNOSIS — T8189XA Other complications of procedures, not elsewhere classified, initial encounter: Secondary | ICD-10-CM | POA: Diagnosis not present

## 2012-02-20 ENCOUNTER — Ambulatory Visit (INDEPENDENT_AMBULATORY_CARE_PROVIDER_SITE_OTHER): Payer: Medicare Other | Admitting: Surgery

## 2012-02-20 DIAGNOSIS — D649 Anemia, unspecified: Secondary | ICD-10-CM | POA: Diagnosis not present

## 2012-02-20 DIAGNOSIS — F329 Major depressive disorder, single episode, unspecified: Secondary | ICD-10-CM | POA: Diagnosis not present

## 2012-02-20 DIAGNOSIS — T8189XA Other complications of procedures, not elsewhere classified, initial encounter: Secondary | ICD-10-CM | POA: Diagnosis not present

## 2012-02-20 DIAGNOSIS — M47817 Spondylosis without myelopathy or radiculopathy, lumbosacral region: Secondary | ICD-10-CM | POA: Diagnosis not present

## 2012-02-20 DIAGNOSIS — I509 Heart failure, unspecified: Secondary | ICD-10-CM | POA: Diagnosis not present

## 2012-02-20 NOTE — Progress Notes (Signed)
Subjective:     Patient ID: [name], female   DOB: 19-Aug-1920, 78 y.o.   MRN: [id number]  HPI She is here for another visit and wound check. Her aneurysm is getting slightly larger so she had seen Dr. early for this. No surgery is planned. From an abdominal standpoint she is doing quite well. Her wound continues to heal well. Her inguinal hernia remains asymptomatic  Review of Systems     Objective:   Physical Exam    On exam, her wound continued to contract. I did remove a piece of suture material from the wound. There is no evidence of infection. Her inguinal hernia reduces easily Assessment:     Nonhealing surgical wound    Plan:     I will now changed to wound gel and see her back in one month

## 2012-02-21 DIAGNOSIS — T8189XA Other complications of procedures, not elsewhere classified, initial encounter: Secondary | ICD-10-CM | POA: Diagnosis not present

## 2012-02-21 DIAGNOSIS — M47817 Spondylosis without myelopathy or radiculopathy, lumbosacral region: Secondary | ICD-10-CM | POA: Diagnosis not present

## 2012-02-21 DIAGNOSIS — D649 Anemia, unspecified: Secondary | ICD-10-CM | POA: Diagnosis not present

## 2012-02-21 DIAGNOSIS — F329 Major depressive disorder, single episode, unspecified: Secondary | ICD-10-CM | POA: Diagnosis not present

## 2012-02-21 DIAGNOSIS — I509 Heart failure, unspecified: Secondary | ICD-10-CM | POA: Diagnosis not present

## 2012-02-23 DIAGNOSIS — T8189XA Other complications of procedures, not elsewhere classified, initial encounter: Secondary | ICD-10-CM | POA: Diagnosis not present

## 2012-02-23 DIAGNOSIS — F329 Major depressive disorder, single episode, unspecified: Secondary | ICD-10-CM | POA: Diagnosis not present

## 2012-02-23 DIAGNOSIS — M47817 Spondylosis without myelopathy or radiculopathy, lumbosacral region: Secondary | ICD-10-CM | POA: Diagnosis not present

## 2012-02-23 DIAGNOSIS — D649 Anemia, unspecified: Secondary | ICD-10-CM | POA: Diagnosis not present

## 2012-02-23 DIAGNOSIS — I509 Heart failure, unspecified: Secondary | ICD-10-CM | POA: Diagnosis not present

## 2012-02-24 ENCOUNTER — Telehealth (INDEPENDENT_AMBULATORY_CARE_PROVIDER_SITE_OTHER): Payer: Self-pay | Admitting: General Surgery

## 2012-02-24 DIAGNOSIS — N318 Other neuromuscular dysfunction of bladder: Secondary | ICD-10-CM | POA: Diagnosis not present

## 2012-02-24 DIAGNOSIS — N816 Rectocele: Secondary | ICD-10-CM | POA: Diagnosis not present

## 2012-02-24 DIAGNOSIS — R82998 Other abnormal findings in urine: Secondary | ICD-10-CM | POA: Diagnosis not present

## 2012-02-24 NOTE — Telephone Encounter (Signed)
Lorin Picket, nurse with [name], calling to ask Dr. Magnus Ivan if OK to amend dressing orders.  [name] cannot go out for daily dressing changes with Hydrogel, but can do biweekly dressings with Max-orb.  Please advise him:  Call cell # [phone]

## 2012-02-25 DIAGNOSIS — F329 Major depressive disorder, single episode, unspecified: Secondary | ICD-10-CM | POA: Diagnosis not present

## 2012-02-25 DIAGNOSIS — D649 Anemia, unspecified: Secondary | ICD-10-CM | POA: Diagnosis not present

## 2012-02-25 DIAGNOSIS — I509 Heart failure, unspecified: Secondary | ICD-10-CM | POA: Diagnosis not present

## 2012-02-25 DIAGNOSIS — T8189XA Other complications of procedures, not elsewhere classified, initial encounter: Secondary | ICD-10-CM | POA: Diagnosis not present

## 2012-02-25 DIAGNOSIS — M47817 Spondylosis without myelopathy or radiculopathy, lumbosacral region: Secondary | ICD-10-CM | POA: Diagnosis not present

## 2012-02-25 NOTE — Telephone Encounter (Signed)
That’s fine with me

## 2012-02-27 DIAGNOSIS — F329 Major depressive disorder, single episode, unspecified: Secondary | ICD-10-CM | POA: Diagnosis not present

## 2012-02-27 DIAGNOSIS — T8189XA Other complications of procedures, not elsewhere classified, initial encounter: Secondary | ICD-10-CM | POA: Diagnosis not present

## 2012-02-27 DIAGNOSIS — I509 Heart failure, unspecified: Secondary | ICD-10-CM | POA: Diagnosis not present

## 2012-02-27 DIAGNOSIS — M47817 Spondylosis without myelopathy or radiculopathy, lumbosacral region: Secondary | ICD-10-CM | POA: Diagnosis not present

## 2012-02-27 DIAGNOSIS — D649 Anemia, unspecified: Secondary | ICD-10-CM | POA: Diagnosis not present

## 2012-03-01 DIAGNOSIS — I509 Heart failure, unspecified: Secondary | ICD-10-CM | POA: Diagnosis not present

## 2012-03-01 DIAGNOSIS — F329 Major depressive disorder, single episode, unspecified: Secondary | ICD-10-CM | POA: Diagnosis not present

## 2012-03-01 DIAGNOSIS — D649 Anemia, unspecified: Secondary | ICD-10-CM | POA: Diagnosis not present

## 2012-03-01 DIAGNOSIS — T8189XA Other complications of procedures, not elsewhere classified, initial encounter: Secondary | ICD-10-CM | POA: Diagnosis not present

## 2012-03-01 DIAGNOSIS — M47817 Spondylosis without myelopathy or radiculopathy, lumbosacral region: Secondary | ICD-10-CM | POA: Diagnosis not present

## 2012-03-03 DIAGNOSIS — M25519 Pain in unspecified shoulder: Secondary | ICD-10-CM | POA: Diagnosis not present

## 2012-03-03 DIAGNOSIS — S72143A Displaced intertrochanteric fracture of unspecified femur, initial encounter for closed fracture: Secondary | ICD-10-CM | POA: Diagnosis not present

## 2012-03-04 DIAGNOSIS — F329 Major depressive disorder, single episode, unspecified: Secondary | ICD-10-CM | POA: Diagnosis not present

## 2012-03-04 DIAGNOSIS — T8189XA Other complications of procedures, not elsewhere classified, initial encounter: Secondary | ICD-10-CM | POA: Diagnosis not present

## 2012-03-04 DIAGNOSIS — I509 Heart failure, unspecified: Secondary | ICD-10-CM | POA: Diagnosis not present

## 2012-03-04 DIAGNOSIS — D649 Anemia, unspecified: Secondary | ICD-10-CM | POA: Diagnosis not present

## 2012-03-04 DIAGNOSIS — M47817 Spondylosis without myelopathy or radiculopathy, lumbosacral region: Secondary | ICD-10-CM | POA: Diagnosis not present

## 2012-03-09 DIAGNOSIS — I509 Heart failure, unspecified: Secondary | ICD-10-CM | POA: Diagnosis not present

## 2012-03-09 DIAGNOSIS — F329 Major depressive disorder, single episode, unspecified: Secondary | ICD-10-CM | POA: Diagnosis not present

## 2012-03-09 DIAGNOSIS — T8189XA Other complications of procedures, not elsewhere classified, initial encounter: Secondary | ICD-10-CM | POA: Diagnosis not present

## 2012-03-09 DIAGNOSIS — M47817 Spondylosis without myelopathy or radiculopathy, lumbosacral region: Secondary | ICD-10-CM | POA: Diagnosis not present

## 2012-03-09 DIAGNOSIS — D649 Anemia, unspecified: Secondary | ICD-10-CM | POA: Diagnosis not present

## 2012-03-11 DIAGNOSIS — R5381 Other malaise: Secondary | ICD-10-CM | POA: Diagnosis not present

## 2012-03-11 DIAGNOSIS — I509 Heart failure, unspecified: Secondary | ICD-10-CM | POA: Diagnosis not present

## 2012-03-11 DIAGNOSIS — R5383 Other fatigue: Secondary | ICD-10-CM | POA: Diagnosis not present

## 2012-03-11 DIAGNOSIS — F329 Major depressive disorder, single episode, unspecified: Secondary | ICD-10-CM | POA: Diagnosis not present

## 2012-03-11 DIAGNOSIS — R110 Nausea: Secondary | ICD-10-CM | POA: Diagnosis not present

## 2012-03-11 DIAGNOSIS — K4090 Unilateral inguinal hernia, without obstruction or gangrene, not specified as recurrent: Secondary | ICD-10-CM | POA: Diagnosis not present

## 2012-03-11 DIAGNOSIS — R197 Diarrhea, unspecified: Secondary | ICD-10-CM | POA: Diagnosis not present

## 2012-03-11 DIAGNOSIS — M47817 Spondylosis without myelopathy or radiculopathy, lumbosacral region: Secondary | ICD-10-CM | POA: Diagnosis not present

## 2012-03-11 DIAGNOSIS — D649 Anemia, unspecified: Secondary | ICD-10-CM | POA: Diagnosis not present

## 2012-03-11 DIAGNOSIS — T8189XA Other complications of procedures, not elsewhere classified, initial encounter: Secondary | ICD-10-CM | POA: Diagnosis not present

## 2012-03-18 DIAGNOSIS — K4090 Unilateral inguinal hernia, without obstruction or gangrene, not specified as recurrent: Secondary | ICD-10-CM | POA: Diagnosis not present

## 2012-03-18 DIAGNOSIS — R197 Diarrhea, unspecified: Secondary | ICD-10-CM | POA: Diagnosis not present

## 2012-03-24 ENCOUNTER — Ambulatory Visit (INDEPENDENT_AMBULATORY_CARE_PROVIDER_SITE_OTHER): Payer: Medicare Other | Admitting: Surgery

## 2012-03-24 ENCOUNTER — Encounter (INDEPENDENT_AMBULATORY_CARE_PROVIDER_SITE_OTHER): Payer: Self-pay | Admitting: Surgery

## 2012-03-24 VITALS — BP 132/66 | HR 70 | Temp 98.8°F | Ht 59.5 in | Wt 105.6 lb

## 2012-03-24 DIAGNOSIS — T8189XA Other complications of procedures, not elsewhere classified, initial encounter: Secondary | ICD-10-CM | POA: Diagnosis not present

## 2012-03-24 NOTE — Progress Notes (Signed)
Subjective:     Patient ID: [name], female   DOB: [birth year], 78 y.o.   MRN: [id number]  HPI  She is doing well. She reports her wound has completely healed. Her left inguinal hernia remains minimally symptomatic Review of Systems     Objective:   Physical Exam    On exam, her incision is well-healed. She has easily reducible left inguinal hernia Assessment:     Nonhealing surgical wound now healed    Plan:     I will see her back as needed. She will call if the hernia becomes more symptomatic and we can then again consider repair

## 2012-03-31 DIAGNOSIS — N9489 Other specified conditions associated with female genital organs and menstrual cycle: Secondary | ICD-10-CM | POA: Diagnosis not present

## 2012-03-31 DIAGNOSIS — N390 Urinary tract infection, site not specified: Secondary | ICD-10-CM | POA: Diagnosis not present

## 2012-03-31 DIAGNOSIS — M25519 Pain in unspecified shoulder: Secondary | ICD-10-CM | POA: Diagnosis not present

## 2012-03-31 DIAGNOSIS — Z01419 Encounter for gynecological examination (general) (routine) without abnormal findings: Secondary | ICD-10-CM | POA: Diagnosis not present

## 2012-04-02 DIAGNOSIS — B351 Tinea unguium: Secondary | ICD-10-CM | POA: Diagnosis not present

## 2012-04-02 DIAGNOSIS — M79609 Pain in unspecified limb: Secondary | ICD-10-CM | POA: Diagnosis not present

## 2012-04-05 DIAGNOSIS — T1500XA Foreign body in cornea, unspecified eye, initial encounter: Secondary | ICD-10-CM | POA: Diagnosis not present

## 2012-04-23 DIAGNOSIS — Z950 Presence of cardiac pacemaker: Secondary | ICD-10-CM | POA: Diagnosis not present

## 2012-04-23 DIAGNOSIS — I059 Rheumatic mitral valve disease, unspecified: Secondary | ICD-10-CM | POA: Diagnosis not present

## 2012-04-23 DIAGNOSIS — E785 Hyperlipidemia, unspecified: Secondary | ICD-10-CM | POA: Diagnosis not present

## 2012-04-23 DIAGNOSIS — I447 Left bundle-branch block, unspecified: Secondary | ICD-10-CM | POA: Diagnosis not present

## 2012-04-23 DIAGNOSIS — I714 Abdominal aortic aneurysm, without rupture: Secondary | ICD-10-CM | POA: Diagnosis not present

## 2012-04-23 DIAGNOSIS — I359 Nonrheumatic aortic valve disorder, unspecified: Secondary | ICD-10-CM | POA: Diagnosis not present

## 2012-04-23 DIAGNOSIS — I5022 Chronic systolic (congestive) heart failure: Secondary | ICD-10-CM | POA: Diagnosis not present

## 2012-04-27 DIAGNOSIS — I5022 Chronic systolic (congestive) heart failure: Secondary | ICD-10-CM | POA: Diagnosis not present

## 2012-04-27 DIAGNOSIS — I714 Abdominal aortic aneurysm, without rupture: Secondary | ICD-10-CM | POA: Diagnosis not present

## 2012-04-27 DIAGNOSIS — Z950 Presence of cardiac pacemaker: Secondary | ICD-10-CM | POA: Diagnosis not present

## 2012-04-27 DIAGNOSIS — E785 Hyperlipidemia, unspecified: Secondary | ICD-10-CM | POA: Diagnosis not present

## 2012-04-27 DIAGNOSIS — I447 Left bundle-branch block, unspecified: Secondary | ICD-10-CM | POA: Diagnosis not present

## 2012-04-27 DIAGNOSIS — I059 Rheumatic mitral valve disease, unspecified: Secondary | ICD-10-CM | POA: Diagnosis not present

## 2012-04-27 DIAGNOSIS — I359 Nonrheumatic aortic valve disorder, unspecified: Secondary | ICD-10-CM | POA: Diagnosis not present

## 2012-05-03 DIAGNOSIS — S42023A Displaced fracture of shaft of unspecified clavicle, initial encounter for closed fracture: Secondary | ICD-10-CM | POA: Diagnosis not present

## 2012-05-18 ENCOUNTER — Emergency Department (HOSPITAL_COMMUNITY): Payer: Medicare Other

## 2012-05-18 ENCOUNTER — Inpatient Hospital Stay (HOSPITAL_COMMUNITY)
Admission: EM | Admit: 2012-05-18 | Discharge: 2012-05-22 | DRG: 534 | Disposition: A | Discharge status: Home Health | Payer: Medicare Other | Attending: Internal Medicine | Admitting: Internal Medicine

## 2012-05-18 ENCOUNTER — Encounter (HOSPITAL_COMMUNITY): Payer: Self-pay | Admitting: *Deleted

## 2012-05-18 DIAGNOSIS — S7290XA Unspecified fracture of unspecified femur, initial encounter for closed fracture: Secondary | ICD-10-CM | POA: Diagnosis not present

## 2012-05-18 DIAGNOSIS — Z950 Presence of cardiac pacemaker: Secondary | ICD-10-CM | POA: Diagnosis not present

## 2012-05-18 DIAGNOSIS — F32A Depression, unspecified: Secondary | ICD-10-CM

## 2012-05-18 DIAGNOSIS — I359 Nonrheumatic aortic valve disorder, unspecified: Secondary | ICD-10-CM | POA: Diagnosis present

## 2012-05-18 DIAGNOSIS — Z96659 Presence of unspecified artificial knee joint: Secondary | ICD-10-CM

## 2012-05-18 DIAGNOSIS — D62 Acute posthemorrhagic anemia: Secondary | ICD-10-CM | POA: Diagnosis not present

## 2012-05-18 DIAGNOSIS — F329 Major depressive disorder, single episode, unspecified: Secondary | ICD-10-CM

## 2012-05-18 DIAGNOSIS — F411 Generalized anxiety disorder: Secondary | ICD-10-CM | POA: Diagnosis present

## 2012-05-18 DIAGNOSIS — I509 Heart failure, unspecified: Secondary | ICD-10-CM | POA: Diagnosis not present

## 2012-05-18 DIAGNOSIS — S72009D Fracture of unspecified part of neck of unspecified femur, subsequent encounter for closed fracture with routine healing: Secondary | ICD-10-CM | POA: Diagnosis not present

## 2012-05-18 DIAGNOSIS — I059 Rheumatic mitral valve disease, unspecified: Secondary | ICD-10-CM | POA: Diagnosis not present

## 2012-05-18 DIAGNOSIS — R6889 Other general symptoms and signs: Secondary | ICD-10-CM | POA: Diagnosis not present

## 2012-05-18 DIAGNOSIS — I10 Essential (primary) hypertension: Secondary | ICD-10-CM

## 2012-05-18 DIAGNOSIS — I714 Abdominal aortic aneurysm, without rupture, unspecified: Secondary | ICD-10-CM

## 2012-05-18 DIAGNOSIS — I5022 Chronic systolic (congestive) heart failure: Secondary | ICD-10-CM

## 2012-05-18 DIAGNOSIS — S72453A Displaced supracondylar fracture without intracondylar extension of lower end of unspecified femur, initial encounter for closed fracture: Secondary | ICD-10-CM | POA: Diagnosis not present

## 2012-05-18 DIAGNOSIS — M79609 Pain in unspecified limb: Secondary | ICD-10-CM | POA: Diagnosis not present

## 2012-05-18 DIAGNOSIS — Z043 Encounter for examination and observation following other accident: Secondary | ICD-10-CM | POA: Diagnosis not present

## 2012-05-18 DIAGNOSIS — Z01818 Encounter for other preprocedural examination: Secondary | ICD-10-CM | POA: Diagnosis not present

## 2012-05-18 DIAGNOSIS — F419 Anxiety disorder, unspecified: Secondary | ICD-10-CM

## 2012-05-18 DIAGNOSIS — I959 Hypotension, unspecified: Secondary | ICD-10-CM | POA: Diagnosis not present

## 2012-05-18 DIAGNOSIS — M25569 Pain in unspecified knee: Secondary | ICD-10-CM | POA: Diagnosis not present

## 2012-05-18 DIAGNOSIS — T8189XA Other complications of procedures, not elsewhere classified, initial encounter: Secondary | ICD-10-CM

## 2012-05-18 DIAGNOSIS — S7291XA Unspecified fracture of right femur, initial encounter for closed fracture: Secondary | ICD-10-CM

## 2012-05-18 DIAGNOSIS — E785 Hyperlipidemia, unspecified: Secondary | ICD-10-CM | POA: Diagnosis not present

## 2012-05-18 DIAGNOSIS — W010XXA Fall on same level from slipping, tripping and stumbling without subsequent striking against object, initial encounter: Secondary | ICD-10-CM | POA: Diagnosis present

## 2012-05-18 DIAGNOSIS — S72309A Unspecified fracture of shaft of unspecified femur, initial encounter for closed fracture: Secondary | ICD-10-CM | POA: Diagnosis not present

## 2012-05-18 DIAGNOSIS — S72409A Unspecified fracture of lower end of unspecified femur, initial encounter for closed fracture: Secondary | ICD-10-CM | POA: Diagnosis not present

## 2012-05-18 DIAGNOSIS — I351 Nonrheumatic aortic (valve) insufficiency: Secondary | ICD-10-CM

## 2012-05-18 LAB — BASIC METABOLIC PANEL
BUN: 27 mg/dL — ABNORMAL HIGH (ref 6–23)
Calcium: 9.9 mg/dL (ref 8.4–10.5)
Creatinine, Ser: 0.75 mg/dL (ref 0.50–1.10)
GFR calc non Af Amer: 72 mL/min — ABNORMAL LOW (ref >90)
Glucose, Bld: 114 mg/dL — ABNORMAL HIGH (ref 70–99)
Sodium: 140 mEq/L (ref 135–145)

## 2012-05-18 LAB — CBC WITH DIFFERENTIAL/PLATELET
Eosinophils Absolute: 0.1 10*3/uL (ref 0.0–0.7)
Eosinophils Relative: 1 % (ref 0–5)
HCT: 34 % — ABNORMAL LOW (ref 36.0–46.0)
Lymphs Abs: 2.2 10*3/uL (ref 0.7–4.0)
MCH: 31.5 pg (ref 26.0–34.0)
MCV: 98.3 fL (ref 78.0–100.0)
Monocytes Absolute: 0.9 10*3/uL (ref 0.1–1.0)
Monocytes Relative: 8 % (ref 3–12)
Platelets: 276 10*3/uL (ref 150–400)
RBC: 3.46 MIL/uL — ABNORMAL LOW (ref 3.87–5.11)

## 2012-05-18 MED ORDER — ALUM & MAG HYDROXIDE-SIMETH 200-200-20 MG/5ML PO SUSP: 30.0000 mL | Freq: Four times a day (QID) | ORAL | Status: DC | PRN

## 2012-05-18 MED ORDER — SENNOSIDES-DOCUSATE SODIUM 8.6-50 MG PO TABS
1.0000 | ORAL_TABLET | Freq: Every evening | ORAL | Status: DC | PRN
  Filled 2012-05-18: qty 1

## 2012-05-18 MED ORDER — ONDANSETRON HCL 4 MG PO TABS: 4.0000 mg | ORAL_TABLET | Freq: Four times a day (QID) | ORAL | Status: DC | PRN

## 2012-05-18 MED ORDER — SIMVASTATIN 20 MG PO TABS
20.0000 mg | ORAL_TABLET | Freq: Every day | ORAL | Status: DC
  Administered 2012-05-19 – 2012-05-22 (×4): 20 mg via ORAL
  Filled 2012-05-18 (×4): qty 1

## 2012-05-18 MED ORDER — POTASSIUM CHLORIDE IN NACL 20-0.9 MEQ/L-% IV SOLN
INTRAVENOUS | Status: DC
  Administered 2012-05-19 (×2): via INTRAVENOUS
  Administered 2012-05-21: 50 mL via INTRAVENOUS
  Filled 2012-05-18 (×7): qty 1000

## 2012-05-18 MED ORDER — OXYBUTYNIN CHLORIDE ER 10 MG PO TB24
10.0000 mg | ORAL_TABLET | ORAL | Status: DC
  Administered 2012-05-19 – 2012-05-21 (×2): 10 mg via ORAL
  Filled 2012-05-18 (×2): qty 1

## 2012-05-18 MED ORDER — SODIUM CHLORIDE 0.9 % IJ SOLN
3.0000 mL | Freq: Two times a day (BID) | INTRAMUSCULAR | Status: DC
  Administered 2012-05-18 – 2012-05-22 (×3): 3 mL via INTRAVENOUS

## 2012-05-18 MED ORDER — ENOXAPARIN SODIUM 30 MG/0.3ML ~~LOC~~ SOLN
30.0000 mg | Freq: Every day | SUBCUTANEOUS | Status: DC
  Administered 2012-05-19 (×2): 30 mg via SUBCUTANEOUS
  Filled 2012-05-18 (×3): qty 0.3

## 2012-05-18 MED ORDER — CARVEDILOL 6.25 MG PO TABS
6.2500 mg | ORAL_TABLET | Freq: Two times a day (BID) | ORAL | Status: DC
  Administered 2012-05-19 – 2012-05-22 (×8): 6.25 mg via ORAL
  Filled 2012-05-18 (×9): qty 1

## 2012-05-18 MED ORDER — LOSARTAN POTASSIUM 25 MG PO TABS
25.0000 mg | ORAL_TABLET | Freq: Every day | ORAL | Status: DC
  Administered 2012-05-19 – 2012-05-20 (×2): 25 mg via ORAL
  Filled 2012-05-18 (×3): qty 1

## 2012-05-18 MED ORDER — ONDANSETRON HCL 4 MG/2ML IJ SOLN: 4.0000 mg | Freq: Four times a day (QID) | INTRAMUSCULAR | Status: DC | PRN

## 2012-05-18 NOTE — ED Notes (Signed)
Per EMS: Pt coming from home.  Recently d/c from [facility] following bowel resection.  Fell at home today.  Landed on rt knee obvious swelling.  Pain radiating from knee to hip.  States that the pain is very minimal.  Denies hitting head.  Witnessed by 3 nurses caring for her husband.  Pt states she spun around to talk to someone and lost her balance.

## 2012-05-18 NOTE — ED Notes (Signed)
Patient transported to X-ray 

## 2012-05-18 NOTE — ED Notes (Signed)
Returned from xray

## 2012-05-18 NOTE — ED Notes (Signed)
MD at bedside. 

## 2012-05-18 NOTE — ED Notes (Signed)
Bed:WA12<BR> Expected date:<BR> Expected time:<BR> Means of arrival:<BR> Comments:<BR> ems 

## 2012-05-18 NOTE — H&P (Signed)
PCP:   Georgann Housekeeper, MD   Chief Complaint:  Fall  HPI: This is a rather pleasant 78 year old female who was on the walker, she twisted to converse with her caretaker tripped and had a mechanical fall. She was unable to get up, she was brought to the ER. She did not hit her head, she had no loss of consciousness.  The patient has had 2 major surgeries recently. In August she had a expiratory laparotomy for bowel obstruction and more recently she had a AAA repair. She tolerated procedures well. She resides at home with her husband. 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  Review of Systems:  The patient denies anorexia, fever, weight loss,, vision loss, decreased hearing, hoarseness, chest pain, syncope, dyspnea on exertion, peripheral edema, balance deficits, hemoptysis, abdominal pain, melena, hematochezia, severe indigestion/heartburn, hematuria, incontinence, genital sores, muscle weakness, suspicious skin lesions, transient blindness, difficulty walking, depression, unusual weight change, abnormal bleeding, enlarged lymph nodes, angioedema, and breast masses.  Past Medical History: Past Medical History  Diagnosis Date  . Aortic valve regurgitation   . Left bundle branch block   . Hyperlipidemia   . Mitral valve disorder   . Pacemaker     For complete heart block, s/p generator change 09/2010   . Dyspnea   . Anxiety   . Depression   . Aneurysm of abdominal aorta     4.4 cm in 09/2010  . Closed intertrochanteric fracture of right hip 07/13/2011  . CHF (congestive heart failure)    Past Surgical History  Procedure Date  . Replacement total knee 2008    left by Dr. Marciano Sequin  . Pacemaker insertion 2006    complete heart block  . Insert / replace / remove pacemaker 2012    upgrade to biventricular  . Femur im nail 07/14/2011    Procedure: INTRAMEDULLARY (IM) NAIL FEMORAL;  Surgeon: Eulas Post;  Location: [facility] OR;  Service: Orthopedics;  Laterality: Right;  Synthes  TFN, fracture table, big C arm, available after 4p  . Left ingunial hernia   . Laparotomy 11/11/2011    Procedure: EXPLORATORY LAPAROTOMY;  Surgeon: Shelly Rubenstein, MD;  Location: [facility] OR;  Service: General;  Laterality: N/A;  . Bowel resection 11/11/2011    Procedure: SMALL BOWEL RESECTION;  Surgeon: Shelly Rubenstein, MD;  Location: [facility] OR;  Service: General;  Laterality: N/A;  . Inguinal hernia repair 11/11/2011    Procedure: HERNIA REPAIR INGUINAL ADULT;  Surgeon: Shelly Rubenstein, MD;  Location: [facility] OR;  Service: General;  Laterality: Left;  . Abdominal aortic aneurysm repair     Medications: Prior to Admission medications   Medication Sig Start Date End Date Taking? Authorizing Provider  acetaminophen (TYLENOL) 500 MG tablet Take 500 mg by mouth as needed. For arthritis pain   Yes Historical Provider, MD  aspirin 81 MG tablet Take 81 mg by mouth daily.     Yes Historical Provider, MD  carvedilol (COREG) 6.25 MG tablet Take 6.25 mg by mouth 2 (two) times daily.     Yes Historical Provider, MD  cholecalciferol (VITAMIN D) 1000 UNITS tablet Take 1,000 Units by mouth daily.     Yes Historical Provider, MD  furosemide (LASIX) 20 MG tablet Take 20 mg by mouth 2 (two) times daily.     Yes Historical Provider, MD  Glucosamine 500 MG CAPS Take 3,000 mg by mouth daily. Takes six  500 mg capsules daily.   Yes Historical Provider, MD  Lactobacillus (ACIDOPHILUS  PO) Take 1 tablet by mouth daily.   Yes Historical Provider, MD  losartan (COZAAR) 50 MG tablet Take 25 mg by mouth daily.    Yes Historical Provider, MD  Lutein 20 MG CAPS Take 20 mg by mouth daily.    Yes Historical Provider, MD  Multiple Vitamins-Minerals (MULTIVITAL) tablet Take 1 tablet by mouth daily.    Yes Historical Provider, MD  oxybutynin (DITROPAN-XL) 10 MG 24 hr tablet Take 10 mg by mouth every other day.    Yes Historical Provider, MD  simvastatin (ZOCOR) 20 MG tablet Take 20 mg by mouth daily.    Yes Historical Provider, MD      Allergies:   Allergies  Allergen Reactions  . Ace Inhibitors Other (See Comments)    unknown  . Atorvastatin Other (See Comments)    unknown  . Other     " [facility]" caretaker says it can be "disastrous."    Social History:  reports that she has never smoked. She has never used smokeless tobacco. She reports that she does not drink alcohol or use illicit drugs.  Family History: Family History  Problem Relation Age of Onset  . Other Brother     AAA    Physical Exam: Filed Vitals:   05/18/12 1804  BP: 142/66  Pulse: 71  Temp: 97.8 F (36.6 C)  TempSrc: Oral  SpO2: 100%    General:  Alert and oriented times three, well developed and nourished, no acute distress Eyes: PERRLA, pink conjunctiva, no scleral icterus ENT: Moist oral mucosa, neck supple, no thyromegaly Lungs: clear to ascultation, no wheeze, no crackles, no use of accessory muscles Cardiovascular: regular rate and rhythm, no regurgitation, no gallops, no murmurs. No carotid bruits, no JVD Abdomen: soft, positive BS, non-tender, non-distended, no organomegaly, not an acute abdomen GU: not examined Neuro: CN II - XII grossly intact, sensation intact Musculoskeletal: strength 5/5 all extremities right lower extremity strength not assess, no clubbing, cyanosis or edema Skin: no rash, no subcutaneous crepitation, no decubitus Psych: appropriate patient   Labs on Admission:  No results found for this basename: NA:2,K:2,CL:2,CO2:2,GLUCOSE:2,BUN:2,CREATININE:2,CALCIUM:2,MG:2,PHOS:2 in the last 72 hours No results found for this basename: AST:2,ALT:2,ALKPHOS:2,BILITOT:2,PROT:2,ALBUMIN:2 in the last 72 hours No results found for this basename: LIPASE:2,AMYLASE:2 in the last 72 hours  Basename 05/18/12 2050  WBC 11.1*  NEUTROABS 7.9*  HGB 10.9*  HCT 34.0*  MCV 98.3  PLT 276   No results found for this basename: CKTOTAL:3,CKMB:3,CKMBINDEX:3,TROPONINI:3 in the last 72 hours No components found  with this basename: POCBNP:3 No results found for this basename: DDIMER:2 in the last 72 hours No results found for this basename: HGBA1C:2 in the last 72 hours No results found for this basename: CHOL:2,HDL:2,LDLCALC:2,TRIG:2,CHOLHDL:2,LDLDIRECT:2 in the last 72 hours No results found for this basename: TSH,T4TOTAL,FREET3,T3FREE,THYROIDAB in the last 72 hours No results found for this basename: VITAMINB12:2,FOLATE:2,FERRITIN:2,TIBC:2,IRON:2,RETICCTPCT:2 in the last 72 hours  Micro Results: No results found for this or any previous visit (from the past 240 hour(s)).   Radiological Exams on Admission: Dg Chest 2 View  05/18/2012  *RADIOLOGY REPORT*  Clinical Data: 78 year old female surgical clearance.  CHF.  CHEST - 2 VIEW  Comparison: 11/12/2011 and earlier.  Findings: Upright AP and lateral views of the chest.  Chronic cardiomegaly, but less pronounced than on prior, may reflect interval resolution of a pericardial effusion.  Stable left chest three lead cardiac pacemaker.  Chronically large lung volumes.  No pneumothorax, pulmonary edema, pleural effusion or consolidation. Osteopenia.  Advanced  degenerative changes in the spine.  Calcified atherosclerosis of the aorta.  IMPRESSION: No acute cardiopulmonary abnormality.   Original Report Authenticated By: Harley Hallmark, M.D.    Dg Knee Complete 4 Views Right  05/18/2012  *RADIOLOGY REPORT*  Clinical Data: 78 year old female status post fall with pain.  RIGHT KNEE - COMPLETE 4+ VIEW  Comparison: Intraoperative images 07/14/2011.  Findings: Spiral comminuted fracture of the distal right femur. The proximal aspect of the fracture may not be entirely included on these images.  The fracture terminates at the level of the distal intramedullary rod.  The fracture plane does appear to extend into the patellofemoral joint space, the other knee joint spaces appear spared.  Medial angulation.  Proximal tibia and fibula appear intact.  Calcified  atherosclerosis.  IMPRESSION: Spiral, comminuted fracture of the distal right femoral shaft and metadiaphysis about the intramedullary rod.  Fracture probably does extend into the patellofemoral joint.  Medial angulation of the distal fragment.   Original Report Authenticated By: Harley Hallmark, M.D.     Assessment/Plan Present on Admission:  .Femur fracture, right Admit to telemetry  Orthopedic Dr.  Dion Saucier aware  Patient high risk for surgical intervention, however, she has recently had 2 major surgeries (exploratory lap and AAA repair) she tolerated both procedures well  No pain medications, patient requests  .Biventricular pacemake .Hyperlipidemia .Anxiety .Chronic systolic congestive heart failure .Hypertension Stable home medications resumed    DO NOT RESUSCITATE DVT prophylaxis   Diamante Rubin 05/18/2012, 9:48 PM

## 2012-05-18 NOTE — ED Provider Notes (Addendum)
History     CSN: [id number]  Arrival date & time 05/18/12  1754   First MD Initiated Contact with Patient 05/18/12 1956      Chief Complaint  Patient presents with  . Fall  . Knee Pain  . Hip Pain    (Consider location/radiation/quality/duration/timing/severity/associated sxs/prior treatment) Patient is a 78 y.o. female presenting with leg pain. The history is provided by the patient.  Leg Pain  The incident occurred less than 1 hour ago. The incident occurred at home. The injury mechanism was a fall. The pain is present in the right leg. The quality of the pain is described as throbbing. The pain is at a severity of 5/10. The pain is moderate. The pain has been constant since onset. Associated symptoms include inability to bear weight. The symptoms are aggravated by activity, bearing weight and palpation. She has tried rest for the symptoms. The treatment provided moderate relief.    Past Medical History  Diagnosis Date  . Aortic valve regurgitation   . Left bundle branch block   . Hyperlipidemia   . Mitral valve disorder   . Pacemaker     For complete heart block, s/p generator change 09/2010   . Dyspnea   . Anxiety   . Depression   . Aneurysm of abdominal aorta     4.4 cm in 09/2010  . Closed intertrochanteric fracture of right hip 07/13/2011  . CHF (congestive heart failure)     Past Surgical History  Procedure Date  . Replacement total knee 2008    left by Dr. Marciano Sequin  . Pacemaker insertion 2006    complete heart block  . Insert / replace / remove pacemaker 2012    upgrade to biventricular  . Femur im nail 07/14/2011    Procedure: INTRAMEDULLARY (IM) NAIL FEMORAL;  Surgeon: Eulas Post;  Location: [facility] OR;  Service: Orthopedics;  Laterality: Right;  Synthes TFN, fracture table, big C arm, available after 4p  . Left ingunial hernia   . Laparotomy 11/11/2011    Procedure: EXPLORATORY LAPAROTOMY;  Surgeon: Shelly Rubenstein, MD;  Location: [facility] OR;  Service:  General;  Laterality: N/A;  . Bowel resection 11/11/2011    Procedure: SMALL BOWEL RESECTION;  Surgeon: Shelly Rubenstein, MD;  Location: [facility] OR;  Service: General;  Laterality: N/A;  . Inguinal hernia repair 11/11/2011    Procedure: HERNIA REPAIR INGUINAL ADULT;  Surgeon: Shelly Rubenstein, MD;  Location: [facility] OR;  Service: General;  Laterality: Left;    Family History  Problem Relation Age of Onset  . Other Brother     AAA    History  Substance Use Topics  . Smoking status: Never Smoker   . Smokeless tobacco: Never Used  . Alcohol Use: No    OB History    Grav Para Term Preterm Abortions TAB SAB Ect Mult Living                  Review of Systems  Respiratory: Negative for cough and shortness of breath.   Cardiovascular: Negative for chest pain and leg swelling.  Gastrointestinal: Negative for nausea, vomiting, abdominal pain and diarrhea.  All other systems reviewed and are negative.    Allergies  Ace inhibitors; Atorvastatin; and Other  Home Medications   Current Outpatient Rx  Name Route Sig Dispense Refill  . ACETAMINOPHEN 500 MG PO TABS Oral Take 500 mg by mouth as needed. For arthritis pain    . ASPIRIN 81 MG PO TABS  Oral Take 81 mg by mouth daily.      Marland Kitchen CARVEDILOL 6.25 MG PO TABS Oral Take 6.25 mg by mouth 2 (two) times daily.      Marland Kitchen VITAMIN D 1000 UNITS PO TABS Oral Take 1,000 Units by mouth daily.      . FUROSEMIDE 20 MG PO TABS Oral Take 20 mg by mouth 2 (two) times daily.      Marland Kitchen GLUCOSAMINE 500 MG PO CAPS Oral Take 3,000 mg by mouth daily. Takes six  500 mg capsules daily.    . ACIDOPHILUS PO Oral Take 1 tablet by mouth daily.    Marland Kitchen LOSARTAN POTASSIUM 50 MG PO TABS Oral Take 25 mg by mouth daily.     . LUTEIN 20 MG PO CAPS Oral Take 20 mg by mouth daily.     . MULTIVITAL PO TABS Oral Take 1 tablet by mouth daily.     . OXYBUTYNIN CHLORIDE ER 10 MG PO TB24 Oral Take 10 mg by mouth every other day.     Marland Kitchen SIMVASTATIN 20 MG PO TABS Oral Take 20 mg by mouth  daily.       BP 142/66  Pulse 71  Temp 97.8 F (36.6 C) (Oral)  SpO2 100%  Physical Exam  Nursing note and vitals reviewed. Constitutional: She is oriented to person, place, and time. She appears well-developed and well-nourished. No distress.  HENT:  Head: Normocephalic and atraumatic.  Mouth/Throat: Oropharynx is clear and moist.  Eyes: Conjunctivae normal and EOM are normal. Pupils are equal, round, and reactive to light.  Neck: Normal range of motion. Neck supple.  Cardiovascular: Normal rate, regular rhythm and intact distal pulses.   Murmur heard. Pulmonary/Chest: Effort normal and breath sounds normal. No respiratory distress. She has no wheezes. She has no rales.  Abdominal: Soft. She exhibits no distension. There is no tenderness. There is no rebound and no guarding.       Open healing wound  Musculoskeletal: Normal range of motion. She exhibits no edema and no tenderness.       Right upper leg: She exhibits bony tenderness and deformity.       Legs: Neurological: She is alert and oriented to person, place, and time.  Skin: Skin is warm and dry. No rash noted. No erythema.  Psychiatric: She has a normal mood and affect. Her behavior is normal.    ED Course  Procedures (including critical care time)   Labs Reviewed  CBC WITH DIFFERENTIAL  BASIC METABOLIC PANEL   Dg Chest 2 View  05/18/2012  *RADIOLOGY REPORT*  Clinical Data: 78 year old female surgical clearance.  CHF.  CHEST - 2 VIEW  Comparison: 11/12/2011 and earlier.  Findings: Upright AP and lateral views of the chest.  Chronic cardiomegaly, but less pronounced than on prior, may reflect interval resolution of a pericardial effusion.  Stable left chest three lead cardiac pacemaker.  Chronically large lung volumes.  No pneumothorax, pulmonary edema, pleural effusion or consolidation. Osteopenia.  Advanced degenerative changes in the spine.  Calcified atherosclerosis of the aorta.  IMPRESSION: No acute  cardiopulmonary abnormality.   Original Report Authenticated By: Harley Hallmark, M.D.    Dg Knee Complete 4 Views Right  05/18/2012  *RADIOLOGY REPORT*  Clinical Data: 78 year old female status post fall with pain.  RIGHT KNEE - COMPLETE 4+ VIEW  Comparison: Intraoperative images 07/14/2011.  Findings: Spiral comminuted fracture of the distal right femur. The proximal aspect of the fracture may not be entirely included on these  images.  The fracture terminates at the level of the distal intramedullary rod.  The fracture plane does appear to extend into the patellofemoral joint space, the other knee joint spaces appear spared.  Medial angulation.  Proximal tibia and fibula appear intact.  Calcified atherosclerosis.  IMPRESSION: Spiral, comminuted fracture of the distal right femoral shaft and metadiaphysis about the intramedullary rod.  Fracture probably does extend into the patellofemoral joint.  Medial angulation of the distal fragment.   Original Report Authenticated By: Harley Hallmark, M.D.     Date: 05/18/2012  Rate: 81  Rhythm: normal sinus rhythm  QRS Axis: normal  Intervals: normal  ST/T Wave abnormalities: nonspecific ST/T changes  Conduction Disutrbances:left bundle branch block  Narrative Interpretation:   Old EKG Reviewed: unchanged    1. Fracture of distal femur       MDM   Pt with mechanical fall and unable to walk 2 to right-sided leg pain. On plain film  Patient is found to have spiral comminuted fracture of her distal right femoral shaft and metaphysis just below the intramedullary rod. Findings were discussed with patient's orthopedist Dr. Dion Saucier. He will come and see the patient. Patient will be admitted by internal medicine due to her significant amount of medical problems to see if she is able to endure surgery. She has no other complaints today.        Gwyneth Sprout, MD 05/18/12 2113  Gwyneth Sprout, MD 05/18/12 2115

## 2012-05-18 NOTE — Consult Note (Addendum)
ORTHOPAEDIC CONSULTATION  REQUESTING PHYSICIAN: Gery Pray, MD  Chief Complaint: Right leg pain  HPI: [name] is a 78 y.o. female who complains of  right leg pain after twisting injury. She broke her right hip in 2012, and I performed a intramedullary nail. He healed reasonably well, and got back to an independent ambulatory function, using a cane and a walker, although commonly going without any type of assistive devices. She denied any pain in her hip. She had a mechanical fall today, with a twisting injury, and injured her right leg. Pain is currently rated as mild to moderate, and she is very anxious and concerned regarding the possibility of future surgical intervention, particularly due to the fact that she does not want to put herself at any type of risk for mortality, given the fact that she is trying to continue to be there for her husband who is progressively deteriorating and health.  She denies any other injuries in the fall. She did have a clavicle fracture on the left side since her last hip fracture, which I have been treating her for conservatively, and she has progressed to nonunion, which is minimally symptomatic.  Past Medical History  Diagnosis Date  . Aortic valve regurgitation   . Left bundle branch block   . Hyperlipidemia   . Mitral valve disorder   . Pacemaker     For complete heart block, s/p generator change 09/2010   . Dyspnea   . Anxiety   . Depression   . Aneurysm of abdominal aorta     4.4 cm in 09/2010  . Closed intertrochanteric fracture of right hip 07/13/2011  . CHF (congestive heart failure)    Past Surgical History  Procedure Date  . Replacement total knee 2008    left by Dr. Marciano Sequin  . Pacemaker insertion 2006    complete heart block  . Insert / replace / remove pacemaker 2012    upgrade to biventricular  . Femur im nail 07/14/2011    Procedure: INTRAMEDULLARY (IM) NAIL FEMORAL;  Surgeon: Eulas Post;  Location: [facility] OR;   Service: Orthopedics;  Laterality: Right;  Synthes TFN, fracture table, big C arm, available after 4p  . Left ingunial hernia   . Laparotomy 11/11/2011    Procedure: EXPLORATORY LAPAROTOMY;  Surgeon: Shelly Rubenstein, MD;  Location: [facility] OR;  Service: General;  Laterality: N/A;  . Bowel resection 11/11/2011    Procedure: SMALL BOWEL RESECTION;  Surgeon: Shelly Rubenstein, MD;  Location: [facility] OR;  Service: General;  Laterality: N/A;  . Inguinal hernia repair 11/11/2011    Procedure: HERNIA REPAIR INGUINAL ADULT;  Surgeon: Shelly Rubenstein, MD;  Location: [facility] OR;  Service: General;  Laterality: Left;  . Abdominal aortic aneurysm repair    History   Social History  . Marital Status: Married    Spouse Name: N/A    Number of Children: N/A  . Years of Education: N/A   Social History Main Topics  . Smoking status: Never Smoker   . Smokeless tobacco: Never Used  . Alcohol Use: No  . Drug Use: No  . Sexually Active: None   Other Topics Concern  . None   Social History Narrative   Regular exercise- yes. Retired and married.   Husband has significant disabilities and needs   Family History  Problem Relation Age of Onset  . Other Brother     AAA   Allergies  Allergen Reactions  . Ace Inhibitors Other (See Comments)  unknown  . Atorvastatin Other (See Comments)    unknown  . Other     " Mind Altering Drugs" caretaker says it can be "disastrous."   Prior to Admission medications   Medication Sig Start Date End Date Taking? Authorizing Provider  acetaminophen (TYLENOL) 500 MG tablet Take 500 mg by mouth as needed. For arthritis pain   Yes Historical Provider, MD  aspirin 81 MG tablet Take 81 mg by mouth daily.     Yes Historical Provider, MD  carvedilol (COREG) 6.25 MG tablet Take 6.25 mg by mouth 2 (two) times daily.     Yes Historical Provider, MD  cholecalciferol (VITAMIN D) 1000 UNITS tablet Take 1,000 Units by mouth daily.     Yes Historical Provider, MD  furosemide (LASIX) 20  MG tablet Take 20 mg by mouth 2 (two) times daily.     Yes Historical Provider, MD  Glucosamine 500 MG CAPS Take 3,000 mg by mouth daily. Takes six  500 mg capsules daily.   Yes Historical Provider, MD  Lactobacillus (ACIDOPHILUS PO) Take 1 tablet by mouth daily.   Yes Historical Provider, MD  losartan (COZAAR) 50 MG tablet Take 25 mg by mouth daily.    Yes Historical Provider, MD  Lutein 20 MG CAPS Take 20 mg by mouth daily.    Yes Historical Provider, MD  Multiple Vitamins-Minerals (MULTIVITAL) tablet Take 1 tablet by mouth daily.    Yes Historical Provider, MD  oxybutynin (DITROPAN-XL) 10 MG 24 hr tablet Take 10 mg by mouth every other day.    Yes Historical Provider, MD  simvastatin (ZOCOR) 20 MG tablet Take 20 mg by mouth daily.    Yes Historical Provider, MD   Dg Chest 2 View  05/18/2012  *RADIOLOGY REPORT*  Clinical Data: 78 year old female surgical clearance.  CHF.  CHEST - 2 VIEW  Comparison: 11/12/2011 and earlier.  Findings: Upright AP and lateral views of the chest.  Chronic cardiomegaly, but less pronounced than on prior, may reflect interval resolution of a pericardial effusion.  Stable left chest three lead cardiac pacemaker.  Chronically large lung volumes.  No pneumothorax, pulmonary edema, pleural effusion or consolidation. Osteopenia.  Advanced degenerative changes in the spine.  Calcified atherosclerosis of the aorta.  IMPRESSION: No acute cardiopulmonary abnormality.   Original Report Authenticated By: Harley Hallmark, M.D.    Dg Knee Complete 4 Views Right  05/18/2012  *RADIOLOGY REPORT*  Clinical Data: 78 year old female status post fall with pain.  RIGHT KNEE - COMPLETE 4+ VIEW  Comparison: Intraoperative images 07/14/2011.  Findings: Spiral comminuted fracture of the distal right femur. The proximal aspect of the fracture may not be entirely included on these images.  The fracture terminates at the level of the distal intramedullary rod.  The fracture plane does appear to extend  into the patellofemoral joint space, the other knee joint spaces appear spared.  Medial angulation.  Proximal tibia and fibula appear intact.  Calcified atherosclerosis.  IMPRESSION: Spiral, comminuted fracture of the distal right femoral shaft and metadiaphysis about the intramedullary rod.  Fracture probably does extend into the patellofemoral joint.  Medial angulation of the distal fragment.   Original Report Authenticated By: Harley Hallmark, M.D.     Positive ROS: All other systems have been reviewed and were otherwise negative with the exception of those mentioned in the HPI and as above.  Physical Exam: General: Alert, no acute distress moderately frail and cachectic, but appropriate with me throughout the interaction. Cardiovascular: Mild bilateral pedal  edema Respiratory: No cyanosis, no use of accessory musculature GI: No organomegaly, abdomen is soft and non-tender Skin: No lesions in the area of chief complaint, no evidence for open fracture. She does have ecchymosis and bruising and swelling around the right leg. Neurologic: Sensation intact distally Psychiatric: Patient is competent for consent with normal mood and affect Lymphatic: No axillary or cervical lymphadenopathy  MUSCULOSKELETAL: Right leg is mildly shortened, as well as some angulation. EHL and FHL are firing. Her thigh compartment is soft. She cannot do a straight leg raise.  Assessment: Right distal femur periprosthetic fracture, status post intramedullary nail. Moderate to severe osteoporosis with multiple fractures in the past year.  Plan: This is an acute severe injury, and I've had a long discussion with the patient and her family regarding the options. We have discussed the nonsurgical options, which would include bracing, limited function, and ultimately may or may not yield the capacity to regain ambulation. This also has moderate to severe risks of the development of bedsores, pneumonia, blood clots, etc. We  also discussed the risks of nonunion, as well as malunion, leg shortening, among others.  We also discussed the surgical options including ORIF, which may require removal of the intramedullary nail, depending on if enough healing has occurred at her hip fracture. Alternatively, this could include plate fixation using cerclage wires and retention of the intramedullary nail. [name] is very clear on her wishes that she does not want to have any further surgery, as she wants to basically give up her ambulatory function in order to minimize her risks of surgical mortality, in order to stay alive for her husband.  Therefore we will plan for conservative care, I am going to get followup x-rays of her hip as well as full-length pictures of her femur so we have the full picture, and plan to apply a knee immobilizer for added stability and hopefully comfort. I will plan to followup with her while she is in the hospital and thereafter, and I've counseled the family that they could decide for surgery anytime in the next 2-4 weeks, but in the meantime we will monitor her hemoglobin to make sure that she does not require a blood transfusion. I would expect a fairly significant amount of blood loss into her thigh, even without surgery, based on the fracture configuration. Having said that, she is okay for anticoagulation from my standpoint, and I will defer to the primary service regarding this decision of type of anticoagulation.  She also may benefit from palliative care services, and I will defer to the primary team regarding this consultation as well.    Gretel Cantu P, MD Cell [phone] Pager [phone]  05/18/2012 10:14 PM

## 2012-05-19 DIAGNOSIS — I5022 Chronic systolic (congestive) heart failure: Secondary | ICD-10-CM

## 2012-05-19 DIAGNOSIS — I509 Heart failure, unspecified: Secondary | ICD-10-CM

## 2012-05-19 DIAGNOSIS — D62 Acute posthemorrhagic anemia: Secondary | ICD-10-CM

## 2012-05-19 DIAGNOSIS — I959 Hypotension, unspecified: Secondary | ICD-10-CM

## 2012-05-19 LAB — BASIC METABOLIC PANEL
CO2: 26 mEq/L (ref 19–32)
Chloride: 106 mEq/L (ref 96–112)
GFR calc non Af Amer: 49 mL/min — ABNORMAL LOW (ref >90)
Glucose, Bld: 153 mg/dL — ABNORMAL HIGH (ref 70–99)
Potassium: 4.2 mEq/L (ref 3.5–5.1)
Sodium: 140 mEq/L (ref 135–145)

## 2012-05-19 LAB — CBC
HCT: 26 % — ABNORMAL LOW (ref 36.0–46.0)
Hemoglobin: 8.6 g/dL — ABNORMAL LOW (ref 12.0–15.0)
MCH: 32.1 pg (ref 26.0–34.0)
RBC: 2.65 MIL/uL — ABNORMAL LOW (ref 3.87–5.11)

## 2012-05-19 LAB — PROTIME-INR: Prothrombin Time: 14.8 seconds (ref 11.6–15.2)

## 2012-05-19 LAB — PREPARE RBC (CROSSMATCH)

## 2012-05-19 MED ORDER — ENSURE COMPLETE PO LIQD
237.0000 mL | Freq: Every day | ORAL | Status: DC
  Administered 2012-05-20: 237 mL via ORAL

## 2012-05-19 MED ORDER — KETOROLAC TROMETHAMINE 30 MG/ML IJ SOLN
30.0000 mg | Freq: Once | INTRAMUSCULAR | Status: AC
  Administered 2012-05-19: 30 mg via INTRAVENOUS
  Filled 2012-05-19: qty 1

## 2012-05-19 MED ORDER — ACETAMINOPHEN 325 MG PO TABS
650.0000 mg | ORAL_TABLET | ORAL | Status: DC | PRN
  Administered 2012-05-20 – 2012-05-22 (×3): 650 mg via ORAL
  Filled 2012-05-19 (×3): qty 2

## 2012-05-19 MED ORDER — DIPHENHYDRAMINE HCL 25 MG PO CAPS
25.0000 mg | ORAL_CAPSULE | Freq: Once | ORAL | Status: AC
  Administered 2012-05-19: 25 mg via ORAL
  Filled 2012-05-19: qty 1

## 2012-05-19 NOTE — Progress Notes (Signed)
Nutrition Brief Note  Patient identified on the Malnutrition Screening Tool (MST) report for unintended weight loss and poor appetite, generating a score of 3.   Body mass index is 21.62 kg/(m^2). Pt meets criteria for normal healthy based on current BMI.   - Pt reports good appetite PTA, eating at least 2 meals/day and drinking 1 Ensure daily - will order during admission. Noted pt's weight improved since earlier in the year. Pt without any reported problems chewing or swallowing.   Wt Readings from Last 5 Encounters:  05/19/12 114 lb 6.4 oz (51.891 kg)  03/24/12 105 lb 9.6 oz (47.9 kg)  02/03/12 107 lb (48.535 kg)  01/23/12 103 lb 9.6 oz (46.993 kg)  12/25/11 109 lb 9.6 oz (49.714 kg)   No nutrition interventions warranted at this time. If nutrition issues arise, please consult RD.   Levon Hedger MS, RD, LDN [phone] Pager [phone] After Hours Pager

## 2012-05-19 NOTE — Progress Notes (Addendum)
[facility]             Progress Note   Subjective: No complaints of pain to her leg. Is adamant about not wanting surgery because she "has to be there for her elderly husband".  Objective: Vital signs in last 24 hours: Temp:  [97.8 F (36.6 C)-100.5 F (38.1 C)] 100.5 F (38.1 C) (09/25 1300) Pulse Rate:  [71-87] 82  (09/25 1300) Resp:  [18-20] 18  (09/25 1300) BP: (87-162)/(45-70) 98/48 mmHg (09/25 1359) SpO2:  [93 %-100 %] 93 % (09/25 1300) Weight:  [51.891 kg (114 lb 6.4 oz)] 51.891 kg (114 lb 6.4 oz) (09/25 0611) Weight change:  Last BM Date: 05/18/12  Intake/Output from previous day: 09/24 0701 - 09/25 0700 In: 316.7 [I.V.:316.7] Out: -      Physical Exam: General: Alert, awake, oriented x3, in no acute distress. HEENT: No bruits, no goiter. Heart: Regular rate and rhythm, without murmurs, rubs, gallops. Lungs: Clear to auscultation bilaterally. Abdomen: Soft, nontender, nondistended, positive bowel sounds. Extremities: No clubbing cyanosis or edema with positive pedal pulses, right leg in brace. Neuro: Grossly intact, nonfocal.    Lab Results: Basic Metabolic Panel:  Basename 05/19/12 0440 05/18/12 2050  NA 140 140  K 4.2 3.6  CL 106 104  CO2 26 25  GLUCOSE 153* 114*  BUN 30* 27*  CREATININE 0.99 0.75  CALCIUM 8.7 9.9  MG -- --  PHOS -- --   CBC:  Basename 05/19/12 0440 05/18/12 2050  WBC 7.0 11.1*  NEUTROABS -- 7.9*  HGB 8.6* 10.9*  HCT 26.0* 34.0*  MCV 98.1 98.3  PLT 198 276   Coagulation:  Basename 05/19/12 0440  LABPROT 14.8  INR 1.18    Studies/Results: Dg Chest 2 View  05/18/2012  *RADIOLOGY REPORT*  Clinical Data: 78 year old female surgical clearance.  CHF.  CHEST - 2 VIEW  Comparison: 11/12/2011 and earlier.  Findings: Upright AP and lateral views of the chest.  Chronic cardiomegaly, but less pronounced than on prior, may reflect interval resolution of a pericardial effusion.  Stable left chest three lead cardiac  pacemaker.  Chronically large lung volumes.  No pneumothorax, pulmonary edema, pleural effusion or consolidation. Osteopenia.  Advanced degenerative changes in the spine.  Calcified atherosclerosis of the aorta.  IMPRESSION: No acute cardiopulmonary abnormality.   Original Report Authenticated By: Harley Hallmark, M.D.    Dg Pelvis 1-2 Views  05/18/2012  *RADIOLOGY REPORT*  Clinical Data: Fall.  Evaluate right hip fracture healing.  PELVIS - 1-2 VIEW  Comparison: 08/13/2011.  Findings: The right intertrochanteric hip fracture appears healed status post antegrade femoral nail and gamma nail fixation. Diffuse osteopenia.  Obturator rings appear intact. Atherosclerosis.  IMPRESSION: Healed intertrochanteric right femur fracture status post gamma nail fixation.   Original Report Authenticated By: Andreas Newport, M.D.    Dg Femur Right  05/18/2012  *RADIOLOGY REPORT*  Clinical Data: Right femur fracture.  RIGHT FEMUR - 2 VIEW  Comparison: 05/18/2012.  Findings: Antegrade right femoral nail with gamma nail fixation of the right hip.  There is an oblique fracture of the distal right femoral diaphysis extending into the metaphysis.  This is a periprosthetic fracture. Distal interlocking screw in the right femoral nail follows the lateral fracture fragment.  There appears to be a butterfly fragment in the medial metadiaphysis.  Knee osteoarthritis is incidentally noted.  There is mild medial displacement of the distal femur relative to the proximal femoral shaft.  Diffuse osteopenia.  On the lateral view, there  is mild apex anterior angulation and one cortex width anterior displacement of the distal femur.  Atherosclerosis is present.  IMPRESSION: Periprosthetic fracture of the distal femoral metadiaphysis. Probable medial butterfly fragment.  Proximal intertrochanteric fracture appears healed.   Original Report Authenticated By: Andreas Newport, M.D.    Dg Knee Complete 4 Views Right  05/18/2012  *RADIOLOGY REPORT*   Clinical Data: 78 year old female status post fall with pain.  RIGHT KNEE - COMPLETE 4+ VIEW  Comparison: Intraoperative images 07/14/2011.  Findings: Spiral comminuted fracture of the distal right femur. The proximal aspect of the fracture may not be entirely included on these images.  The fracture terminates at the level of the distal intramedullary rod.  The fracture plane does appear to extend into the patellofemoral joint space, the other knee joint spaces appear spared.  Medial angulation.  Proximal tibia and fibula appear intact.  Calcified atherosclerosis.  IMPRESSION: Spiral, comminuted fracture of the distal right femoral shaft and metadiaphysis about the intramedullary rod.  Fracture probably does extend into the patellofemoral joint.  Medial angulation of the distal fragment.   Original Report Authenticated By: Harley Hallmark, M.D.     Medications: Scheduled Meds:    . carvedilol  6.25 mg Oral BID  . enoxaparin (LOVENOX) injection  30 mg Subcutaneous QHS  . feeding supplement  237 mL Oral Q1400  . ketorolac  30 mg Intravenous Once  . losartan  25 mg Oral Daily  . oxybutynin  10 mg Oral QODAY  . simvastatin  20 mg Oral Daily  . sodium chloride  3 mL Intravenous Q12H   Continuous Infusions:    . 0.9 % NaCl with KCl 20 mEq / L 50 mL/hr at 05/19/12 0002   PRN Meds:.acetaminophen, alum & mag hydroxide-simeth, ondansetron (ZOFRAN) IV, ondansetron, senna-docusate  Assessment/Plan:  Principal Problem:  *Femur fracture, right Active Problems:  Anemia due to blood loss, acute  Biventricular pacemake  Chronic systolic congestive heart failure  Hyperlipidemia  Anxiety  Hypertension  Hypotension   Right Femur Fracture -Seen in consultation by Dr. Dion Saucier. -She has decided to forego surgery, risking possibility of chronically being bedbound in order to reduce her surgical morbidity/mortality. -Plan as per Dr. Dion Saucier. -Is PT indicated? What is her weight-bearing status? -Ortho:  Please determine appropriate anticoagulation.  ABLA -2/2 femur fracture. -Will transfuse 1 unit of PRBCs. -Follow Hb. -Will hold anticoagulation today.  Disposition -Transfusion today. -Await final recs by Dr. Dion Saucier. -There are not interested in SNF and have 24 hour care arranged for at home. -Possibly DC home in am. -Dr. Eula Listen to assume care of this patient on 9/26.  Time spent coordinating care: 35 minutes   LOS: 1 day   [facility] Pager: [phone] 05/19/2012, 2:26 PM

## 2012-05-20 ENCOUNTER — Encounter (HOSPITAL_COMMUNITY): Payer: Self-pay | Admitting: *Deleted

## 2012-05-20 LAB — BASIC METABOLIC PANEL
Chloride: 107 mEq/L (ref 96–112)
GFR calc Af Amer: 82 mL/min — ABNORMAL LOW (ref >90)
Potassium: 4.1 mEq/L (ref 3.5–5.1)

## 2012-05-20 LAB — CBC
HCT: 24.6 % — ABNORMAL LOW (ref 36.0–46.0)
Hemoglobin: 8.1 g/dL — ABNORMAL LOW (ref 12.0–15.0)
Hemoglobin: 8.2 g/dL — ABNORMAL LOW (ref 12.0–15.0)
RBC: 2.58 MIL/uL — ABNORMAL LOW (ref 3.87–5.11)
WBC: 7.1 10*3/uL (ref 4.0–10.5)

## 2012-05-20 LAB — URINALYSIS, ROUTINE W REFLEX MICROSCOPIC
Glucose, UA: NEGATIVE mg/dL
Protein, ur: NEGATIVE mg/dL
pH: 5 (ref 5.0–8.0)

## 2012-05-20 LAB — URINE MICROSCOPIC-ADD ON

## 2012-05-20 LAB — PREPARE RBC (CROSSMATCH)

## 2012-05-20 MED ORDER — FUROSEMIDE 10 MG/ML IJ SOLN
20.0000 mg | Freq: Once | INTRAMUSCULAR | Status: AC
  Administered 2012-05-20: 20 mg via INTRAVENOUS
  Filled 2012-05-20: qty 2

## 2012-05-20 MED ORDER — FUROSEMIDE 20 MG PO TABS
20.0000 mg | ORAL_TABLET | Freq: Every day | ORAL | Status: DC
  Administered 2012-05-20 – 2012-05-22 (×3): 20 mg via ORAL
  Filled 2012-05-20 (×3): qty 1

## 2012-05-20 MED ORDER — DIPHENHYDRAMINE HCL 25 MG PO CAPS: 25.0000 mg | ORAL_CAPSULE | ORAL | Status: DC | PRN

## 2012-05-20 MED ORDER — ENOXAPARIN SODIUM 40 MG/0.4ML ~~LOC~~ SOLN
40.0000 mg | Freq: Every day | SUBCUTANEOUS | Status: DC
  Administered 2012-05-20: 40 mg via SUBCUTANEOUS
  Filled 2012-05-20 (×2): qty 0.4

## 2012-05-20 MED ORDER — DOCUSATE SODIUM 100 MG PO CAPS
100.0000 mg | ORAL_CAPSULE | Freq: Two times a day (BID) | ORAL | Status: DC
  Administered 2012-05-20 – 2012-05-22 (×2): 100 mg via ORAL
  Filled 2012-05-20 (×6): qty 1

## 2012-05-20 NOTE — Progress Notes (Signed)
Patient ID: [name], female   DOB: 01/30/1920, 78 y.o.   MRN: [id number]     Subjective:  Patient reports pain as mild to moderate.  Patient is alert and aware of time and place.  Aware of her injuries and elects for closed management.  Objective:   VITALS:   Filed Vitals:   05/20/12 1702 05/20/12 1715 05/20/12 1810 05/20/12 1925  BP: 119/47 113/45 127/49 108/42  Pulse: 81 84 80 81  Temp: 99.7 F (37.6 C) 99.2 F (37.3 C) 98.8 F (37.1 C) 98.3 F (36.8 C)  TempSrc: Oral Oral Oral Oral  Resp: 16 18 18 18   Height:      Weight:      SpO2:  95% 93%     ABD soft Sensation intact distally Dorsiflexion/Plantar flexion intact  LABS  Results for orders placed during the hospital encounter of 05/18/12 (from the past 24 hour(s))  BASIC METABOLIC PANEL     Status: Abnormal   Collection Time   05/20/12  4:45 AM      Component Value Range   Sodium 136  135 - 145 mEq/L   Potassium 4.1  3.5 - 5.1 mEq/L   Chloride 107  96 - 112 mEq/L   CO2 24  19 - 32 mEq/L   Glucose, Bld 123 (*) 70 - 99 mg/dL   BUN 27 (*) 6 - 23 mg/dL   Creatinine, Ser 4.69  0.50 - 1.10 mg/dL   Calcium 7.8 (*) 8.4 - 10.5 mg/dL   GFR calc non Af Amer 71 (*) >90 mL/min   GFR calc Af Amer 82 (*) >90 mL/min  CBC     Status: Abnormal   Collection Time   05/20/12  4:45 AM      Component Value Range   WBC 7.1  4.0 - 10.5 K/uL   RBC 2.58 (*) 3.87 - 5.11 MIL/uL   Hemoglobin 8.1 (*) 12.0 - 15.0 g/dL   HCT 62.9 (*) 52.8 - 41.3 %   MCV 95.3  78.0 - 100.0 fL   MCH 31.4  26.0 - 34.0 pg   MCHC 32.9  30.0 - 36.0 g/dL   RDW 24.4 (*) 01.0 - 27.2 %   Platelets 154  150 - 400 K/uL  CBC     Status: Abnormal   Collection Time   05/20/12  1:00 PM      Component Value Range   WBC 6.6  4.0 - 10.5 K/uL   RBC 2.58 (*) 3.87 - 5.11 MIL/uL   Hemoglobin 8.2 (*) 12.0 - 15.0 g/dL   HCT 53.6 (*) 64.4 - 03.4 %   MCV 95.3  78.0 - 100.0 fL   MCH 31.8  26.0 - 34.0 pg   MCHC 33.3  30.0 - 36.0 g/dL   RDW 74.2 (*) 59.5 - 63.8 %     Platelets 148 (*) 150 - 400 K/uL  URINALYSIS, ROUTINE W REFLEX MICROSCOPIC     Status: Abnormal   Collection Time   05/20/12  1:51 PM      Component Value Range   Color, Urine YELLOW  YELLOW   APPearance CLOUDY (*) CLEAR   Specific Gravity, Urine 1.017  1.005 - 1.030   pH 5.0  5.0 - 8.0   Glucose, UA NEGATIVE  NEGATIVE mg/dL   Hgb urine dipstick NEGATIVE  NEGATIVE   Bilirubin Urine NEGATIVE  NEGATIVE   Ketones, ur NEGATIVE  NEGATIVE mg/dL   Protein, ur NEGATIVE  NEGATIVE mg/dL   Urobilinogen, UA 0.2  0.0 - 1.0 mg/dL   Nitrite NEGATIVE  NEGATIVE   Leukocytes, UA SMALL (*) NEGATIVE  URINE MICROSCOPIC-ADD ON     Status: Abnormal   Collection Time   05/20/12  1:51 PM      Component Value Range   Squamous Epithelial / LPF FEW (*) RARE   WBC, UA 3-6  <3 WBC/hpf   Bacteria, UA RARE  RARE  PREPARE RBC (CROSSMATCH)     Status: Normal   Collection Time   05/20/12  3:00 PM      Component Value Range   Order Confirmation ORDER PROCESSED BY BLOOD BANK      Dg Chest 2 View  05/18/2012  *RADIOLOGY REPORT*  Clinical Data: 78 year old female surgical clearance.  CHF.  CHEST - 2 VIEW  Comparison: 11/12/2011 and earlier.  Findings: Upright AP and lateral views of the chest.  Chronic cardiomegaly, but less pronounced than on prior, may reflect interval resolution of a pericardial effusion.  Stable left chest three lead cardiac pacemaker.  Chronically large lung volumes.  No pneumothorax, pulmonary edema, pleural effusion or consolidation. Osteopenia.  Advanced degenerative changes in the spine.  Calcified atherosclerosis of the aorta.  IMPRESSION: No acute cardiopulmonary abnormality.   Original Report Authenticated By: Harley Hallmark, M.D.    Dg Pelvis 1-2 Views  05/18/2012  *RADIOLOGY REPORT*  Clinical Data: Fall.  Evaluate right hip fracture healing.  PELVIS - 1-2 VIEW  Comparison: 08/13/2011.  Findings: The right intertrochanteric hip fracture appears healed status post antegrade femoral nail and  gamma nail fixation. Diffuse osteopenia.  Obturator rings appear intact. Atherosclerosis.  IMPRESSION: Healed intertrochanteric right femur fracture status post gamma nail fixation.   Original Report Authenticated By: Andreas Newport, M.D.    Dg Femur Right  05/18/2012  *RADIOLOGY REPORT*  Clinical Data: Right femur fracture.  RIGHT FEMUR - 2 VIEW  Comparison: 05/18/2012.  Findings: Antegrade right femoral nail with gamma nail fixation of the right hip.  There is an oblique fracture of the distal right femoral diaphysis extending into the metaphysis.  This is a periprosthetic fracture. Distal interlocking screw in the right femoral nail follows the lateral fracture fragment.  There appears to be a butterfly fragment in the medial metadiaphysis.  Knee osteoarthritis is incidentally noted.  There is mild medial displacement of the distal femur relative to the proximal femoral shaft.  Diffuse osteopenia.  On the lateral view, there is mild apex anterior angulation and one cortex width anterior displacement of the distal femur.  Atherosclerosis is present.  IMPRESSION: Periprosthetic fracture of the distal femoral metadiaphysis. Probable medial butterfly fragment.  Proximal intertrochanteric fracture appears healed.   Original Report Authenticated By: Andreas Newport, M.D.     Assessment/Plan:     Principal Problem:  *Femur fracture, right Active Problems:  Biventricular pacemake  Chronic systolic congestive heart failure  Hyperlipidemia  Anxiety  Hypertension  Hypotension  Anemia due to blood loss, acute   Advance diet Up with therapy NWB right lower ext. Continue knee immobilizer Continue plan per medicine. Discussed her fracture again with her and her brother, and she is still inclined towards non-op care.  Will continue to follow.   Kirsta Probert P 05/20/2012, 7:43 PM   Teryl Lucy, MD Cell [phone] Pager [phone]

## 2012-05-20 NOTE — Clinical Social Work Psychosocial (Unsigned)
     Clinical Social Work Department BRIEF PSYCHOSOCIAL ASSESSMENT 05/20/2012  Patient:  [name], [name]     Account Number:  [id number]     Admit date:  05/18/2012  Clinical Social Worker:  Hattie Perch  Date/Time:  05/20/2012 12:00 M  Referred by:  Physician  Date Referred:  05/20/2012 Referred for  SNF Placement   Other Referral:   Interview type:  Patient Other interview type:    PSYCHOSOCIAL DATA Living Status:  HUSBAND Admitted from facility:   Level of care:   Primary support name:  Angeleigh Chiasson Primary support relationship to patient:  CHILD, ADULT Degree of support available:   good    CURRENT CONCERNS Current Concerns  Post-Acute Placement   Other Concerns:    SOCIAL WORK ASSESSMENT / PLAN CSW met with patient. patient is alert and oriented X3. patient in need of probable SNF placement. patient has previously been at [facility] and is agreeable to CSW seeking placement there.   Assessment/plan status:   Other assessment/ plan:   Information/referral to community resources:    PATIENTS/FAMILYS RESPONSE TO PLAN OF CARE: patient agreeable to seeking if [facility] has a bed available.

## 2012-05-20 NOTE — Progress Notes (Signed)
PT Cancellation Note  PT orders received.  Pt is on "strict bedrest".  She will need to be off of bedrest for [place] to proceed with our evaluation.  We also need to know the WB status of her right leg as we will have to assume it is NWB until otherwise specified.    Rollene Rotunda Susie Ehresman, PT, DPT [phone] 05/20/2012, 1:58 PM

## 2012-05-20 NOTE — Progress Notes (Signed)
Subjective: Pt still not decided about surgery Still not want any surgical intervention-  Want to be with her husband- worried about him Pain ok HBG 8.1-  Received 1 unit blood yesterday   Objective: Vital signs in last 24 hours: Temp:  [99.2 F (37.3 C)-100.5 F (38.1 C)] 99.2 F (37.3 C) (09/26 0505) Pulse Rate:  [82-98] 85  (09/26 0505) Resp:  [18] 18  (09/26 0505) BP: (87-138)/(41-61) 138/41 mmHg (09/26 0505) SpO2:  [93 %-95 %] 94 % (09/26 0505) Weight change:  Last BM Date: 05/18/12  Intake/Output from previous day: 09/25 0701 - 09/26 0700 In: 1801.7 [P.O.:20; I.V.:1431.7; Blood:350] Out: 200 [Urine:200] Intake/Output this shift:    General appearance: alert Resp: clear to auscultation bilaterally Cardio: regular rate and rhythm and 2nd systolic murmur: early systolic 2/6, crescendo at apex Extremities: edema at right leg  Lab Results:  [facility] 05/20/12 0445 05/19/12 0440  WBC 7.1 7.0  HGB 8.1* 8.6*  HCT 24.6* 26.0*  PLT 154 198   BMET  Basename 05/20/12 0445 05/19/12 0440  NA 136 140  K 4.1 4.2  CL 107 106  CO2 24 26  GLUCOSE 123* 153*  BUN 27* 30*  CREATININE 0.79 0.99  CALCIUM 7.8* 8.7    Studies/Results: Dg Chest 2 View  05/18/2012  *RADIOLOGY REPORT*  Clinical Data: 78 year old female surgical clearance.  CHF.  CHEST - 2 VIEW  Comparison: 11/12/2011 and earlier.  Findings: Upright AP and lateral views of the chest.  Chronic cardiomegaly, but less pronounced than on prior, may reflect interval resolution of a pericardial effusion.  Stable left chest three lead cardiac pacemaker.  Chronically large lung volumes.  No pneumothorax, pulmonary edema, pleural effusion or consolidation. Osteopenia.  Advanced degenerative changes in the spine.  Calcified atherosclerosis of the aorta.  IMPRESSION: No acute cardiopulmonary abnormality.   Original Report Authenticated By: Harley Hallmark, M.D.    Dg Pelvis 1-2 Views  05/18/2012  *RADIOLOGY REPORT*  Clinical  Data: Fall.  Evaluate right hip fracture healing.  PELVIS - 1-2 VIEW  Comparison: 08/13/2011.  Findings: The right intertrochanteric hip fracture appears healed status post antegrade femoral nail and gamma nail fixation. Diffuse osteopenia.  Obturator rings appear intact. Atherosclerosis.  IMPRESSION: Healed intertrochanteric right femur fracture status post gamma nail fixation.   Original Report Authenticated By: Andreas Newport, M.D.    Dg Femur Right  05/18/2012  *RADIOLOGY REPORT*  Clinical Data: Right femur fracture.  RIGHT FEMUR - 2 VIEW  Comparison: 05/18/2012.  Findings: Antegrade right femoral nail with gamma nail fixation of the right hip.  There is an oblique fracture of the distal right femoral diaphysis extending into the metaphysis.  This is a periprosthetic fracture. Distal interlocking screw in the right femoral nail follows the lateral fracture fragment.  There appears to be a butterfly fragment in the medial metadiaphysis.  Knee osteoarthritis is incidentally noted.  There is mild medial displacement of the distal femur relative to the proximal femoral shaft.  Diffuse osteopenia.  On the lateral view, there is mild apex anterior angulation and one cortex width anterior displacement of the distal femur.  Atherosclerosis is present.  IMPRESSION: Periprosthetic fracture of the distal femoral metadiaphysis. Probable medial butterfly fragment.  Proximal intertrochanteric fracture appears healed.   Original Report Authenticated By: Andreas Newport, M.D.    Dg Knee Complete 4 Views Right  05/18/2012  *RADIOLOGY REPORT*  Clinical Data: 78 year old female status post fall with pain.  RIGHT KNEE - COMPLETE 4+ VIEW  Comparison: Intraoperative  images 07/14/2011.  Findings: Spiral comminuted fracture of the distal right femur. The proximal aspect of the fracture may not be entirely included on these images.  The fracture terminates at the level of the distal intramedullary rod.  The fracture plane does  appear to extend into the patellofemoral joint space, the other knee joint spaces appear spared.  Medial angulation.  Proximal tibia and fibula appear intact.  Calcified atherosclerosis.  IMPRESSION: Spiral, comminuted fracture of the distal right femoral shaft and metadiaphysis about the intramedullary rod.  Fracture probably does extend into the patellofemoral joint.  Medial angulation of the distal fragment.   Original Report Authenticated By: Harley Hallmark, M.D.     Medications: I have reviewed the patient's current medications.  Assessment/Plan: Right femur Fx Ortho following-  No surgical intervention Immobilizer / pain control Discuss with her; in detail her wishes- will talk with the daughter and son Will be beneficial for SNF - she is agreeable for know Although has 24 hr care for her husband now Anemia- recheck CBC- if still low will tranfuse- bleeding due to fx Low grade fever- WBC ok Check urine CHF/ MR- stable lasix 20 mg restart HTN: BP ok anxeity- low dose  Ativan prn Social work - for looking into SNF Anticoag- lovanox injection for know-   LOS: 2 days   Gabrien Mentink 05/20/2012, 7:51 AM

## 2012-05-20 NOTE — Progress Notes (Signed)
CSW met with patient and patients external case manager at bedside. Patient is now stating that she will go home with 24 hour caregiver as her spouse already has a 24 hour caregiver. They had questions about durable medical equipment. CSW referred to case manager, ruth.  Tashina [employer] C. Lanetta Figuero MSW, LCSW [phone]

## 2012-05-21 LAB — CBC
HCT: 31.2 % — ABNORMAL LOW (ref 36.0–46.0)
Platelets: 128 10*3/uL — ABNORMAL LOW (ref 150–400)
RDW: 16.9 % — ABNORMAL HIGH (ref 11.5–15.5)
WBC: 6.9 10*3/uL (ref 4.0–10.5)

## 2012-05-21 LAB — TYPE AND SCREEN
Antibody Screen: NEGATIVE
Unit division: 0

## 2012-05-21 MED ORDER — LOSARTAN POTASSIUM 50 MG PO TABS
50.0000 mg | ORAL_TABLET | Freq: Every day | ORAL | Status: DC
  Administered 2012-05-21 – 2012-05-22 (×2): 50 mg via ORAL
  Filled 2012-05-21 (×2): qty 1

## 2012-05-21 MED ORDER — RIVAROXABAN 10 MG PO TABS
10.0000 mg | ORAL_TABLET | Freq: Every day | ORAL | Status: DC
  Administered 2012-05-21 – 2012-05-22 (×2): 10 mg via ORAL
  Filled 2012-05-21 (×2): qty 1

## 2012-05-21 MED ORDER — RIVAROXABAN 10 MG PO TABS: 10.0000 mg | ORAL_TABLET | Freq: Every day | ORAL | Status: DC

## 2012-05-21 MED ORDER — ACETAMINOPHEN 500 MG PO TABS: 1000.0000 mg | ORAL_TABLET | Freq: Four times a day (QID) | ORAL | Status: AC | PRN

## 2012-05-21 NOTE — Progress Notes (Signed)
Patient ID: [name], female   DOB: 07/19/1920, 78 y.o.   MRN: [id number]     Subjective:  Patient reports pain as mild to moderate.  More alert today and states that she feels better.  Objective:   VITALS:   Filed Vitals:   05/20/12 2128 05/20/12 2228 05/20/12 2338 05/21/12 0649  BP: 104/47 108/52 116/47 144/57  Pulse: 79 72 73 72  Temp: 98.7 F (37.1 C) 98.8 F (37.1 C) 98.6 F (37 C) 98 F (36.7 C)  TempSrc: Oral Oral Oral Oral  Resp: 18 18 18 18   Height:      Weight:      SpO2:    94%    ABD soft Sensation intact distally Dorsiflexion/Plantar flexion intact  LABS  Results for orders placed during the hospital encounter of 05/18/12 (from the past 24 hour(s))  CBC     Status: Abnormal   Collection Time   05/20/12  1:00 PM      Component Value Range   WBC 6.6  4.0 - 10.5 K/uL   RBC 2.58 (*) 3.87 - 5.11 MIL/uL   Hemoglobin 8.2 (*) 12.0 - 15.0 g/dL   HCT 40.9 (*) 81.1 - 91.4 %   MCV 95.3  78.0 - 100.0 fL   MCH 31.8  26.0 - 34.0 pg   MCHC 33.3  30.0 - 36.0 g/dL   RDW 78.2 (*) 95.6 - 21.3 %   Platelets 148 (*) 150 - 400 K/uL  URINALYSIS, ROUTINE W REFLEX MICROSCOPIC     Status: Abnormal   Collection Time   05/20/12  1:51 PM      Component Value Range   Color, Urine YELLOW  YELLOW   APPearance CLOUDY (*) CLEAR   Specific Gravity, Urine 1.017  1.005 - 1.030   pH 5.0  5.0 - 8.0   Glucose, UA NEGATIVE  NEGATIVE mg/dL   Hgb urine dipstick NEGATIVE  NEGATIVE   Bilirubin Urine NEGATIVE  NEGATIVE   Ketones, ur NEGATIVE  NEGATIVE mg/dL   Protein, ur NEGATIVE  NEGATIVE mg/dL   Urobilinogen, UA 0.2  0.0 - 1.0 mg/dL   Nitrite NEGATIVE  NEGATIVE   Leukocytes, UA SMALL (*) NEGATIVE  URINE MICROSCOPIC-ADD ON     Status: Abnormal   Collection Time   05/20/12  1:51 PM      Component Value Range   Squamous Epithelial / LPF FEW (*) RARE   WBC, UA 3-6  <3 WBC/hpf   Bacteria, UA RARE  RARE  PREPARE RBC (CROSSMATCH)     Status: Normal   Collection Time   05/20/12   3:00 PM      Component Value Range   Order Confirmation ORDER PROCESSED BY BLOOD BANK    CBC     Status: Abnormal   Collection Time   05/21/12  4:40 AM      Component Value Range   WBC 6.9  4.0 - 10.5 K/uL   RBC 3.37 (*) 3.87 - 5.11 MIL/uL   Hemoglobin 10.4 (*) 12.0 - 15.0 g/dL   HCT 08.6 (*) 57.8 - 46.9 %   MCV 92.6  78.0 - 100.0 fL   MCH 31.5  26.0 - 34.0 pg   MCHC 34.0  30.0 - 36.0 g/dL   RDW 62.9 (*) 52.8 - 41.3 %   Platelets 128 (*) 150 - 400 K/uL    No results found.  Assessment/Plan:     Principal Problem:  *Femur fracture, right Active Problems:  Biventricular pacemake  Chronic  systolic congestive heart failure  Hyperlipidemia  Anxiety  Hypertension  Hypotension  Anemia due to blood loss, acute   Advance diet Up with therapy Continue plan per medicine. Continue NWB right lower ext. Continue knee immobilizer.   Cleve Paolillo P 05/21/2012, 8:29 AM   Teryl Lucy, MD Cell [phone] Pager [phone]

## 2012-05-21 NOTE — Discharge Summary (Signed)
Physician Discharge Summary  Patient ID: [name] MRN: [id number] DOB/AGE: 1920/05/04 78 y.o.  Admit date: 05/18/2012 Discharge date: 05/22/2012  Admission Diagnoses:  Discharge Diagnoses:  Principal Problem:  *Femur fracture, right Active Problems:  Biventricular pacemake  Chronic systolic congestive heart failure  Hyperlipidemia  Anxiety  Hypertension  Hypotension  Anemia due to blood loss, acute   Discharged Condition: good  Hospital Course: 78 years old female admitted after a fall with a right femur fracture. Patient was at home and lost balance and fell sustained a right femur fracture. Problem #1: orthopedic: right femur fracture orthopedics was consulted after discussion with the patient and the family, patient decided not to have the surgery and have conservative management with immobilizer; she was given the option of surgery. Patient has one week to think about and he decided we'll have the surgery to fix a fever. She had previously right hip fracture repair about a year ago; did well after that.. Patient's family and the patient in agreement for the current plan of conservative management. Pain control adequate with Tylenol Physical therapy with non-weight-bearing on the right leg Follow up with orthopedic in one week DVT prevention with Xarelto 10 mg daily for 2 weeks; longer if orthopedic decides Physical therapy at home Problem #2: anemia hemoglobin drop down to 8.2 because of the bleeding from the fracture: patient received 3  units of blood; hemoglobin at time of discharge is 10.4. Monitor outpatient CBC Platelet count mildly low Problem #3: CHF, hypertension, pacemaker clinically euvolemic blood pressure stable continue on current medications at home Chest x-ray was okay Normal sinus rhythm;paced rhythm Mild anxiety: doing well lorazepam if needed Home health with physical therapy   Consults: orthopedic surgery  Significant Diagnostic Studies: labs:   Hemoglobin10.4, white cell count 6.9, UA negative blood chemistries normal and radiology: CXR: normal and X-Ray: right femur fracture; hip prosthesis in place  Treatments: IV hydration and anticoagulation: LMW heparin  Discharge Exam: Blood pressure 144/57, pulse 72, temperature 98 F (36.7 C), temperature source Oral, resp. rate 18, height 5\' 1"  (1.549 m), weight 51.891 kg (114 lb 6.4 oz), SpO2 94.00%. General appearance: alert Resp: clear to auscultation bilaterally Cardio: regular rate and rhythm GI: soft, non-tender; bowel sounds normal; no masses,  no organomegaly Extremities: extremities normal, atraumatic, no cyanosis or edema  Disposition: home with home health and physical therapy/round-the-clock care  Discharge Orders    Future Appointments: Provider: Department: Dept Phone: Center:   02/08/2013 9:00 AM [facility] [phone] [facility]   02/08/2013 10:00 AM Larina Earthly, MD [facility] [phone] [facility]       Medication List     As of 05/21/2012  8:23 AM    TAKE these medications         acetaminophen 500 MG tablet   Commonly known as: TYLENOL   Take 2 tablets (1,000 mg total) by mouth every 6 (six) hours as needed. For arthritis pain      ACIDOPHILUS PO   Take 1 tablet by mouth daily.      aspirin 81 MG tablet   Take 81 mg by mouth daily.      cholecalciferol 1000 UNITS tablet   Commonly known as: VITAMIN D   Take 1,000 Units by mouth daily.      COREG 6.25 MG tablet   Generic drug: carvedilol   Take 6.25 mg by mouth 2 (two) times daily.      COZAAR 50 MG tablet   Generic drug: losartan  Take 25 mg by mouth daily.      furosemide 20 MG tablet   Commonly known as: LASIX   Take 20 mg by mouth 2 (two) times daily.      Glucosamine 500 MG Caps   Take 3,000 mg by mouth daily. Takes six  500 mg capsules daily.      Lutein 20 MG Caps   Take 20 mg by mouth daily.      MULTIVITAL tablet   Take 1 tablet by mouth daily.      oxybutynin 10 MG  24 hr tablet   Commonly known as: DITROPAN-XL   Take 10 mg by mouth every other day.      rivaroxaban 10 MG Tabs tablet   Commonly known as: XARELTO   Take 1 tablet (10 mg total) by mouth daily.      simvastatin 20 MG tablet   Commonly known as: ZOCOR   Take 20 mg by mouth daily.      discharge planning time and discussion with the family total time 35 min.   SignedGeorgann Housekeeper 05/21/2012, 8:23 AM

## 2012-05-21 NOTE — Evaluation (Signed)
Occupational Therapy Evaluation Patient Details Name: [name] MRN: [id number] DOB: 02/01/1920 Today's Date: 05/21/2012 Time: 4098-1191 OT Time Calculation (min): 23 min  OT Assessment / Plan / Recommendation Clinical Impression  78 yo female admitted after sustaining fall at home. Has R distal femur periprosthetic fracture. Pt states she has 24 hour supervision available at home and wishes to d/c home. Skilled OT indicated to maximize independence with BADLs to min A level in prep for safe d/c home with HHOT.    OT Assessment  Patient needs continued OT Services    Follow Up Recommendations  Home health OT;Supervision/Assistance - 24 hour    Barriers to Discharge      Equipment Recommendations  Wheelchair cushion (measurements);Wheelchair (measurements) (elevating leg rest)    Recommendations for Other Services    Frequency  Min 2X/week    Precautions / Restrictions Precautions Precautions: Fall Required Braces or Orthoses: Knee Immobilizer - Right Restrictions Weight Bearing Restrictions: Yes RLE Weight Bearing: Non weight bearing   Pertinent Vitals/Pain Denied pain    ADL  Grooming: Simulated;Set up Where Assessed - Grooming: Unsupported sitting Upper Body Bathing: Simulated;Set up Where Assessed - Upper Body Bathing: Unsupported sitting Lower Body Bathing: Simulated;Moderate assistance Where Assessed - Lower Body Bathing: Supported sit to stand;Unsupported sitting Upper Body Dressing: Simulated;Set up Where Assessed - Upper Body Dressing: Unsupported sitting Lower Body Dressing: Simulated;Maximal assistance Where Assessed - Lower Body Dressing: Unsupported sitting;Supported sit to stand Toilet Transfer: Simulated;+2 Total assistance Toilet Transfer: Patient Percentage: 70% Toilet Transfer Method: Stand pivot Acupuncturist: Other (comment) (to recliner) Toileting - Clothing Manipulation and Hygiene: Simulated;Maximal assistance Where Assessed -  Engineer, mining and Hygiene: Standing Equipment Used: Rolling walker;Gait belt ADL Comments: Pt mobilizing well.     OT Diagnosis: Generalized weakness  OT Problem List: Decreased activity tolerance;Decreased knowledge of use of DME or AE OT Treatment Interventions: Self-care/ADL training;Therapeutic activities;DME and/or AE instruction;Patient/family education   OT Goals Acute Rehab OT Goals OT Goal Formulation: With patient Time For Goal Achievement: 06/04/12 Potential to Achieve Goals: Good ADL Goals Pt Will Perform Grooming: Standing at sink;Other (comment) (with minguard A.) ADL Goal: Grooming - Progress: Goal set today Pt Will Perform Lower Body Bathing: with min assist;Sit to stand from chair;Sit to stand from bed;with adaptive equipment ADL Goal: Lower Body Bathing - Progress: Goal set today Pt Will Perform Lower Body Dressing: with min assist;Sit to stand from chair;Sit to stand from bed;with adaptive equipment ADL Goal: Lower Body Dressing - Progress: Goal set today Pt Will Transfer to Toilet: with min assist;3-in-1;Stand pivot transfer;Ambulation ADL Goal: Toilet Transfer - Progress: Goal set today Pt Will Perform Toileting - Clothing Manipulation: with min assist;Standing;Sitting on 3-in-1 or toilet ADL Goal: Toileting - Clothing Manipulation - Progress: Goal set today Pt Will Perform Toileting - Hygiene: with min assist;Sit to stand from 3-in-1/toilet;Standing at 3-in-1/toilet ADL Goal: Toileting - Hygiene - Progress: Goal set today  Visit Information  Last OT Received On: 05/21/12 Assistance Needed: +1 PT/OT Co-Evaluation/Treatment: Yes    Subjective Data      Prior Functioning     Home Living Lives With: Spouse (who requires 24 hour care) Available Help at Discharge: Personal care attendant (24 hrs) Type of Home: House Home Access: Ramped entrance Home Layout: One level Bathroom Shower/Tub: Health visitor: Handicapped  height Home Adaptive Equipment: Shower chair with back;Bedside commode/3-in-1;Grab bars around toilet;Grab bars in shower;Straight cane;Walker - four wheeled;Hospital bed Prior Function Level of Independence: Independent with assistive device(s)  Driving: No Vocation: Retired Musician: HOH Dominant Hand: Right         Vision/Perception     Cognition  Overall Cognitive Status: Appears within functional limits for tasks assessed/performed Arousal/Alertness: Awake/alert Orientation Level: Appears intact for tasks assessed Behavior During Session: [facility] for tasks performed    Extremity/Trunk Assessment Right Upper Extremity Assessment RUE ROM/Strength/Tone: [facility] for tasks assessed Left Upper Extremity Assessment LUE ROM/Strength/Tone: WFL for tasks assessed Right Lower Extremity Assessment RLE ROM/Strength/Tone: Deficits RLE ROM/Strength/Tone Deficits: Moves ankle well. wearing KI.  Left Lower Extremity Assessment LLE ROM/Strength/Tone: [facility] for tasks assessed Trunk Assessment Trunk Assessment: Normal     Mobility Bed Mobility Bed Mobility: Supine to Sit Supine to Sit: HOB elevated;With rails;4: Min assist Details for Bed Mobility Assistance: Assist for R LE off bed.  Transfers Sit to Stand: 1: +2 Total assist;From bed Sit to Stand: Patient Percentage: 70% Stand to Sit: 1: +2 Total assist;To chair/3-in-1 Stand to Sit: Patient Percentage: 70% Details for Transfer Assistance: VCs safety, technique, hand placement. Assist to rise, stabilize, maneuver with RW, control descent.      Shoulder Instructions     Exercise     Balance Balance Balance Assessed: Yes Static Standing Balance Static Standing - Balance Support: Bilateral upper extremity supported Static Standing - Level of Assistance: 3: Mod assist   End of Session OT - End of Session Equipment Utilized During Treatment: Gait belt Activity Tolerance: Patient tolerated treatment well Patient left:  in chair;with call bell/phone within reach  GO     Raybon Conard A OTR/L [phone] 05/21/2012, 2:09 PM

## 2012-05-21 NOTE — Progress Notes (Signed)
Subjective: PT FEEL BETTET; PAIN CONTROL BLOOD COUNT GOOD NO SURGERY-  PT WITH NWB ON R LEG  Objective: Vital signs in last 24 hours: Temp:  [98 F (36.7 C)-99.7 F (37.6 C)] 98 F (36.7 C) (09/27 0649) Pulse Rate:  [72-88] 72  (09/27 0649) Resp:  [15-18] 18  (09/27 0649) BP: (104-144)/(42-60) 144/57 mmHg (09/27 0649) SpO2:  [92 %-97 %] 94 % (09/27 0649) Weight change:  Last BM Date: 05/20/12  Intake/Output from previous day: 09/26 0701 - 09/27 0700 In: 2152 [I.V.:1450; Blood:700; IV Piggyback:2] Out: 150 [Urine:150] Intake/Output this shift:    General appearance: alert Resp: clear to auscultation bilaterally Cardio: regular rate and rhythm GI: soft, non-tender; bowel sounds normal; no masses,  no organomegaly Extremities: edema MILD ON RIGHT LEG  Lab Results:  Basename 05/21/12 0440 05/20/12 1300  WBC 6.9 6.6  HGB 10.4* 8.2*  HCT 31.2* 24.6*  PLT 128* 148*   BMET  Basename 05/20/12 0445 05/19/12 0440  NA 136 140  K 4.1 4.2  CL 107 106  CO2 24 26  GLUCOSE 123* 153*  BUN 27* 30*  CREATININE 0.79 0.99  CALCIUM 7.8* 8.7    Studies/Results: No results found.  Medications: I have reviewed the patient's current medications.  Assessment/Plan: Right femur Fx  Ortho following- No surgical intervention - Ok for PT to work as per ortho note. Immobilizer / pain control  DVT PREVENTION WITH XARALTO  IN PLACE OF LOVANOX Anemia- BETTER AFTER 2 U TRANSFUSION  CHF/ MR-continue same meds HTN: BP ok  anxeity- low dose Ativan prn Home with hhn/ pt in am- Pt has 24 hr care at home Will need f/u with ortho in 1 week Ortho- please arrange for f/u. Case manager - arrange if any home need for tomorrow .   LOS: 3 days   Neilan Rizzo 05/21/2012, 7:51 AM

## 2012-05-21 NOTE — Evaluation (Addendum)
Physical Therapy Evaluation Patient Details Name: [name] MRN: [id number] DOB: 1920/02/01 Today's Date: 05/21/2012 Time: 4098-1191 PT Time Calculation (min): 20 min  PT Assessment / Plan / Recommendation Clinical Impression  78 yo female admitted after sustaining fall at home. Has R distal femur periprosthetic fracture. Pt states she has 24 hour supervision available at home and wishes to d/c home. Recommend HHPT, wheelchair, wheelchair cushion, R elevating legrest.     PT Assessment  Patient needs continued PT services    Follow Up Recommendations  Home health PT;Supervision/Assistance - 24 hour    Barriers to Discharge        Equipment Recommendations  Wheelchair (measurements);Wheelchair cushion (measurements) (elevating leg rest)    Recommendations for Other Services OT consult   Frequency Min 3X/week    Precautions / Restrictions Precautions Precautions: Fall Required Braces or Orthoses: Knee Immobilizer - Right Restrictions Weight Bearing Restrictions: Yes RLE Weight Bearing: Non weight bearing   Pertinent Vitals/Pain       Mobility  Bed Mobility Bed Mobility: Supine to Sit Supine to Sit: HOB elevated;With rails;4: Min assist Details for Bed Mobility Assistance: Assist for R LE off bed.  Transfers Transfers: Sit to Stand;Stand to Sit;Stand Pivot Transfers Sit to Stand: 1: +2 Total assist;From bed Sit to Stand: Patient Percentage: 70% Stand to Sit: 1: +2 Total assist;To chair/3-in-1 Stand to Sit: Patient Percentage: 70% Stand Pivot Transfers: 1: +2 Total assist Stand Pivot Transfers: Patient Percentage: 50% Details for Transfer Assistance: VCs safety, technique, hand placement. Assist to rise, stabilize, maneuver with RW, control descent, therapist had to support pt's R foot to adhere to NWB status.  Ambulation/Gait Ambulation/Gait Assistance: Not tested (comment)    Shoulder Instructions     Exercises     PT Diagnosis: Difficulty  walking;Abnormality of gait;Generalized weakness  PT Problem List: Decreased strength;Decreased activity tolerance;Decreased balance;Decreased mobility;Pain;Decreased knowledge of use of DME;Decreased knowledge of precautions PT Treatment Interventions: DME instruction;Gait training;Functional mobility training;Therapeutic activities;Therapeutic exercise;Balance training;Patient/family education   PT Goals Acute Rehab PT Goals PT Goal Formulation: With patient Time For Goal Achievement: 05/28/12 Potential to Achieve Goals: Good Pt will go Supine/Side to Sit: with supervision PT Goal: Supine/Side to Sit - Progress: Goal set today Pt will go Sit to Supine/Side: with supervision PT Goal: Sit to Supine/Side - Progress: Goal set today Pt will go Sit to Stand: with min assist PT Goal: Sit to Stand - Progress: Goal set today Pt will Transfer Bed to Chair/Chair to Bed: with min assist PT Transfer Goal: Bed to Chair/Chair to Bed - Progress: Goal set today Pt will Propel Wheelchair: 51 - 150 feet;with supervision PT Goal: Propel Wheelchair - Progress: Goal set today  Visit Information  Last PT Received On: 05/21/12 Assistance Needed: +1 PT/OT Co-Evaluation/Treatment: Yes    Subjective Data  Subjective: "Oh sure I can" Patient Stated Goal: Home   Prior Functioning  Home Living Lives With: Spouse (who requires 24 hour care) Available Help at Discharge: Personal care attendant (24 hrs) Type of Home: House Home Access: Ramped entrance Home Layout: One level Bathroom Shower/Tub: Health visitor: Handicapped height Home Adaptive Equipment: Shower chair with back;Bedside commode/3-in-1;Grab bars around toilet;Grab bars in shower;Straight cane;Walker - four wheeled;Hospital bed Prior Function Level of Independence: Independent with assistive device(s) Driving: No Vocation: Retired Musician: No difficulties Dominant Hand: Right    Cognition  Overall  Cognitive Status: Appears within functional limits for tasks assessed/performed Arousal/Alertness: Awake/alert Orientation Level: Appears intact for tasks assessed Behavior During Session:  WFL for tasks performed    Extremity/Trunk Assessment Right Lower Extremity Assessment RLE ROM/Strength/Tone: Deficits RLE ROM/Strength/Tone Deficits: Moves ankle well. wearing KI.  Left Lower Extremity Assessment LLE ROM/Strength/Tone: WFL for tasks assessed Trunk Assessment Trunk Assessment: Normal   Balance Balance Balance Assessed: Yes Static Standing Balance Static Standing - Balance Support: Bilateral upper extremity supported Static Standing - Level of Assistance: 3: Mod assist  End of Session PT - End of Session Equipment Utilized During Treatment: Gait belt Activity Tolerance: Patient tolerated treatment well Patient left: in chair;with call bell/phone within reach  GP     Rebeca Alert [facility] 05/21/2012, 12:29 PM [phone]

## 2012-05-21 NOTE — Progress Notes (Signed)
Notified Joni Reining, Dr. Venita Sheffield nurse regarding need for an order for wheelchair with foot rest ordered by doctor for home use.

## 2012-05-22 LAB — URINE CULTURE: Colony Count: 40000

## 2012-05-22 MED ORDER — RIVAROXABAN 10 MG PO TABS: 10.0000 mg | ORAL_TABLET | Freq: Every day | ORAL | Status: AC

## 2012-05-22 NOTE — Progress Notes (Signed)
Physical Therapy Treatment Patient Details Name: [name] MRN: [id number] DOB: 04/22/1920 Today's Date: 05/22/2012 Time: 6213-0865 PT Time Calculation (min): 39 min  PT Assessment / Plan / Recommendation Comments on Treatment Session  L distal femur fx distal to old IM nail.  Pt choosing non surgical approach.  KI all times and NWB.  Personal siter she has had for years was in the room.  Educated on [place] use and proper placement.    Follow Up Recommendations  Home health PT    Barriers to Discharge        Equipment Recommendations       Recommendations for Other Services    Frequency Min 3X/week   Plan Discharge plan remains appropriate    Precautions / Restrictions Precautions Precautions: Fall Required Braces or Orthoses: Knee Immobilizer - Right Knee Immobilizer - Right: On at all times Restrictions Weight Bearing Restrictions: Yes RLE Weight Bearing: Non weight bearing Other Position/Activity Restrictions: pt aware    Pertinent Vitals/Pain No c/o pain    Mobility  Bed Mobility Bed Mobility: Supine to Sit Supine to Sit: 4: Min guard Details for Bed Mobility Assistance: Min Guard assist to support R LE off bed with increased time Transfers Transfers: Editor, commissioning Transfers: 4: Min assist Details for Transfer Assistance: 1/4 turn towards her L w/o AD and 50% VC's on proper hand placement.         PT Goals   progressing    Visit Information  Last PT Received On: 05/22/12 Assistance Needed: +1             End of Session PT - End of Session Equipment Utilized During Treatment: Gait belt Activity Tolerance: Patient tolerated treatment well Patient left: in chair;with call bell/phone within reach;with family/visitor present   Felecia Shelling  PTA [facility]  Acute  Rehab Pager     [phone]

## 2012-05-22 NOTE — Progress Notes (Signed)
Called [facility] who said they would deliver her WC first stop on Sunday morning.  [facility] to follow-up with patient at home.

## 2012-05-22 NOTE — Progress Notes (Signed)
Subjective: She had a good night, very little pain. Ready to get home  Objective: Vital signs in last 24 hours: Temp:  [98.4 F (36.9 C)-99.5 F (37.5 C)] 98.4 F (36.9 C) (09/28 0615) Pulse Rate:  [65-70] 70  (09/27 2156) Resp:  [16] 16  (09/27 2156) BP: (113-157)/(54-65) 152/62 mmHg (09/28 0617) SpO2:  [95 %-97 %] 97 % (09/28 0615) Weight:  [52 kg (114 lb 10.2 oz)] 52 kg (114 lb 10.2 oz) (09/28 0721) Weight change:  Last BM Date: 05/20/12  Intake/Output from previous day: 09/27 0701 - 09/28 0700 In: 480 [P.O.:480] Out: -  Intake/Output this shift:    Resp: clear to auscultation bilaterally Cardio: regular rate and rhythm, S1, S2 normal, no murmur, click, rub or gallop  Lab Results:  Basename 05/21/12 0440 05/20/12 1300  WBC 6.9 6.6  HGB 10.4* 8.2*  HCT 31.2* 24.6*  PLT 128* 148*   BMET  Basename 05/20/12 0445  NA 136  K 4.1  CL 107  CO2 24  GLUCOSE 123*  BUN 27*  CREATININE 0.79  CALCIUM 7.8*    Studies/Results: No results found.  Medications:  Scheduled:   . carvedilol  6.25 mg Oral BID  . docusate sodium  100 mg Oral BID  . feeding supplement  237 mL Oral Q1400  . furosemide  20 mg Oral Daily  . losartan  50 mg Oral Daily  . oxybutynin  10 mg Oral QODAY  . rivaroxaban  10 mg Oral Daily  . simvastatin  20 mg Oral Daily  . sodium chloride  3 mL Intravenous Q12H    Assessment/Plan: 1. S/p right femur frx, she is very anxous to return home 2. Doing well will send home today  LOS: 4 days   [facility] 05/22/2012, 10:51 AM

## 2012-05-22 NOTE — Care Management Note (Signed)
    Page 1 of 2   05/22/2012     3:43:18 PM   CARE MANAGEMENT NOTE 05/22/2012  Patient:  [name], [name]   Account Number:  [id number]  Date Initiated:  05/22/2012  Documentation initiated by:  Konrad Felix  Subjective/Objective Assessment:   Patient admitted with fractured femur.     Action/Plan:   Patient will discharge to home with home health services.   Anticipated DC Date:  05/22/2012   Anticipated DC Plan:  HOME W HOME HEALTH SERVICES      DC Planning Services  CM consult      PAC Choice  DURABLE MEDICAL EQUIPMENT  HOME HEALTH   Choice offered to / List presented to:     DME arranged  WHEELCHAIR - MANUAL      DME agency  [facility]     HH arranged  HH-2 PT      [facility] agency  [facility]   Status of service:  Completed, signed off Medicare Important Message given?   (If response is "NO", the following Medicare IM given date fields will be blank) Date Medicare IM given:   Date Additional Medicare IM given:    Discharge Disposition:  HOME W HOME HEALTH SERVICES  Per UR Regulation:  Reviewed for med. necessity/level of care/duration of stay  If discussed at Long Length of Stay Meetings, dates discussed:    Comments:  05/22/2012  Konrad Felix RN, case manager   [phone] Rec'd call from attending nurse with concern over not seeing indication of patient's [facility] and DME arranged. However, orders were not written. With new orders, HH PT and manual wheelchair with right leg rest were ordered to be delivered to patient's home.

## 2012-05-22 NOTE — Progress Notes (Signed)
Pt to be d/c today to Home.  Pt and family agreeable.   Plan transfer via EMS.   CSW signing off.  Leron Croak, LCSWA [employer] [phone]

## 2012-05-23 DIAGNOSIS — I5022 Chronic systolic (congestive) heart failure: Secondary | ICD-10-CM | POA: Diagnosis not present

## 2012-05-23 DIAGNOSIS — S7290XD Unspecified fracture of unspecified femur, subsequent encounter for closed fracture with routine healing: Secondary | ICD-10-CM | POA: Diagnosis not present

## 2012-05-23 DIAGNOSIS — IMO0001 Reserved for inherently not codable concepts without codable children: Secondary | ICD-10-CM | POA: Diagnosis not present

## 2012-05-23 DIAGNOSIS — D62 Acute posthemorrhagic anemia: Secondary | ICD-10-CM | POA: Diagnosis not present

## 2012-05-23 DIAGNOSIS — F329 Major depressive disorder, single episode, unspecified: Secondary | ICD-10-CM | POA: Diagnosis not present

## 2012-05-26 DIAGNOSIS — D62 Acute posthemorrhagic anemia: Secondary | ICD-10-CM | POA: Diagnosis not present

## 2012-05-26 DIAGNOSIS — IMO0001 Reserved for inherently not codable concepts without codable children: Secondary | ICD-10-CM | POA: Diagnosis not present

## 2012-05-26 DIAGNOSIS — I5022 Chronic systolic (congestive) heart failure: Secondary | ICD-10-CM | POA: Diagnosis not present

## 2012-05-26 DIAGNOSIS — S7290XD Unspecified fracture of unspecified femur, subsequent encounter for closed fracture with routine healing: Secondary | ICD-10-CM | POA: Diagnosis not present

## 2012-05-26 DIAGNOSIS — F329 Major depressive disorder, single episode, unspecified: Secondary | ICD-10-CM | POA: Diagnosis not present

## 2012-05-28 DIAGNOSIS — S7290XD Unspecified fracture of unspecified femur, subsequent encounter for closed fracture with routine healing: Secondary | ICD-10-CM | POA: Diagnosis not present

## 2012-05-28 DIAGNOSIS — F329 Major depressive disorder, single episode, unspecified: Secondary | ICD-10-CM | POA: Diagnosis not present

## 2012-05-28 DIAGNOSIS — I5022 Chronic systolic (congestive) heart failure: Secondary | ICD-10-CM | POA: Diagnosis not present

## 2012-05-28 DIAGNOSIS — IMO0001 Reserved for inherently not codable concepts without codable children: Secondary | ICD-10-CM | POA: Diagnosis not present

## 2012-05-28 DIAGNOSIS — D62 Acute posthemorrhagic anemia: Secondary | ICD-10-CM | POA: Diagnosis not present

## 2012-05-31 DIAGNOSIS — D62 Acute posthemorrhagic anemia: Secondary | ICD-10-CM | POA: Diagnosis not present

## 2012-05-31 DIAGNOSIS — IMO0001 Reserved for inherently not codable concepts without codable children: Secondary | ICD-10-CM | POA: Diagnosis not present

## 2012-05-31 DIAGNOSIS — I5022 Chronic systolic (congestive) heart failure: Secondary | ICD-10-CM | POA: Diagnosis not present

## 2012-05-31 DIAGNOSIS — S7290XD Unspecified fracture of unspecified femur, subsequent encounter for closed fracture with routine healing: Secondary | ICD-10-CM | POA: Diagnosis not present

## 2012-05-31 DIAGNOSIS — F329 Major depressive disorder, single episode, unspecified: Secondary | ICD-10-CM | POA: Diagnosis not present

## 2012-06-02 DIAGNOSIS — S7290XD Unspecified fracture of unspecified femur, subsequent encounter for closed fracture with routine healing: Secondary | ICD-10-CM | POA: Diagnosis not present

## 2012-06-02 DIAGNOSIS — F329 Major depressive disorder, single episode, unspecified: Secondary | ICD-10-CM | POA: Diagnosis not present

## 2012-06-02 DIAGNOSIS — I5022 Chronic systolic (congestive) heart failure: Secondary | ICD-10-CM | POA: Diagnosis not present

## 2012-06-02 DIAGNOSIS — IMO0001 Reserved for inherently not codable concepts without codable children: Secondary | ICD-10-CM | POA: Diagnosis not present

## 2012-06-02 DIAGNOSIS — D62 Acute posthemorrhagic anemia: Secondary | ICD-10-CM | POA: Diagnosis not present

## 2012-06-03 DIAGNOSIS — S72009D Fracture of unspecified part of neck of unspecified femur, subsequent encounter for closed fracture with routine healing: Secondary | ICD-10-CM | POA: Diagnosis not present

## 2012-06-04 ENCOUNTER — Other Ambulatory Visit: Payer: Self-pay | Admitting: Cardiology

## 2012-06-04 DIAGNOSIS — Z950 Presence of cardiac pacemaker: Secondary | ICD-10-CM | POA: Diagnosis not present

## 2012-06-04 DIAGNOSIS — E785 Hyperlipidemia, unspecified: Secondary | ICD-10-CM | POA: Diagnosis not present

## 2012-06-04 DIAGNOSIS — I5022 Chronic systolic (congestive) heart failure: Secondary | ICD-10-CM | POA: Diagnosis not present

## 2012-06-04 DIAGNOSIS — I714 Abdominal aortic aneurysm, without rupture: Secondary | ICD-10-CM | POA: Diagnosis not present

## 2012-06-04 DIAGNOSIS — I059 Rheumatic mitral valve disease, unspecified: Secondary | ICD-10-CM | POA: Diagnosis not present

## 2012-06-04 DIAGNOSIS — I447 Left bundle-branch block, unspecified: Secondary | ICD-10-CM | POA: Diagnosis not present

## 2012-06-04 DIAGNOSIS — I359 Nonrheumatic aortic valve disorder, unspecified: Secondary | ICD-10-CM | POA: Diagnosis not present

## 2012-06-04 DIAGNOSIS — Z01810 Encounter for preprocedural cardiovascular examination: Secondary | ICD-10-CM | POA: Diagnosis not present

## 2012-06-04 NOTE — Progress Notes (Signed)
[name]  Date of visit:  06/04/2012 DOB:  Jun 23, 1920    Age:  78 yrs. Medical record number:  [id number]     Account number:  [id number] Primary Care Provider: Georgann Housekeeper ____________________________ CURRENT DIAGNOSES  1. Systolic Heart Failure Chronic  2. Aneurysm-Abdominal  3. Pacemaker/cardiac insitu  4. Hyperlipidemia  5. Mitral Valve Disorder  6. Aortic Valve-Regurgitation  7. Left Bundle Branch Block  8. Preop cardiac exam ____________________________ ALLERGIES  ACE Inhibitors, Cough-non-productive  All mind altering drugs  Lipitor ____________________________ MEDICATIONS  1. aspirin 81 mg tablet, effervescent, 1 p.o. daily  2. Glucosamine 500 mg tablet, 3000 mg qd  3. multivitamin tablet, 1 p.o. daily  4. simvastatin 20 mg tablet, 1 p.o. daily  5. Tylenol Extra Strength 500 mg tablet, PRN  6. lutein 20 mg Capsule, 1 p.o. daily  7. Vitamin D3 2,000 unit tablet, 1 p.o. daily  8. carvedilol 6.25 mg tablet, BID  9. meclizine 25 mg tablet, PRN  10. lactobacillus acidoph-pectin 30-20 mg tablet, 1 p.o. daily  11. Calcium 500 500 mg calcium (1,250 mg) tablet, BID  12. furosemide 20 mg tablet, BID  13. losartan 50 mg tablet, 1/2 tab b.i.d.  14. oxybutynin chloride 10 mg tablet extended release 24hr, QOD  15. omeprazole 20 mg tablet,delayed release (DR/EC), PRN  16. Xarelto 10 mg tablet, 1 p.o. daily  17. Acidophilus capsule, 1 p.o. daily ____________________________ CHIEF COMPLAINTS  Fx rt femur  ok for surgery ____________________________ HISTORY OF PRESENT ILLNESS  Patient seen for preoperative cardiac evaluation. She has a known cardiomyopathy and had upgrade to a biventricular defibrillator almost 2 years ago. She had abdominal surgery earlier this year and had recently fractured her right femur and was hospitalized. She is in need of surgery for this now. She had significant anemia that came from bleeding intramedullary and she was transfused when she was  in the hospital. She also has a large abdominal aneurysm of 5.7 cm but is not felt to be a good candidate for surgery or repair. She was discharged on prophylaxis with Xarelto 10 mg. She is sedentary in a chair. She is not having any decompensated congestive heart failure. She denies angina and has no PND, orthopnea or edema.  ____________________________ PAST HISTORY  Past Medical Illnesses:  hyperlipidemia, osteoporosis, anxiety, depression;  Cardiovascular Illnesses:  pacer, syncope, aortic regurgitation, mitral regurgitation, abdominal aneurysm, cardiomyopathy(dilated) November 2010;  Surgical Procedures:  breast biopsy, cataract extraction OU, knee surgery, repair of incarcerated bowel and hernia, repair of right ferur rfracture;  NYHA Classification:  II;  Cardiology Procedures-Invasive:  Medtronic pacemaker implant September 2006, Medtronic pacemaker implant January 2012;  Cardiology Procedures-Noninvasive:  adenosine cardiolite January 2008, echocardiogram November 2010, echocardiogram December 2011;  LVEF of 35% documented via echocardiogram on 01/15/2011 ____________________________ CARDIO-PULMONARY TEST DATES EKG Date:  06/04/2012;  Holter/Event Monitor Date: 04/25/2005;  Nuclear Study Date:  09/17/2006;  Echocardiography Date: 11/08/2011;   ____________________________ FAMILY HISTORY Father - age 1,  died of heart disease; Mother - age 26,  deceased and  history ofCHF; Brother 1 - age 22,  alive and well;  ____________________________ SOCIAL HISTORY Alcohol Use:  no alcohol use;  Smoking:  never smoked;  Diet:  regular diet;  Lifestyle:  divorced, remarried and 2 children;  Exercise:  swimming;  Occupation:  retired;  Residence:  lives with husband, husband disabled;   ____________________________ REVIEW OF SYSTEMS General:  malaise and fatigue  Ears, Nose, Throat, Mouth:  partial hearing loss both ears, hearing aides  both ears  Respiratory:  denies dyspnea, cough, wheezing or hemoptysis.   Cardiovascular:  please review HPI  Abdominal:  denies dyspepsia, GI bleeding, constipation, or diarrhea Genitourinary-Female:  cystocoele, frequency  Musculoskeletal: See history of present illness Psychiatric:  anxiety ____________________________ PHYSICAL EXAMINATION VITAL SIGNS  Blood Pressure:  120/70 Sitting, Left arm, regular cuff   Pulse:  76/min. Weight:  .00 lbs. Height:  60"BMI: 0  Constitutional:  pleasant white female, in no acute distress, talkative Skin:  warm and dry to touch, no apparent skin lesions, or masses noted. Head:  normocephalic, normal hair pattern, no masses or tenderness ENT:  ears, nose and throat reveal no gross abnormalities.  Dentition good. Neck:  supple, no masses, thyromegaly, JVD. Carotid pulses are full and equal bilaterally without bruits. Chest:  normal symmetry, clear to auscultation and percussion., healed pacemaker incision in the left pectoral area Cardiac:  regular rhythm, normal S1 and S2, No S3 or S4, 1/6/ murmur Peripheral Pulses:  the femoral,dorsalis pedis, and posterior tibial pulses are full and equal bilaterally with no bruits auscultated. Extremities & Back:  right leg in soft splint Neurological:  no gross motor or sensory deficits noted, affect appropriate, oriented x3. ____________________________ IMPRESSIONS/PLAN  1. From a cardiovascular viewpoint, it is acceptable to proceed with the planned orthopedic surgery. Her risk of anesthesia will be increased due to her age and other medical problems but she is compensated from a cardiovascular viewpoint. She should discontinue Xarelto 2 days prior to surgery. She should continue her other medications during the perioperative period. The surgery should be done in a hospital setting and she should be monitored on telemetry following the surgery. 2. Cardiomyopathy-stable 3. Aortic valve disease 4. Significant anxiety and depression  12 EKG shows sinus rhythm with a paced ventricular  rhythm. I would be happy to see her in the hospital the time of surgery if needed. ____________________________ TODAYS ORDERS  1. 12 Lead EKG: Today  2. Return Visit: keep appt                       ____________________________ Cardiology Physician:  Darden Palmer MD [facility]

## 2012-06-07 ENCOUNTER — Other Ambulatory Visit: Payer: Self-pay | Admitting: Orthopedic Surgery

## 2012-06-07 ENCOUNTER — Encounter (HOSPITAL_COMMUNITY): Payer: Self-pay | Admitting: Pharmacy Technician

## 2012-06-07 ENCOUNTER — Encounter (HOSPITAL_COMMUNITY): Payer: Self-pay

## 2012-06-07 ENCOUNTER — Encounter (HOSPITAL_COMMUNITY)
Admission: RE | Admit: 2012-06-07 | Discharge: 2012-06-07 | Disposition: A | Discharge status: Home / Self Care | Payer: Medicare Other | Source: Ambulatory Visit | Attending: Orthopedic Surgery | Admitting: Orthopedic Surgery

## 2012-06-07 DIAGNOSIS — I5022 Chronic systolic (congestive) heart failure: Secondary | ICD-10-CM | POA: Diagnosis not present

## 2012-06-07 DIAGNOSIS — S7290XD Unspecified fracture of unspecified femur, subsequent encounter for closed fracture with routine healing: Secondary | ICD-10-CM | POA: Diagnosis not present

## 2012-06-07 DIAGNOSIS — F329 Major depressive disorder, single episode, unspecified: Secondary | ICD-10-CM | POA: Diagnosis not present

## 2012-06-07 DIAGNOSIS — IMO0001 Reserved for inherently not codable concepts without codable children: Secondary | ICD-10-CM | POA: Diagnosis not present

## 2012-06-07 DIAGNOSIS — D62 Acute posthemorrhagic anemia: Secondary | ICD-10-CM | POA: Diagnosis not present

## 2012-06-07 LAB — BASIC METABOLIC PANEL
CO2: 28 mEq/L (ref 19–32)
Chloride: 103 mEq/L (ref 96–112)
GFR calc Af Amer: 87 mL/min — ABNORMAL LOW (ref >90)
Potassium: 4 mEq/L (ref 3.5–5.1)
Sodium: 138 mEq/L (ref 135–145)

## 2012-06-07 LAB — SURGICAL PCR SCREEN: Staphylococcus aureus: NEGATIVE

## 2012-06-07 LAB — CBC
HCT: 34.8 % — ABNORMAL LOW (ref 36.0–46.0)
MCV: 96.9 fL (ref 78.0–100.0)
RBC: 3.59 MIL/uL — ABNORMAL LOW (ref 3.87–5.11)
WBC: 8.7 10*3/uL (ref 4.0–10.5)

## 2012-06-07 MED ORDER — CEFAZOLIN SODIUM-DEXTROSE 2-3 GM-% IV SOLR
2.0000 g | INTRAVENOUS | Status: AC
  Administered 2012-06-08: 2 g via INTRAVENOUS
  Filled 2012-06-07: qty 50

## 2012-06-07 NOTE — Progress Notes (Signed)
Pt requested th speak with anesthesa before surgery at PAT visit. Will give infor. To [facility] for review.

## 2012-06-07 NOTE — Consult Note (Addendum)
Anesthesia consult: Patient is a 78 year old female scheduled for right femur internal fixation by Dr. Dion Saucier on 06/08/2012. History includes chronic systolic heart failure, Medtronic pacemaker (upgrade to BiV pacemaker on 10/18/10), AAA (5.7 cm by CT 11/06/11 followed by Dr. Carlena Bjornstad felt to be a good candidate for surgical repair according to Dr. York Spaniel note), hyperlipidemia, chronic left bundle branch block, moderate to severe mitral regurgitation and mild aortic regurgitation by March 2013 echo, anxiety, nonsmoker, depression, small bowel resection and left inguinal hernia repair on 11/11/11.  Patient presents with her caregiver.  Patient is wanting this procedure done under spinal anesthesia.  She has been told so many times that she is not a good surgical candidate that she is fearful of undergoing general anesthesia if alternate means of anesthesia are available.  She cares for her husband and is very emotional, understandable, about wanting to be around for him. She has discussed this with Dr. Dion Saucier. Patient understands that spinal anesthesia may be an option, but once she is evaluated by her assigned anesthesiologist, he/she may feel that general anesthesia may be her best option.  She knows that her Anesthesiologist will talk with her on the day of surgery.  At this point, she seems to want to forgo the procedure unless it can be done under a spinal.  I have updated Anesthesiologist Dr. Krista Blue.  Her Cardiologist is Dr. Viann Fish.  He saw her on 06/04/2012 for a preoperative evaluation. He felt she was "acceptable to proceed with the planned orthopedic surgery."   EKG done then noted.  By notes, her last stress test was in 2008.  Echo on 11/08/11 showed: - Left ventricle: The cavity size was normal. There was severe concentric hypertrophy. Systolic function was moderately reduced. The estimated ejection fraction was in the range of 35% to 40%. Moderate diffuse hypokinesis. - Aortic valve:  Mild regurgitation. - Mitral valve: Moderately calcified annulus. Mildly thickened leaflets . Moderate to severe regurgitation. - Left atrium: The atrium was severely dilated. - Pulmonary arteries: Systolic pressure was moderately to severely increased. PA peak pressure: 67mm Hg (S).  Chest x-ray on 05/18/2012 showed no acute cardiopulmonary abnormality.  Labs noted.  Since she wants this procedure done with spinal anesthesia, will get a PT/PTT on arrival.  (Lab unable to add on to blood drawn today.)  Definitive anesthesia plan to be determined after evaluation with her Anesthesiologist.  Shonna Chock, PA-C 06/07/12 1645

## 2012-06-07 NOTE — Anesthesia Preprocedure Evaluation (Addendum)
Anesthesia Evaluation  Patient identified by MRN, date of birth, ID band Patient awake    Reviewed: Allergy & Precautions, H&P , NPO status   Airway Mallampati: II TM Distance: >3 FB     Dental  (+) Teeth Intact   Pulmonary  breath sounds clear to auscultation        Cardiovascular Rhythm:Regular Rate:Normal     Neuro/Psych    GI/Hepatic   Endo/Other    Renal/GU      Musculoskeletal   Abdominal   Peds  Hematology   Anesthesia Other Findings   Reproductive/Obstetrics                           Anesthesia Physical Anesthesia Plan  ASA: III  Anesthesia Plan: Spinal   Post-op Pain Management:    Induction: Intravenous  Airway Management Planned: Natural Airway  Additional Equipment:   Intra-op Plan:   Post-operative Plan:   Informed Consent: I have reviewed the patients History and Physical, chart, labs and discussed the procedure including the risks, benefits and alternatives for the proposed anesthesia with the patient or authorized representative who has indicated his/her understanding and acceptance.   Dental advisory given  Plan Discussed with: CRNA and Surgeon  Anesthesia Plan Comments: (Please see my anesthesia consult note.  Patient wants spinal anesthesia.  Shonna Chock, PA-C)       Anesthesia Quick Evaluation

## 2012-06-07 NOTE — Pre-Procedure Instructions (Signed)
22 [name]  06/07/2012   Your procedure is scheduled on:  06/08/12  Report to [facility] at 1250 pm  Call this number if you have problems the morning of surgery: [phone]   Remember:   Do not eat food:After Midnight.    Take these medicines the morning of surgery with A SIP OF WATER: carvedilol,ditropan   Do not wear jewelry, make-up or nail polish.  Do not wear lotions, powders, or perfumes. You may wear deodorant.  Do not shave 48 hours prior to surgery. Men may shave face and neck.  Do not bring valuables to the hospital.  Contacts, dentures or bridgework may not be worn into surgery.  Leave suitcase in the car. After surgery it may be brought to your room.  For patients admitted to the hospital, checkout time is 11:00 AM the day of discharge.   Patients discharged the day of surgery will not be allowed to drive home.  Name and phone number of your driver: family  Special Instructions: Shower using CHG 2 nights before surgery and the night before surgery.  If you shower the day of surgery use CHG.  Use special wash - you have one bottle of CHG for all showers.  You should use approximately 1/3 of the bottle for each shower.   Please read over the following fact sheets that you were given: Pain Booklet, Coughing and Deep Breathing, MRSA Information and Surgical Site Infection Prevention

## 2012-06-08 ENCOUNTER — Encounter (HOSPITAL_COMMUNITY): Payer: Self-pay | Admitting: Vascular Surgery

## 2012-06-08 ENCOUNTER — Encounter (HOSPITAL_COMMUNITY)
Admission: RE | Disposition: A | Discharge status: Home Health | Payer: Self-pay | Source: Ambulatory Visit | Attending: Orthopedic Surgery

## 2012-06-08 ENCOUNTER — Inpatient Hospital Stay (HOSPITAL_COMMUNITY): Payer: Medicare Other

## 2012-06-08 ENCOUNTER — Inpatient Hospital Stay (HOSPITAL_COMMUNITY)
Admission: RE | Admit: 2012-06-08 | Discharge: 2012-06-11 | DRG: 481 | Disposition: A | Discharge status: Home Health | Payer: Medicare Other | Source: Ambulatory Visit | Attending: Orthopedic Surgery | Admitting: Orthopedic Surgery

## 2012-06-08 ENCOUNTER — Ambulatory Visit (HOSPITAL_COMMUNITY): Payer: Medicare Other | Admitting: Vascular Surgery

## 2012-06-08 ENCOUNTER — Encounter (HOSPITAL_COMMUNITY): Payer: Self-pay | Admitting: *Deleted

## 2012-06-08 ENCOUNTER — Ambulatory Visit (HOSPITAL_COMMUNITY): Payer: Medicare Other

## 2012-06-08 DIAGNOSIS — S72453A Displaced supracondylar fracture without intracondylar extension of lower end of unspecified femur, initial encounter for closed fracture: Secondary | ICD-10-CM | POA: Diagnosis not present

## 2012-06-08 DIAGNOSIS — Z9660 Presence of unspecified orthopedic joint implant: Secondary | ICD-10-CM | POA: Diagnosis not present

## 2012-06-08 DIAGNOSIS — M978XXA Periprosthetic fracture around other internal prosthetic joint, initial encounter: Secondary | ICD-10-CM

## 2012-06-08 DIAGNOSIS — T84049A Periprosthetic fracture around unspecified internal prosthetic joint, initial encounter: Principal | ICD-10-CM | POA: Diagnosis present

## 2012-06-08 DIAGNOSIS — I714 Abdominal aortic aneurysm, without rupture, unspecified: Secondary | ICD-10-CM | POA: Diagnosis present

## 2012-06-08 DIAGNOSIS — S7290XA Unspecified fracture of unspecified femur, initial encounter for closed fracture: Secondary | ICD-10-CM | POA: Diagnosis not present

## 2012-06-08 DIAGNOSIS — Z950 Presence of cardiac pacemaker: Secondary | ICD-10-CM

## 2012-06-08 DIAGNOSIS — Z01812 Encounter for preprocedural laboratory examination: Secondary | ICD-10-CM

## 2012-06-08 DIAGNOSIS — Z7982 Long term (current) use of aspirin: Secondary | ICD-10-CM

## 2012-06-08 DIAGNOSIS — F3289 Other specified depressive episodes: Secondary | ICD-10-CM | POA: Diagnosis present

## 2012-06-08 DIAGNOSIS — D62 Acute posthemorrhagic anemia: Secondary | ICD-10-CM | POA: Diagnosis not present

## 2012-06-08 DIAGNOSIS — X500XXA Overexertion from strenuous movement or load, initial encounter: Secondary | ICD-10-CM | POA: Diagnosis present

## 2012-06-08 DIAGNOSIS — F329 Major depressive disorder, single episode, unspecified: Secondary | ICD-10-CM | POA: Diagnosis present

## 2012-06-08 DIAGNOSIS — Y92009 Unspecified place in unspecified non-institutional (private) residence as the place of occurrence of the external cause: Secondary | ICD-10-CM

## 2012-06-08 DIAGNOSIS — Z79899 Other long term (current) drug therapy: Secondary | ICD-10-CM

## 2012-06-08 DIAGNOSIS — Z96659 Presence of unspecified artificial knee joint: Secondary | ICD-10-CM

## 2012-06-08 DIAGNOSIS — E785 Hyperlipidemia, unspecified: Secondary | ICD-10-CM | POA: Diagnosis not present

## 2012-06-08 DIAGNOSIS — I509 Heart failure, unspecified: Secondary | ICD-10-CM | POA: Diagnosis not present

## 2012-06-08 DIAGNOSIS — F411 Generalized anxiety disorder: Secondary | ICD-10-CM | POA: Diagnosis present

## 2012-06-08 DIAGNOSIS — Z7901 Long term (current) use of anticoagulants: Secondary | ICD-10-CM

## 2012-06-08 DIAGNOSIS — Y831 Surgical operation with implant of artificial internal device as the cause of abnormal reaction of the patient, or of later complication, without mention of misadventure at the time of the procedure: Secondary | ICD-10-CM | POA: Diagnosis present

## 2012-06-08 DIAGNOSIS — Z981 Arthrodesis status: Secondary | ICD-10-CM | POA: Diagnosis not present

## 2012-06-08 HISTORY — DX: Periprosthetic fracture around other internal prosthetic joint, initial encounter: M97.8XXA

## 2012-06-08 HISTORY — DX: Presence of unspecified artificial knee joint: Z96.659

## 2012-06-08 HISTORY — PX: ORIF TIBIA PLATEAU: SHX2132

## 2012-06-08 LAB — URINE MICROSCOPIC-ADD ON

## 2012-06-08 LAB — URINALYSIS, ROUTINE W REFLEX MICROSCOPIC
Glucose, UA: NEGATIVE mg/dL
Nitrite: POSITIVE — AB
Specific Gravity, Urine: 1.023 (ref 1.005–1.030)
pH: 6.5 (ref 5.0–8.0)

## 2012-06-08 LAB — PROTIME-INR: INR: 1.07 (ref 0.00–1.49)

## 2012-06-08 SURGERY — OPEN REDUCTION INTERNAL FIXATION (ORIF) TIBIAL PLATEAU
Anesthesia: General | Site: Leg Upper | Laterality: Right | Wound class: Clean

## 2012-06-08 MED ORDER — CEFAZOLIN SODIUM-DEXTROSE 2-3 GM-% IV SOLR
2.0000 g | Freq: Four times a day (QID) | INTRAVENOUS | Status: AC
  Administered 2012-06-09: 2 g via INTRAVENOUS
  Filled 2012-06-08 (×2): qty 50

## 2012-06-08 MED ORDER — HYDROMORPHONE HCL PF 1 MG/ML IJ SOLN
0.2500 mg | INTRAMUSCULAR | Status: DC | PRN
  Administered 2012-06-08: 0.25 mg via INTRAVENOUS

## 2012-06-08 MED ORDER — DEXMEDETOMIDINE HCL IN NACL 200 MCG/50ML IV SOLN
0.4000 ug/kg/h | INTRAVENOUS | Status: DC
  Filled 2012-06-08: qty 50

## 2012-06-08 MED ORDER — FENTANYL CITRATE 0.05 MG/ML IJ SOLN
INTRAMUSCULAR | Status: DC | PRN
  Administered 2012-06-08 (×2): 25 ug via INTRAVENOUS
  Administered 2012-06-08: 50 ug via INTRAVENOUS
  Administered 2012-06-08 (×3): 25 ug via INTRAVENOUS
  Administered 2012-06-08: 100 ug via INTRAVENOUS
  Administered 2012-06-08: 50 ug via INTRAVENOUS
  Administered 2012-06-08: 25 ug via INTRAVENOUS

## 2012-06-08 MED ORDER — SUCCINYLCHOLINE CHLORIDE 20 MG/ML IJ SOLN
INTRAMUSCULAR | Status: DC | PRN
  Administered 2012-06-08: 80 mg via INTRAVENOUS

## 2012-06-08 MED ORDER — PHENYLEPHRINE HCL 10 MG/ML IJ SOLN
10.0000 mg | INTRAVENOUS | Status: DC | PRN
  Administered 2012-06-08: 1 ug/min via INTRAVENOUS

## 2012-06-08 MED ORDER — SODIUM CHLORIDE 0.9 % IR SOLN
Status: DC | PRN
  Administered 2012-06-08: 3000 mL

## 2012-06-08 MED ORDER — 0.9 % SODIUM CHLORIDE (POUR BTL) OPTIME
TOPICAL | Status: DC | PRN
  Administered 2012-06-08: 1000 mL

## 2012-06-08 MED ORDER — LACTATED RINGERS IV SOLN
INTRAVENOUS | Status: DC
  Administered 2012-06-08 (×3): via INTRAVENOUS

## 2012-06-08 MED ORDER — POTASSIUM CHLORIDE IN NACL 20-0.45 MEQ/L-% IV SOLN
INTRAVENOUS | Status: DC
  Administered 2012-06-09 – 2012-06-10 (×2): 75 mL/h via INTRAVENOUS
  Filled 2012-06-08 (×8): qty 1000

## 2012-06-08 MED ORDER — HYDROMORPHONE HCL PF 1 MG/ML IJ SOLN
INTRAMUSCULAR | Status: AC
  Filled 2012-06-08: qty 1

## 2012-06-08 MED ORDER — ETOMIDATE 2 MG/ML IV SOLN
INTRAVENOUS | Status: DC | PRN
  Administered 2012-06-08: 8 mg via INTRAVENOUS

## 2012-06-08 MED ORDER — ACETAMINOPHEN 10 MG/ML IV SOLN
1000.0000 mg | Freq: Once | INTRAVENOUS | Status: AC | PRN
  Administered 2012-06-08: 1000 mg via INTRAVENOUS

## 2012-06-08 MED ORDER — KETAMINE HCL 100 MG/ML IJ SOLN
INTRAMUSCULAR | Status: AC
  Filled 2012-06-08: qty 1

## 2012-06-08 MED ORDER — ACETAMINOPHEN 10 MG/ML IV SOLN: 1000.0000 mg | Freq: Once | INTRAVENOUS | Status: AC | PRN

## 2012-06-08 MED ORDER — KETAMINE HCL 10 MG/ML IJ SOLN
INTRAMUSCULAR | Status: DC | PRN
  Administered 2012-06-08: 10 mg via INTRAVENOUS
  Administered 2012-06-08: 20 mg via INTRAVENOUS
  Administered 2012-06-08: 10 mg via INTRAVENOUS
  Administered 2012-06-08 (×2): 20 mg via INTRAVENOUS
  Administered 2012-06-08: 10 mg via INTRAVENOUS
  Administered 2012-06-08 (×2): 20 mg via INTRAVENOUS
  Administered 2012-06-08: 10 mg via INTRAVENOUS
  Administered 2012-06-08 (×2): 20 mg via INTRAVENOUS
  Administered 2012-06-08 (×2): 10 mg via INTRAVENOUS
  Administered 2012-06-08: 20 mg via INTRAVENOUS

## 2012-06-08 MED ORDER — ACETAMINOPHEN 10 MG/ML IV SOLN
INTRAVENOUS | Status: AC
  Filled 2012-06-08: qty 100

## 2012-06-08 MED ORDER — ONDANSETRON HCL 4 MG/2ML IJ SOLN
4.0000 mg | Freq: Once | INTRAMUSCULAR | Status: AC | PRN
  Filled 2012-06-08: qty 2

## 2012-06-08 MED ORDER — DEXMEDETOMIDINE HCL IN NACL 200 MCG/50ML IV SOLN
4.0000 ug/h | INTRAVENOUS | Status: DC
  Administered 2012-06-08: .3 ug/kg/h via INTRAVENOUS
  Filled 2012-06-08: qty 50

## 2012-06-08 MED ORDER — SODIUM CHLORIDE 0.9 % IV SOLN
INTRAVENOUS | Status: DC | PRN
  Administered 2012-06-08: 21:00:00 via INTRAVENOUS

## 2012-06-08 SURGICAL SUPPLY — 69 items
BANDAGE ELASTIC 4 VELCRO ST LF (GAUZE/BANDAGES/DRESSINGS) ×1 IMPLANT
BANDAGE ELASTIC 6 VELCRO ST LF (GAUZE/BANDAGES/DRESSINGS) ×3 IMPLANT
BANDAGE ESMARK 6X9 LF (GAUZE/BANDAGES/DRESSINGS) IMPLANT
BIT DRILL GUIDEWIRE 2.5X200 (WIRE) ×2 IMPLANT
BNDG CMPR 9X6 STRL LF SNTH (GAUZE/BANDAGES/DRESSINGS)
BNDG ESMARK 6X9 LF (GAUZE/BANDAGES/DRESSINGS)
BOOTCOVER CLEANROOM LRG (PROTECTIVE WEAR) ×3 IMPLANT
CABLE 1.7 (Orthopedic Implant) ×5 IMPLANT
CLEANER TIP ELECTROSURG 2X2 (MISCELLANEOUS) ×1 IMPLANT
CLOTH BEACON ORANGE TIMEOUT ST (SAFETY) ×2 IMPLANT
COVER MAYO STAND STRL (DRAPES) ×1 IMPLANT
COVER SURGICAL LIGHT HANDLE (MISCELLANEOUS) ×2 IMPLANT
CUFF TOURNIQUET SINGLE 34IN LL (TOURNIQUET CUFF) IMPLANT
CUFF TOURNIQUET SINGLE 44IN (TOURNIQUET CUFF) IMPLANT
DECANTER SPIKE VIAL GLASS SM (MISCELLANEOUS) IMPLANT
DRAPE C-ARM 42X72 X-RAY (DRAPES) ×1 IMPLANT
DRAPE OEC MINIVIEW 54X84 (DRAPES) IMPLANT
DRAPE U-SHAPE 47X51 STRL (DRAPES) ×1 IMPLANT
DRSG PAD ABDOMINAL 8X10 ST (GAUZE/BANDAGES/DRESSINGS) ×2 IMPLANT
DURAPREP 26ML APPLICATOR (WOUND CARE) ×2 IMPLANT
ELECT REM PT RETURN 9FT ADLT (ELECTROSURGICAL) ×2
ELECTRODE REM PT RTRN 9FT ADLT (ELECTROSURGICAL) ×1 IMPLANT
GAUZE XEROFORM 1X8 LF (GAUZE/BANDAGES/DRESSINGS) ×4 IMPLANT
GAUZE XEROFORM 5X9 LF (GAUZE/BANDAGES/DRESSINGS) ×1 IMPLANT
GLOVE BIOGEL PI IND STRL 8 (GLOVE) ×1 IMPLANT
GLOVE BIOGEL PI INDICATOR 8 (GLOVE) ×3
GLOVE ORTHO TXT STRL SZ7.5 (GLOVE) ×5 IMPLANT
GLOVE SURG ORTHO 8.0 STRL STRW (GLOVE) ×4 IMPLANT
GOWN STRL NON-REIN LRG LVL3 (GOWN DISPOSABLE) ×3 IMPLANT
HANDPIECE INTERPULSE COAX TIP (DISPOSABLE) ×2
IMMOBILIZER KNEE 20 (SOFTGOODS) ×2
IMMOBILIZER KNEE 20 THIGH 36 (SOFTGOODS) IMPLANT
KIT BASIN OR (CUSTOM PROCEDURE TRAY) ×2 IMPLANT
KIT ROOM TURNOVER OR (KITS) ×2 IMPLANT
MANIFOLD NEPTUNE II (INSTRUMENTS) ×1 IMPLANT
NS IRRIG 1000ML POUR BTL (IV SOLUTION) ×2 IMPLANT
PACK ORTHO EXTREMITY (CUSTOM PROCEDURE TRAY) ×2 IMPLANT
PAD ARMBOARD 7.5X6 YLW CONV (MISCELLANEOUS) ×4 IMPLANT
PAD CAST 4YDX4 CTTN HI CHSV (CAST SUPPLIES) ×2 IMPLANT
PADDING CAST ABS 6INX4YD NS (CAST SUPPLIES) ×2
PADDING CAST ABS COTTON 6X4 NS (CAST SUPPLIES) IMPLANT
PADDING CAST COTTON 4X4 STRL (CAST SUPPLIES) ×4
PADDING CAST COTTON 6X4 STRL (CAST SUPPLIES) ×1 IMPLANT
PIN POSITIONING THREADED (PIN) ×5 IMPLANT
PLATE CONDYLAR LCP 12H RIGHT (Plate) ×1 IMPLANT
SCREW 7.3 CANN LK 70 (Screw) ×1 IMPLANT
SCREW CANN LOCK 5.0X70 (Screw) ×1 IMPLANT
SCREW CONICAL LOCK 5.0MM 40MM (Screw) ×2 IMPLANT
SCREW CONICAL THREADED 7.3X65M (Screw) ×1 IMPLANT
SCREW LOCKING 5.0MM 35MM (Screw) ×2 IMPLANT
SET HNDPC FAN SPRY TIP SCT (DISPOSABLE) IMPLANT
SPONGE GAUZE 4X4 12PLY (GAUZE/BANDAGES/DRESSINGS) ×2 IMPLANT
SPONGE LAP 18X18 X RAY DECT (DISPOSABLE) ×3 IMPLANT
SPONGE LAP 4X18 X RAY DECT (DISPOSABLE) ×4 IMPLANT
STAPLER VISISTAT 35W (STAPLE) ×3 IMPLANT
SUCTION FRAZIER TIP 10 FR DISP (SUCTIONS) ×2 IMPLANT
SUT VIC AB 0 CTX 36 (SUTURE) ×2
SUT VIC AB 0 CTX36XBRD ANTBCTR (SUTURE) IMPLANT
SUT VIC AB 1 CT1 27 (SUTURE) ×6
SUT VIC AB 1 CT1 27XBRD ANBCTR (SUTURE) IMPLANT
SUT VIC AB 2-0 CT1 27 (SUTURE) ×6
SUT VIC AB 2-0 CT1 TAPERPNT 27 (SUTURE) IMPLANT
SYR CONTROL 10ML LL (SYRINGE) IMPLANT
TOWEL OR 17X24 6PK STRL BLUE (TOWEL DISPOSABLE) ×2 IMPLANT
TOWEL OR 17X26 10 PK STRL BLUE (TOWEL DISPOSABLE) ×2 IMPLANT
TUBE CONNECTING 12X1/4 (SUCTIONS) ×2 IMPLANT
UNDERPAD 30X30 INCONTINENT (UNDERPADS AND DIAPERS) ×1 IMPLANT
WATER STERILE IRR 1000ML POUR (IV SOLUTION) ×1 IMPLANT
YANKAUER SUCT BULB TIP NO VENT (SUCTIONS) ×1 IMPLANT

## 2012-06-08 NOTE — Anesthesia Procedure Notes (Addendum)
Spinal   Spinal  Patient location during procedure: OR Start time: 06/08/2012 4:41 PM End time: 06/08/2012 4:46 PM Staffing Performed by: anesthesiologist  Preanesthetic Checklist Completed: patient identified, site marked, surgical consent, pre-op evaluation, timeout performed, IV checked, risks and benefits discussed and monitors and equipment checked Spinal Block Patient position: right lateral decubitus Prep: ChloraPrep Patient monitoring: heart rate, cardiac monitor, continuous pulse ox and blood pressure Approach: right paramedian Location: L3-4 Injection technique: single-shot Needle Needle gauge: 22 G Needle length: 9 cm Needle insertion depth: 5 cm Assessment Sensory level: T12 Additional Notes 0.75% Marcaine injected, clear CSF.  Kipp Brood, MD

## 2012-06-08 NOTE — Op Note (Signed)
06/08/2012  9:28 PM  PATIENT:  [name]    PRE-OPERATIVE DIAGNOSIS:  Right Supracondylar periprosthetic Femur Fracture  POST-OPERATIVE DIAGNOSIS:  Same  PROCEDURE:  Open reduction internal fixation right supracondylar periprosthetic femur fracture with plating and cerclage wiring  SURGEON:  Eulas Post, MD  PHYSICIAN ASSISTANT: Skip Mayer, PA-C, present and scrubbed during the operation assisting with instrumentation, reduction, and closure.  ANESTHESIA:   Spinal, converted to General  PREOPERATIVE INDICATIONS:  [name] is a  78 y.o. female with a diagnosis of Right Supracondylar Femur Fracture who failed conservative measures and elected for surgical management. Initially we treated her nonoperatively, based on her age and risk factors, however she elected for surgical management ultimately because she wanted to regain as normal angulatory function as possible.    The risks benefits and alternatives were discussed with the patient preoperatively including but not limited to the risks of infection, bleeding, nerve injury, cardiopulmonary complications, the need for revision surgery, among others, and the patient was willing to proceed.  OPERATIVE IMPLANTS: Synthes distal femoral periarticular locking plate with a total of 4 distal locking screws, and one proximal unicortical locking screw, with multiple cerclage wires crossing the fracture and extending above the fracture.  OPERATIVE FINDINGS: Osteopenia, displaced supracondylar femur fracture, which appeared to exit through the distal tip of the nail, and the lateral cortex at the interlocking screw was intact, and I'm not sure if the fracture was from the medial interlocking screw, or from the distal nail tip.  OPERATIVE PROCEDURE: The patient is brought to the operating room placed in supine position. Regional anesthesia was administered, although about an hour and 45 minutes into the case the anesthetic stop  working, and she was converted to a general anesthetic.. IV antibiotics were given. Time out was performed. The right lower extremity is prepped and draped in usual sterile fashion. Lateral incision was made, extending down through the fascia, elevating the vastus lateralis anteriorly. The fracture was identified and mobilized. It are recreated a fair amount of callus and was fairly adherent. I mobilized this, and ultimately was able to reduce it anatomically, held it with a clamp. I applied a distal femoral locking plate. I had to go fairly long, because the fracture extended up fairly proximally.   I secured the plate guidepins first, and confirmed a satisfied with its position on AP and lateral views, and then secured the plate distally with a single 7.5 mm nonlocking screw first, in order to gain compression of the plate, and then secured it with multiple locking screws and then exchanged a nonlocking screw for a locking screw.  I then confirmed anatomic reduction on AP and lateral views as best as possible, and then placed cerclage wires around the bone, initially distally, and then gradually work my way proximal. I secured the plate to the bone proximally with a single locking unicortical screw, and then finished the proximal fixation with cerclage wires. Care was taken to maintain adherence to the posterior aspect of the bone in order to minimize risk to the sciatic nerve. I was able to achieve excellent fixation both proximally and distally, and anatomic alignment with good bony apposition. Rotational alignment was also confirmed that I was able to directly visualiz anatomically across the front of the knee.  After satisfactory fixation was achieved, I had confirmed that I bypass the fracture site adequately, and then irrigated copiously using pulse lavage, and repaired the fascia of the iliotibial band with #1 Vicryl followed by oh and  then 20 for the subcutaneous tissue followed by staples for the  skin. Sterile gauze was applied. A knee immobilizer was placed. She was awakened and returned to PACU in stable and satisfactory condition. There no complications and she tolerated the procedure well.  She did receive one unit of packed red blood cells intraoperatively, as her hemoglobin went from 11 down to 8, and we will expect ongoing blood loss. We'll plan to recheck hemoglobin tomorrow morning.

## 2012-06-08 NOTE — H&P (Signed)
PREOPERATIVE H&P  Chief Complaint: Right Supracondylar Femur Fracture  HPI: [name] is a 78 y.o. female who presents for preoperative history and physical with a diagnosis of Right Supracondylar Femur Fracture that occurred a couple of weeks ago, after a twisting injury. She has a previous hip fracture that had an intramedullary nail placed, and she fractured around her distal interlocking screw.. Symptoms are rated as moderate to severe, and have been worsening.  This is significantly impairing activities of daily living.  She has elected for surgical management. Initially she elected for nonsurgical management, however she is concerned because her angulatory function long term may be compromised without surgery. Her leg is in varus, and shortened, and has risk for nonunion as well as malunion if left as is. She wants to be able to regain her previous level of independent ambulation.  Past Medical History  Diagnosis Date  . Aortic valve regurgitation   . Hyperlipidemia   . Mitral valve disorder   . Pacemaker     For complete heart block, s/p generator change 09/2010   . Dyspnea   . Anxiety   . Depression   . Aneurysm of abdominal aorta     4.4 cm in 09/2010  . Closed intertrochanteric fracture of right hip 07/13/2011  . CHF (congestive heart failure)   . Left bundle branch block     dr Arlyn Leak   Past Surgical History  Procedure Date  . Replacement total knee 2008    left by Dr. Marciano Sequin  . Pacemaker insertion 2006    complete heart block  . Insert / replace / remove pacemaker 2012    upgrade to biventricular  . Femur im nail 07/14/2011    Procedure: INTRAMEDULLARY (IM) NAIL FEMORAL;  Surgeon: Eulas Post;  Location: [facility] OR;  Service: Orthopedics;  Laterality: Right;  Synthes TFN, fracture table, big C arm, available after 4p  . Left ingunial hernia   . Laparotomy 11/11/2011    Procedure: EXPLORATORY LAPAROTOMY;  Surgeon: Shelly Rubenstein, MD;  Location: [facility] OR;  Service:  General;  Laterality: N/A;  . Bowel resection 11/11/2011    Procedure: SMALL BOWEL RESECTION;  Surgeon: Shelly Rubenstein, MD;  Location: [facility] OR;  Service: General;  Laterality: N/A;  . Inguinal hernia repair 11/11/2011    Procedure: HERNIA REPAIR INGUINAL ADULT;  Surgeon: Shelly Rubenstein, MD;  Location: [facility] OR;  Service: General;  Laterality: Left;  . Abdominal aortic aneurysm repair   . Fell   . Joint replacement   . Hernia repair    History   Social History  . Marital Status: Married    Spouse Name: N/A    Number of Children: N/A  . Years of Education: N/A   Social History Main Topics  . Smoking status: Never Smoker   . Smokeless tobacco: Never Used  . Alcohol Use: No  . Drug Use: No  . Sexually Active: None   Other Topics Concern  . None   Social History Narrative   Regular exercise- yes. Retired and married.   Husband has significant disabilities and needs   Family History  Problem Relation Age of Onset  . Other Brother     AAA   Allergies  Allergen Reactions  . Ace Inhibitors Other (See Comments)    unknown  . Atorvastatin Other (See Comments)    unknown  . Other     " Mind [facility] Drugs" caretaker says it can be "disastrous."   Prior to Admission medications  Medication Sig Start Date End Date Taking? Authorizing Provider  acetaminophen (TYLENOL) 500 MG tablet Take 2 tablets (1,000 mg total) by mouth every 6 (six) hours as needed. For arthritis pain 05/21/12  Yes Georgann Housekeeper, MD  aspirin 81 MG tablet Take 81 mg by mouth daily.     Yes Historical Provider, MD  carvedilol (COREG) 6.25 MG tablet Take 6.25 mg by mouth 2 (two) times daily.     Yes Historical Provider, MD  cholecalciferol (VITAMIN D) 1000 UNITS tablet Take 1,000 Units by mouth daily.     Yes Historical Provider, MD  furosemide (LASIX) 20 MG tablet Take 20 mg by mouth 2 (two) times daily.    Yes Historical Provider, MD  Glucosamine 500 MG CAPS Take 1,500 mg by mouth daily.    Yes Historical  Provider, MD  Lactobacillus (ACIDOPHILUS PO) Take 1 tablet by mouth daily.   Yes Historical Provider, MD  losartan (COZAAR) 50 MG tablet Take 25 mg by mouth daily.    Yes Historical Provider, MD  Lutein 20 MG CAPS Take 20 mg by mouth daily.    Yes Historical Provider, MD  Multiple Vitamins-Minerals (MULTIVITAL) tablet Take 1 tablet by mouth daily.    Yes Historical Provider, MD  oxybutynin (DITROPAN-XL) 10 MG 24 hr tablet Take 10 mg by mouth every other day.    Yes Historical Provider, MD  rivaroxaban (XARELTO) 10 MG TABS tablet Take 10 mg by mouth daily.   Yes Historical Provider, MD  simvastatin (ZOCOR) 20 MG tablet Take 20 mg by mouth daily.    Yes Historical Provider, MD  rivaroxaban (XARELTO) 10 MG TABS tablet Take 1 tablet (10 mg total) by mouth daily. 05/22/12 06/05/12  Hal Stormy Fabian, MD     Positive ROS: All other systems have been reviewed and were otherwise negative with the exception of those mentioned in the HPI and as above.  Physical Exam: General: Alert, no acute distress Cardiovascular: MILD pedal edema Respiratory: No cyanosis, no use of accessory musculature GI: No organomegaly, abdomen is soft and non-tender Skin: No lesions in the area of chief complaint Neurologic: Sensation intact distally Psychiatric: Patient is competent for consent with normal mood and affect Lymphatic: No axillary or cervical lymphadenopathy  MUSCULOSKELETAL: RIGHT LEG WITH SHORTENING and angulation, EHL and FHL are firing. She has some bruising around the right distal femur. She is unable to do a straight leg raise. She has pain to palpation in this location as well.  Assessment: Right Supracondylar Femur Fracture, periprosthetic around a intramedullary nail  Plan: Plan for Procedure(s): OPEN REDUCTION INTERNAL FIXATION (ORIF) RIGHT SUPRACONDYLAR FEMUR FRACTURE  The risks benefits and alternatives were discussed with the patient including but not limited to the risks of nonoperative  treatment, versus surgical intervention including infection, bleeding, nerve injury,  blood clots, cardiopulmonary complications, morbidity, mortality, among others, and they were willing to proceed. We're going to try and regain ambulatory function, and therefore having a leg that is the appropriate length and rotational alignment and having an appropriate union would optimize her chances. We will plan to proceed.  Christelle Igoe P, MD Cell [phone] Pager [phone]  06/08/2012 4:10 PM

## 2012-06-08 NOTE — Transfer of Care (Signed)
Immediate Anesthesia Transfer of Care Note  Patient: [name]  Procedure(s) Performed: Procedure(s) (LRB) with comments: OPEN REDUCTION INTERNAL FIXATION (ORIF) TIBIAL PLATEAU (Right) - ORIF of a Right Supracondylar Femur Fracture  Patient Location: PACU  Anesthesia Type: General and Regional  Level of Consciousness: sedated  Airway & Oxygen Therapy: Patient Spontanous Breathing and Patient connected to nasal cannula oxygen  Post-op Assessment: Report given to PACU RN and Post -op Vital signs reviewed and stable  Post vital signs: Reviewed and stable  Complications: No apparent anesthesia complications

## 2012-06-08 NOTE — Progress Notes (Signed)
SPOKE WITH DR Noreene Larsson ABOUT PATIENT HAVING A PACEMAKER , HE STATED WE DID NOT NEED TO CONTACT [employer]. (DR Donnie Aho IS OUT OF TOWN TIL Thursday PER HIS NURSE KATHY.

## 2012-06-08 NOTE — Progress Notes (Signed)
Patient arrived in OR holding area with no iv intact. 

## 2012-06-08 NOTE — Preoperative (Signed)
Beta Blockers   Reason not to administer Beta Blockers:Not Applicable 

## 2012-06-09 ENCOUNTER — Encounter (HOSPITAL_COMMUNITY): Payer: Self-pay | Admitting: *Deleted

## 2012-06-09 DIAGNOSIS — Z79899 Other long term (current) drug therapy: Secondary | ICD-10-CM | POA: Diagnosis not present

## 2012-06-09 DIAGNOSIS — E785 Hyperlipidemia, unspecified: Secondary | ICD-10-CM | POA: Diagnosis present

## 2012-06-09 DIAGNOSIS — Z950 Presence of cardiac pacemaker: Secondary | ICD-10-CM | POA: Diagnosis not present

## 2012-06-09 DIAGNOSIS — F411 Generalized anxiety disorder: Secondary | ICD-10-CM | POA: Diagnosis present

## 2012-06-09 DIAGNOSIS — S7290XA Unspecified fracture of unspecified femur, initial encounter for closed fracture: Secondary | ICD-10-CM | POA: Diagnosis not present

## 2012-06-09 DIAGNOSIS — F329 Major depressive disorder, single episode, unspecified: Secondary | ICD-10-CM | POA: Diagnosis present

## 2012-06-09 DIAGNOSIS — M79609 Pain in unspecified limb: Secondary | ICD-10-CM | POA: Diagnosis not present

## 2012-06-09 DIAGNOSIS — I714 Abdominal aortic aneurysm, without rupture: Secondary | ICD-10-CM | POA: Diagnosis present

## 2012-06-09 DIAGNOSIS — T84049A Periprosthetic fracture around unspecified internal prosthetic joint, initial encounter: Secondary | ICD-10-CM | POA: Diagnosis present

## 2012-06-09 DIAGNOSIS — Z7901 Long term (current) use of anticoagulants: Secondary | ICD-10-CM | POA: Diagnosis not present

## 2012-06-09 DIAGNOSIS — Z96659 Presence of unspecified artificial knee joint: Secondary | ICD-10-CM | POA: Diagnosis not present

## 2012-06-09 DIAGNOSIS — D62 Acute posthemorrhagic anemia: Secondary | ICD-10-CM | POA: Diagnosis not present

## 2012-06-09 DIAGNOSIS — Z7982 Long term (current) use of aspirin: Secondary | ICD-10-CM | POA: Diagnosis not present

## 2012-06-09 DIAGNOSIS — Z01812 Encounter for preprocedural laboratory examination: Secondary | ICD-10-CM | POA: Diagnosis not present

## 2012-06-09 LAB — POCT I-STAT 4, (NA,K, GLUC, HGB,HCT)
HCT: 26 % — ABNORMAL LOW (ref 36.0–46.0)
Hemoglobin: 10.5 g/dL — ABNORMAL LOW (ref 12.0–15.0)
Hemoglobin: 8.8 g/dL — ABNORMAL LOW (ref 12.0–15.0)
Potassium: 4.5 mEq/L (ref 3.5–5.1)
Sodium: 142 mEq/L (ref 135–145)

## 2012-06-09 LAB — BASIC METABOLIC PANEL
BUN: 29 mg/dL — ABNORMAL HIGH (ref 6–23)
Chloride: 104 mEq/L (ref 96–112)
Glucose, Bld: 180 mg/dL — ABNORMAL HIGH (ref 70–99)
Potassium: 4.6 mEq/L (ref 3.5–5.1)

## 2012-06-09 LAB — CBC
HCT: 29.4 % — ABNORMAL LOW (ref 36.0–46.0)
Hemoglobin: 9.9 g/dL — ABNORMAL LOW (ref 12.0–15.0)
MCHC: 33.7 g/dL (ref 30.0–36.0)
MCV: 91.3 fL (ref 78.0–100.0)
WBC: 11.7 10*3/uL — ABNORMAL HIGH (ref 4.0–10.5)

## 2012-06-09 MED ORDER — SORBITOL 70 % SOLN: 30.0000 mL | Freq: Every day | Status: DC | PRN

## 2012-06-09 MED ORDER — VITAMIN D3 25 MCG (1000 UNIT) PO TABS
1000.0000 [IU] | ORAL_TABLET | Freq: Every day | ORAL | Status: DC
  Administered 2012-06-09 – 2012-06-11 (×3): 1000 [IU] via ORAL
  Filled 2012-06-09 (×3): qty 1

## 2012-06-09 MED ORDER — HYDROMORPHONE HCL PF 1 MG/ML IJ SOLN
INTRAMUSCULAR | Status: AC
  Filled 2012-06-09: qty 1

## 2012-06-09 MED ORDER — FUROSEMIDE 20 MG PO TABS
20.0000 mg | ORAL_TABLET | Freq: Two times a day (BID) | ORAL | Status: DC
  Administered 2012-06-09 – 2012-06-11 (×3): 20 mg via ORAL
  Filled 2012-06-09 (×8): qty 1

## 2012-06-09 MED ORDER — OCUVITE-LUTEIN PO CAPS
1.0000 | ORAL_CAPSULE | Freq: Every day | ORAL | Status: DC
  Administered 2012-06-09 – 2012-06-11 (×3): 1 via ORAL
  Filled 2012-06-09 (×3): qty 1

## 2012-06-09 MED ORDER — ALUM & MAG HYDROXIDE-SIMETH 200-200-20 MG/5ML PO SUSP: 30.0000 mL | ORAL | Status: DC | PRN

## 2012-06-09 MED ORDER — ONDANSETRON HCL 4 MG/2ML IJ SOLN
4.0000 mg | Freq: Four times a day (QID) | INTRAMUSCULAR | Status: DC | PRN
  Administered 2012-06-09 (×2): 4 mg via INTRAVENOUS
  Filled 2012-06-09: qty 2

## 2012-06-09 MED ORDER — ACETAMINOPHEN 650 MG RE SUPP: 650.0000 mg | Freq: Four times a day (QID) | RECTAL | Status: DC | PRN

## 2012-06-09 MED ORDER — OXYBUTYNIN CHLORIDE ER 10 MG PO TB24
10.0000 mg | ORAL_TABLET | ORAL | Status: DC
  Administered 2012-06-09 – 2012-06-11 (×2): 10 mg via ORAL
  Filled 2012-06-09 (×2): qty 1

## 2012-06-09 MED ORDER — HYDROCODONE-ACETAMINOPHEN 5-325 MG PO TABS
1.0000 | ORAL_TABLET | Freq: Four times a day (QID) | ORAL | Status: DC | PRN
  Administered 2012-06-09 (×2): 1 via ORAL
  Administered 2012-06-10 – 2012-06-11 (×3): 2 via ORAL
  Filled 2012-06-09 (×4): qty 2
  Filled 2012-06-09: qty 1

## 2012-06-09 MED ORDER — ONDANSETRON HCL 4 MG PO TABS: 4.0000 mg | ORAL_TABLET | Freq: Four times a day (QID) | ORAL | Status: DC | PRN

## 2012-06-09 MED ORDER — RIVAROXABAN 10 MG PO TABS
10.0000 mg | ORAL_TABLET | Freq: Every day | ORAL | Status: DC
  Administered 2012-06-10: 10 mg via ORAL
  Filled 2012-06-09 (×3): qty 1

## 2012-06-09 MED ORDER — SENNA 8.6 MG PO TABS
1.0000 | ORAL_TABLET | Freq: Two times a day (BID) | ORAL | Status: DC
  Administered 2012-06-09 – 2012-06-11 (×5): 8.6 mg via ORAL
  Filled 2012-06-09 (×7): qty 1

## 2012-06-09 MED ORDER — MENTHOL 3 MG MT LOZG: 1.0000 | LOZENGE | OROMUCOSAL | Status: DC | PRN

## 2012-06-09 MED ORDER — ASPIRIN 81 MG PO TABS: 81.0000 mg | ORAL_TABLET | Freq: Every day | ORAL | Status: DC

## 2012-06-09 MED ORDER — LUTEIN 20 MG PO CAPS: 20.0000 mg | ORAL_CAPSULE | Freq: Every day | ORAL | Status: DC

## 2012-06-09 MED ORDER — DOCUSATE SODIUM 100 MG PO CAPS
100.0000 mg | ORAL_CAPSULE | Freq: Two times a day (BID) | ORAL | Status: DC
  Administered 2012-06-09 – 2012-06-11 (×5): 100 mg via ORAL
  Filled 2012-06-09 (×6): qty 1

## 2012-06-09 MED ORDER — SIMVASTATIN 20 MG PO TABS
20.0000 mg | ORAL_TABLET | Freq: Every day | ORAL | Status: DC
  Administered 2012-06-10: 20 mg via ORAL
  Filled 2012-06-09 (×3): qty 1

## 2012-06-09 MED ORDER — ASPIRIN 81 MG PO CHEW
81.0000 mg | CHEWABLE_TABLET | Freq: Every day | ORAL | Status: DC
  Administered 2012-06-09 – 2012-06-11 (×3): 81 mg via ORAL
  Filled 2012-06-09 (×3): qty 1

## 2012-06-09 MED ORDER — POLYETHYLENE GLYCOL 3350 17 G PO PACK: 17.0000 g | PACK | Freq: Every day | ORAL | Status: DC | PRN

## 2012-06-09 MED ORDER — ONDANSETRON HCL 4 MG/2ML IJ SOLN: 4.0000 mg | Freq: Once | INTRAMUSCULAR | Status: DC | PRN

## 2012-06-09 MED ORDER — CARVEDILOL 6.25 MG PO TABS
6.2500 mg | ORAL_TABLET | Freq: Two times a day (BID) | ORAL | Status: DC
  Administered 2012-06-09 – 2012-06-11 (×4): 6.25 mg via ORAL
  Filled 2012-06-09 (×7): qty 1

## 2012-06-09 MED ORDER — METOCLOPRAMIDE HCL 10 MG PO TABS: 5.0000 mg | ORAL_TABLET | Freq: Three times a day (TID) | ORAL | Status: DC | PRN

## 2012-06-09 MED ORDER — HYDROMORPHONE HCL PF 1 MG/ML IJ SOLN: 0.2500 mg | INTRAMUSCULAR | Status: DC | PRN

## 2012-06-09 MED ORDER — PHENOL 1.4 % MT LIQD: 1.0000 | OROMUCOSAL | Status: DC | PRN

## 2012-06-09 MED ORDER — ACETAMINOPHEN 325 MG PO TABS
650.0000 mg | ORAL_TABLET | Freq: Four times a day (QID) | ORAL | Status: DC | PRN
  Administered 2012-06-09 – 2012-06-11 (×4): 650 mg via ORAL
  Filled 2012-06-09 (×4): qty 2

## 2012-06-09 MED ORDER — LOSARTAN POTASSIUM 25 MG PO TABS
25.0000 mg | ORAL_TABLET | Freq: Every day | ORAL | Status: DC
  Administered 2012-06-09 – 2012-06-11 (×3): 25 mg via ORAL
  Filled 2012-06-09 (×3): qty 1

## 2012-06-09 MED ORDER — METOCLOPRAMIDE HCL 5 MG/ML IJ SOLN
5.0000 mg | Freq: Three times a day (TID) | INTRAMUSCULAR | Status: DC | PRN
  Administered 2012-06-09: 10 mg via INTRAVENOUS
  Filled 2012-06-09: qty 2

## 2012-06-09 MED ORDER — MORPHINE SULFATE 2 MG/ML IJ SOLN
0.5000 mg | INTRAMUSCULAR | Status: DC | PRN
  Administered 2012-06-09 – 2012-06-10 (×2): 0.5 mg via INTRAVENOUS
  Filled 2012-06-09 (×2): qty 1

## 2012-06-09 NOTE — Progress Notes (Signed)
PT/OT Cancellation Note  Patient Details Name: [name] MRN: [id number] DOB: 1920/02/08   Cancelled Treatment:    Reason Eval/Treat Not Completed:  (Pt has adamantly refused x 2 to get OOB. )  Pt refused PT/OT evaluations today stating "I'm too tired.  I need my rest."  Educated pt on the importance of overall mobility and to prepare for safe d/c home.  Pt continues to adamantly refuse.  Will attempt later today if time allows.   Francois Elk 06/09/2012, 12:01 PM Jake Shark, PT DPT [phone]

## 2012-06-09 NOTE — Progress Notes (Signed)
Subjective:  Patient reports pain as moderate.  Feels weak.  Objective:   VITALS:   Filed Vitals:   06/09/12 0000 06/09/12 0042 06/09/12 0306 06/09/12 0713  BP: 112/49   125/63  Pulse: 93   92  Temp: 97.7 F (36.5 C)   97.8 F (36.6 C)  TempSrc:      Resp: 19  20 20   Height: 4\' 11"  (1.499 m)     Weight: 50.349 kg (111 lb)     SpO2: 100% 100% 96% 100%    Neurologically intact ABD soft Intact pulses distally Dorsiflexion/Plantar flexion intact  LABS  Results for orders placed during the hospital encounter of 06/08/12 (from the past 24 hour(s))  APTT     Status: Normal   Collection Time   06/08/12 12:55 PM      Component Value Range   aPTT 30  24 - 37 seconds  PROTIME-INR     Status: Normal   Collection Time   06/08/12 12:55 PM      Component Value Range   Prothrombin Time 13.8  11.6 - 15.2 seconds   INR 1.07  0.00 - 1.49  URINALYSIS, ROUTINE W REFLEX MICROSCOPIC     Status: Abnormal   Collection Time   06/08/12  1:10 PM      Component Value Range   Color, Urine YELLOW  YELLOW   APPearance TURBID (*) CLEAR   Specific Gravity, Urine 1.023  1.005 - 1.030   pH 6.5  5.0 - 8.0   Glucose, UA NEGATIVE  NEGATIVE mg/dL   Hgb urine dipstick SMALL (*) NEGATIVE   Bilirubin Urine NEGATIVE  NEGATIVE   Ketones, ur NEGATIVE  NEGATIVE mg/dL   Protein, ur 30 (*) NEGATIVE mg/dL   Urobilinogen, UA 0.2  0.0 - 1.0 mg/dL   Nitrite POSITIVE (*) NEGATIVE   Leukocytes, UA LARGE (*) NEGATIVE  URINE MICROSCOPIC-ADD ON     Status: Abnormal   Collection Time   06/08/12  1:10 PM      Component Value Range   Squamous Epithelial / LPF FEW (*) RARE   WBC, UA TOO NUMEROUS TO COUNT  <3 WBC/hpf   RBC / HPF 3-6  <3 RBC/hpf   Bacteria, UA MANY (*) RARE    Dg Femur Right  06/08/2012  *RADIOLOGY REPORT*  Clinical Data: Right femoral fracture.  RIGHT FEMUR - 2 VIEW,DG C-ARM GT 120 MIN  Comparison: 05/18/2012  Findings: Remote rod and nail fixation of the proximal femur. Placement of a  lateral fixation device with cerclage wires. Improved alignment of the periprosthetic distal femoral fracture. Distal screw fixation as well.  Degenerative changes about the knee, primarily medially.  IMPRESSION: Intraoperative imaging of plate and cerclage wire/screw fixation of periprosthetic distal femur fracture.   Original Report Authenticated By: Consuello Bossier, M.D.    Dg Femur Right Port  06/08/2012  *RADIOLOGY REPORT*  Clinical Data: Postop of distal femoral fixation.  PORTABLE RIGHT FEMUR - 2 VIEW  Comparison: Intraoperative imaging earlier in the evening.  Findings: Rod and proximal screw fixation of the femur.  Since 05/18/2012, interval placement of a plate and screws/ cerclage wire fixation device laterally.  This traverses the previously described distal femoral fracture.  No acute hardware complication.  Fracture alignment is improved. No acute fracture identified.  IMPRESSION: Interval placement of plate and screw/cerclage wire fixation across the previously described distal femur fracture.   Original Report Authenticated By: Consuello Bossier, M.D.    Dg C-arm Leonia Reeves  120 Min  06/08/2012  *RADIOLOGY REPORT*  Clinical Data: Right femoral fracture.  RIGHT FEMUR - 2 VIEW,DG C-ARM GT 120 MIN  Comparison: 05/18/2012  Findings: Remote rod and nail fixation of the proximal femur. Placement of a lateral fixation device with cerclage wires. Improved alignment of the periprosthetic distal femoral fracture. Distal screw fixation as well.  Degenerative changes about the knee, primarily medially.  IMPRESSION: Intraoperative imaging of plate and cerclage wire/screw fixation of periprosthetic distal femur fracture.   Original Report Authenticated By: Consuello Bossier, M.D.     Assessment/Plan: 1 Day Post-Op   Principal Problem:  *Peri-prosthetic supracondylar fracture of femur   Advance diet Doing ok recheck H/H. Family wants to arrange for dc home later this week.   [name] 06/09/2012, 8:39  AM   Teryl Lucy, MD Cell [phone] Pager [phone]

## 2012-06-09 NOTE — Progress Notes (Signed)
Orthopedic Tech Progress Note Patient Details:  [name] 08-06-1920 [id number]  Patient ID: [name], female   DOB: 19-Feb-1920, 78 y.o.   MRN: [id number]   Shawnie Pons 06/09/2012, 9:58 AM Trapeze bar

## 2012-06-09 NOTE — Progress Notes (Signed)
06/09/2012 Deandrea Vanpelt Elizabeth OTR/L Pager [phone] Office [phone]  

## 2012-06-09 NOTE — Progress Notes (Signed)
Utilization review completed. Brandilynn Taormina, RN, BSN. 

## 2012-06-09 NOTE — Progress Notes (Signed)
CARE MANAGEMENT NOTE 06/09/2012  Patient:  PRIMAVERA, BYRUM   Account Number:  [id number]  Date Initiated:  06/09/2012  Documentation initiated by:  Vance Peper  Subjective/Objective Assessment:   78 yr old female admitted for right hip ORIF.     Action/Plan:   Progression of care and discharge planning. CM spoke with patient and Clydie Braun Dyers-pt's community case Production designer, theatre/television/film. patient will return home with 24/7 care as before hospitalization. Has DME. Will need EMS transport home.   Anticipated DC Date:  06/11/2012   Anticipated DC Plan:  HOME W HOME HEALTH SERVICES      DC Planning Services  CM consult      [facility] Choice  HOME HEALTH  Resumption Of Svcs/PTA Provider   Choice offered to / List presented to:  C-1 Patient        HH arranged  HH-2 PT      Status of service:  In process, will continue to follow Medicare Important Message given?   (If response is "NO", the following Medicare IM given date fields will be blank) Date Medicare IM given:   Date Additional Medicare IM given:    Discharge Disposition:  HOME W HOME HEALTH SERVICES  Per UR Regulation:    If discussed at Long Length of Stay Meetings, dates discussed:    Comments:

## 2012-06-09 NOTE — Anesthesia Postprocedure Evaluation (Signed)
  Anesthesia Post-op Note  Patient: [name]  Procedure(s) Performed: Procedure(s) (LRB) with comments: OPEN REDUCTION INTERNAL FIXATION (ORIF) TIBIAL PLATEAU (Right) - ORIF of a Right Supracondylar Femur Fracture  Patient Location: PACU  Anesthesia Type: General and Spinal  Level of Consciousness: awake, alert  and patient cooperative  Airway and Oxygen Therapy: Patient Spontanous Breathing and Patient connected to nasal cannula oxygen  Post-op Pain: none  Post-op Assessment: Post-op Vital signs reviewed, Patient's Cardiovascular Status Stable, Respiratory Function Stable, Patent Airway, No signs of Nausea or vomiting and Pain level controlled  Post-op Vital Signs: Reviewed and stable  Complications: No apparent anesthesia complications

## 2012-06-10 DIAGNOSIS — D62 Acute posthemorrhagic anemia: Secondary | ICD-10-CM

## 2012-06-10 LAB — BASIC METABOLIC PANEL
CO2: 26 mEq/L (ref 19–32)
Glucose, Bld: 121 mg/dL — ABNORMAL HIGH (ref 70–99)
Potassium: 4 mEq/L (ref 3.5–5.1)
Sodium: 132 mEq/L — ABNORMAL LOW (ref 135–145)

## 2012-06-10 LAB — CBC
Hemoglobin: 7.2 g/dL — ABNORMAL LOW (ref 12.0–15.0)
RBC: 2.33 MIL/uL — ABNORMAL LOW (ref 3.87–5.11)

## 2012-06-10 MED ORDER — SENNA-DOCUSATE SODIUM 8.6-50 MG PO TABS: 1.0000 | ORAL_TABLET | Freq: Every day | ORAL | Status: AC

## 2012-06-10 MED ORDER — RIVAROXABAN 10 MG PO TABS: 10.0000 mg | ORAL_TABLET | Freq: Every day | ORAL | Status: AC

## 2012-06-10 MED ORDER — DIPHENHYDRAMINE HCL 25 MG PO CAPS
25.0000 mg | ORAL_CAPSULE | Freq: Once | ORAL | Status: AC
  Administered 2012-06-10: 25 mg via ORAL
  Filled 2012-06-10: qty 1

## 2012-06-10 MED ORDER — HYDROCODONE-ACETAMINOPHEN 5-325 MG PO TABS: 1.0000 | ORAL_TABLET | Freq: Four times a day (QID) | ORAL | Status: AC | PRN

## 2012-06-10 MED ORDER — ACETAMINOPHEN 325 MG PO TABS: 650.0000 mg | ORAL_TABLET | Freq: Once | ORAL | Status: DC

## 2012-06-10 NOTE — Progress Notes (Signed)
Physical Therapy Evaluation    06/10/12 1132  PT Visit Information  Last PT Received On 06/10/12  Assistance Needed +2  PT Time Calculation  PT Start Time 1035  PT Stop Time 1105  PT Time Calculation (min) 30 min  Subjective Data  Subjective "This is not the way I do it at home.  I use a box to keep my leg elevated."  Patient Stated Goal Home  Precautions  Precautions Fall  Required Braces or Orthoses Knee Immobilizer - Right  Knee Immobilizer - Right On at all times  Restrictions  Weight Bearing Restrictions Yes  RLE Weight Bearing NWB  Home Living  Lives With Spouse  Available Help at Discharge Personal care attendant  Type of Home House  Home Access Ramped entrance  Home Layout One level  Bathroom Shower/Tub Walk-in shower  Bathroom Toilet Handicapped height  Bathroom Accessibility Yes  How Accessible Accessible via walker  Home Adaptive Equipment Bedside commode/3-in-1;Walker - rolling;Walker - four wheeled;Grab bars around toilet;Grab bars in shower  Additional Comments Pt has aide at home and describes "several people female and female" that can help. Pt did not provide specifics  Prior Function  Level of Independence Independent with assistive device(s)  Able to Take Stairs? No  Driving No  Vocation Retired  Advertising account planner  Overall Cognitive Status Impaired  Area of Impairment Problem solving;Awareness of errors;Executive functioning  Arousal/Alertness Awake/alert  Orientation Level Appears intact for tasks assessed  Behavior During Session Agitated (Pt easily agitated if not exactly done the same as home)  Awareness of Errors Other (comment);Assistance required to identify errors made (Pt unable to recognize that pt did not have on underwear.)  Awareness of Errors - Other Comments Pt attempted to take off underwear during 3n1 transfer when pt did not have underwear on.  Pt also continued to stated "This is not the way I do it at home.   I sat my leg on a box to do the transfer to the commode."  However pt unable to describe the box at home and continues to repeat herself.  Cognition - Other Comments pt unaware of void bladder in bed and lack of underwear  Right Upper Extremity Assessment  RUE ROM/Strength/Tone WFL for tasks assessed  Left Upper Extremity Assessment  LUE ROM/Strength/Tone WFL for tasks assessed  Right Lower Extremity Assessment  RLE ROM/Strength/Tone Unable to fully assess;Due to pain;Due to precautions  RLE ROM/Strength/Tone Deficits Moves ankle well. wearing KI.   Bed Mobility  Bed Mobility Supine to Sit;Sitting - Scoot to Edge of Bed  Supine to Sit 4: Min assist  Sitting - Scoot to [place] of Bed 4: Min assist  Details for Bed Mobility Assistance pt required (A) holding Rt LE during transfer  Transfers  Transfers Sit to Stand  Sit to Stand 1: +2 Total assist;From bed;With upper extremity assist;From elevated surface  Sit to Stand: Patient Percentage 50%  Stand to Sit 1: +2 Total assist;With upper extremity assist;To chair/3-in-1  Stand to Sit: Patient Percentage 50%  Details for Transfer Assistance pt is able to use bil UE to scoot back in chair with Rt LE supported. Pt positioned in chair with Rt LE elevated  Ambulation/Gait  Ambulation/Gait Assistance Not tested (comment)  Static Sitting Balance  Static Sitting - Balance Support Bilateral upper extremity supported;Feet unsupported  Static Sitting - Level of Assistance 3: Mod assist  PT - End of Session  Equipment Utilized During Treatment Gait belt  Activity Tolerance Patient  tolerated treatment well  Patient left in chair;with call bell/phone within reach;with family/visitor present  PT Assessment  Clinical Impression Statement Pt is 78 y/o female admitted for s/p right LE ORIF.  Pt sustained fx at previous distal IM nail hardware and procedure replaced hardware.  Pt will benefit from acute PT services to improve overall mobility and prepare for  safe d/c home with [facility] and family.    PT Recommendation/Assessment Patient needs continued PT services  PT Problem List Decreased strength;Decreased range of motion;Decreased activity tolerance;Decreased balance;Decreased mobility;Decreased knowledge of use of DME;Pain  Barriers to Discharge None  PT Therapy Diagnosis  Difficulty walking;Generalized weakness;Acute pain  PT Plan  PT Frequency Min 5X/week  PT Treatment/Interventions DME instruction;Gait training;Functional mobility training;Therapeutic activities;Therapeutic exercise;Patient/family education  PT Recommendation  Recommendations for Other Services (None)  Follow Up Recommendations Home health PT;Supervision/Assistance - 24 hour  Equipment Recommended None recommended by PT  Individuals Consulted  Consulted and Agree with Results and Recommendations Patient  Acute Rehab PT Goals  PT Goal Formulation With patient  Time For Goal Achievement 06/17/12  Potential to Achieve Goals Good  Pt will go Supine/Side to Sit with supervision  PT Goal: Supine/Side to Sit - Progress Goal set today  Pt will go Sit to Supine/Side with supervision  PT Goal: Sit to Supine/Side - Progress Goal set today  Pt will go Sit to Stand with min assist  PT Goal: Sit to Stand - Progress Goal set today  Pt will Transfer Bed to Chair/Chair to Bed with min assist  PT Transfer Goal: Bed to Chair/Chair to Bed - Progress Goal set today  Pt will Propel Wheelchair 51 - 150 feet;with supervision  PT Goal: Propel Wheelchair - Progress Goal set today  PT General Charges  $$ ACUTE PT VISIT 1 Procedure  PT Evaluation  $Initial PT Evaluation Tier I 1 Procedure  PT Treatments  $Therapeutic Activity 23-37 mins  Written Expression  Dominant Hand Right    [place], PT DPT [phone]

## 2012-06-10 NOTE — Progress Notes (Signed)
Occupational Therapy Treatment Patient Details Name: [name] MRN: [id number] DOB: [birth year] Today's Date: 06/10/2012 Time: 4098-1191 OT Time Calculation (min): 26 min  OT Assessment / Plan / Recommendation Comments on Treatment Session      Follow Up Recommendations  Home health OT;Supervision/Assistance - 24 hour (consider SNF if not progressing with therapy)    Barriers to Discharge       Equipment Recommendations  None recommended by OT    Recommendations for Other Services    Frequency Min 2X/week   Plan      Precautions / Restrictions Precautions Precautions: Fall Required Braces or Orthoses: Knee Immobilizer - Right Knee Immobilizer - Right: On at all times Restrictions Weight Bearing Restrictions: Yes RLE Weight Bearing: Non weight bearing   Pertinent Vitals/Pain No c/o of pain    ADL  Eating/Feeding: Simulated;Set up Where Assessed - Eating/Feeding: Chair Grooming: Performed;Wash/dry hands;Wash/dry face;Set up Where Assessed - Grooming: Supported sitting Upper Body Bathing: Performed;Chest;Right arm;Left arm;Abdomen;Set up Where Assessed - Upper Body Bathing: Supported sitting Lower Body Bathing: Performed;Maximal assistance Where Assessed - Lower Body Bathing: Supported sit to stand Upper Body Dressing: Performed;Minimal assistance Where Assessed - Upper Body Dressing: Supported sitting Toilet Transfer: Performed;+2 Total assistance Toilet Transfer: Patient Percentage: 50% Statistician Method: Surveyor, minerals: Raised toilet seat with arms (or 3-in-1 over toilet) Toileting - Clothing Manipulation and Hygiene: Performed;+2 Total assistance Toileting - Architect and Hygiene: Patient Percentage: 50% Where Assessed - Toileting Clothing Manipulation and Hygiene: Sit to stand from 3-in-1 or toilet Transfers/Ambulation Related to ADLs: pt insist on using a "box" for transfers. Pt educated that we do not have a box in  acute care but would be willing to find something that could function like the box at home. Pt provided max questioning cues and pt only describes it as a box. pt thinks the height of  said box is ~ 18 inches high and maybe 12 inches wide. Question accuracy of this information due to cognitive deficits noted during session. ADL Comments: pt states "i have a little drainage" was response to therapist introducing self and department as occupational therapy. Pt asked if she would like to take a bath. pt states yes. pt then begins pulling covers back and states "well you're going to have to hurry". Therapist then realizes that is wanting to void. Pt provided 3n1 at bedside which initially patient refused. Pt incontinent in bed. Pt assisted to bedside and insist that therapist pull down underwear. Pt educated that she is not wearing underwear. Pt provided bath while on 3n1 Pt educated x3 during session that acute care does not have a box like she states she uses at home. Pt unable to describe box so that therapist can simulate a similar surface. Pt insist on directing transfers and when allowed time to complete is unable to verbalize to therapist how to complete transfer safely. Pt requires max redirection to come complete safely. Pt demonstrates cognitive deficits.     OT Diagnosis: Generalized weakness  OT Problem List: Decreased activity tolerance;Decreased knowledge of use of DME or AE OT Treatment Interventions: Self-care/ADL training;Therapeutic activities;DME and/or AE instruction;Patient/family education   OT Goals Acute Rehab OT Goals OT Goal Formulation: With patient Time For Goal Achievement: 06/24/12 Potential to Achieve Goals: Good ADL Goals Pt Will Transfer to Toilet: with min assist;Stand pivot transfer;3-in-1 ADL Goal: Toilet Transfer - Progress: Goal set today Pt Will Perform Toileting - Clothing Manipulation: with min assist;Sitting on 3-in-1 or toilet ADL Goal:  Toileting - Clothing  Manipulation - Progress: Goal set today Miscellaneous OT Goals Miscellaneous OT Goal #1: Pt will complete bed mobility Supervision level as precursor to adls OT Goal: Miscellaneous Goal #1 - Progress: Goal set today Miscellaneous OT Goal #2: Pt will direct staff / caregivers in transfers mod i as precursor to basic transfer OT Goal: Miscellaneous Goal #2 - Progress: Goal set today  Visit Information  Last OT Received On: 06/10/12 Assistance Needed: +2 PT/OT Co-Evaluation/Treatment: Yes    Subjective Data  Subjective: "I can't get much reading done but I'll figure it out sweetie"- pt response because therapist was asking questions about pain and comfort s/p chair transfer and patient preferred to read newspaper Patient Stated Goal: to go home and use my box - pt has a box that is used for transfers however patient is unable to describe in detail, (height material width etc to allow therapist to simulate this box pt describes as the only way she can transfer)   Prior Functioning  Home Living Lives With: Spouse Available Help at Discharge: Personal care attendant Type of Home: House Home Access: Ramped entrance Home Layout: One level Bathroom Shower/Tub: Health visitor: Handicapped height Bathroom Accessibility: Yes How Accessible: Accessible via walker Home Adaptive Equipment: Bedside commode/3-in-1;Walker - rolling;Walker - four wheeled;Grab bars around toilet;Grab bars in shower Additional Comments: Pt has aide at home and describes "several people female and female" that can help. Pt did not provide specifics Prior Function Level of Independence: Independent with assistive device(s) Able to Take Stairs?: No Driving: No Vocation: Retired Comments: pt has w/c present in room with Rt Leg rest present. No left leg rest present at this time. Communication Communication: HOH Dominant Hand: Right    Cognition  Overall Cognitive Status: Impaired Area of Impairment:  Problem solving;Awareness of errors;Executive functioning Arousal/Alertness: Awake/alert Orientation Level: Appears intact for tasks assessed Behavior During Session: Agitated (Pt easily agitated if not exactly done the same as home) Awareness of Errors: Other (comment);Assistance required to identify errors made (Pt unable to recognize that pt did not have on underwear.) Awareness of Errors - Other Comments: Pt attempted to take off underwear during 3n1 transfer when pt did not have underwear on.  Pt also continued to stated "This is not the way I do it at home.  I sat my leg on a box to do the transfer to the commode."  However pt unable to describe the box at home and continues to repeat herself. Cognition - Other Comments: pt unaware of void bladder in bed and lack of underwear    Mobility  Shoulder Instructions Bed Mobility Bed Mobility: Supine to Sit;Sitting - Scoot to Edge of Bed Supine to Sit: 4: Min assist Sitting - Scoot to [place] of Bed: 4: Min assist Details for Bed Mobility Assistance: pt required (A) holding Rt LE during transfer Transfers Transfers: Sit to Stand;Stand to Sit Sit to Stand: 1: +2 Total assist;From bed;With upper extremity assist;From elevated surface Sit to Stand: Patient Percentage: 50% Stand to Sit: 1: +2 Total assist;With upper extremity assist;To chair/3-in-1 Stand to Sit: Patient Percentage: 50% Details for Transfer Assistance: pt is able to use bil UE to scoot back in chair with Rt LE supported. Pt positioned in chair with Rt LE elevated       Exercises      Balance Static Sitting Balance Static Sitting - Balance Support: Bilateral upper extremity supported;Feet unsupported Static Sitting - Level of Assistance: 3: Mod assist   End  of Session OT - End of Session Activity Tolerance: Patient tolerated treatment well Patient left: in chair;with call bell/phone within reach Nurse Communication: Mobility status;Precautions  GO     Lucile Shutters 06/10/2012, 11:37 AM Pager: [phone]

## 2012-06-10 NOTE — Progress Notes (Addendum)
06/10/12 1100  OT Visit Information  Last OT Received On 06/10/12  Assistance Needed +2  PT/OT Co-Evaluation/Treatment Yes  OT Time Calculation  OT Start Time 1035  OT Stop Time 1101  OT Time Calculation (min) 26 min  Subjective Data  Subjective "I can't get much reading done but I'll figure it out sweetie"- pt response because therapist was asking questions about pain and comfort s/p chair transfer and patient preferred to read newspaper  Patient Stated Goal to go home and use my box - pt has a box that is used for transfers however patient is unable to describe in detail, (height material width etc to allow therapist to simulate this box pt describes as the only way she can transfer)  Precautions  Precautions Fall  Required Braces or Orthoses Knee Immobilizer - Right  Knee Immobilizer - Right On at all times  Restrictions  Weight Bearing Restrictions Yes  RLE Weight Bearing NWB  Home Living  Lives With Spouse  Available Help at Discharge Personal care attendant  Type of Home House  Home Access Ramped entrance  Home Layout One level  Bathroom Shower/Tub Walk-in shower  Bathroom Toilet Handicapped height  Bathroom Accessibility Yes  How Accessible Accessible via walker  Home Adaptive Equipment Bedside commode/3-in-1;Walker - rolling;Walker - four wheeled;Grab bars around toilet;Grab bars in shower  Additional Comments Pt has aide at home and describes "several people female and female" that can help. Pt did not provide specifics  Prior Function  Level of Independence Independent with assistive device(s)  Able to Take Stairs? No  Driving No  Vocation Retired  Comments pt has w/c present in room with Rt Leg rest present. No left leg rest present at this time.  Communication  Communication HOH  ADL  Eating/Feeding Simulated;Set up  Where Assessed - Eating/Feeding Chair  Grooming Performed;Wash/dry hands;Wash/dry face;Set up  Where Assessed - Grooming Supported sitting  Upper  Body Bathing Performed;Chest;Right arm;Left arm;Abdomen;Set up  Where Assessed - Upper Body Bathing Supported sitting  Lower Body Bathing Performed;Maximal assistance  Where Assessed - Lower Body Bathing Supported sit to stand  Upper Body Dressing Performed;Minimal assistance  Where Assessed - Upper Body Dressing Supported sitting  Toilet Transfer Performed;+2 Total assistance  Toilet Transfer: Patient Percentage 50%  Toilet Transfer Method Stand pivot  Toilet Transfer Equipment Raised toilet seat with arms (or 3-in-1 over toilet)  Toileting - Architect and Hygiene Performed;+2 Total assistance  Toileting - Clothing Manipulation and Hygiene: Patient Percentage 50%  Where Assessed - Toileting Clothing Manipulation and Hygiene Sit to stand from 3-in-1 or toilet  Transfers/Ambulation Related to ADLs pt insist on using a "box" for transfers. Pt educated that we do not have a box in acute care but would be willing to find something that could function like the box at home. Pt provided max questioning cues and pt only describes it as a box. pt thinks the height of  said box is ~ 18 inches high and maybe 12 inches wide. Question accuracy of this information due to cognitive deficits noted during session.  ADL Comments pt states "i have a little drainage" was response to therapist introducing self and department as occupational therapy. Pt asked if she would like to take a bath. pt states yes. pt then begins pulling covers back and states "well you're going to have to hurry". Therapist then realizes that is wanting to void. Pt provided 3n1 at bedside which initially patient refused. Pt incontinent in bed. Pt assisted to bedside  and insist that therapist pull down underwear. Pt educated that she is not wearing underwear. Pt provided bath while on 3n1 Pt educated x3 during session that acute care does not have a box like she states she uses at home. Pt unable to describe box so that therapist can  simulate a similar surface. Pt insist on directing transfers and when allowed time to complete is unable to verbalize to therapist how to complete transfer safely. Pt requires max redirection to come complete safely. Pt demonstrates cognitive deficits.   Vision - History  Baseline Vision Wears glasses all the time  Patient Visual Report No change from baseline  Cognition  Overall Cognitive Status Impaired (Simultaneous filing. User may not have seen previous data.)  Area of Impairment Problem solving;Awareness of errors;Executive functioning  Arousal/Alertness Awake/alert (Simultaneous filing. User may not have seen previous data.)  Orientation Level Appears intact for tasks assessed (Simultaneous filing. User may not have seen previous data.)  Behavior During Session Agitated (Pt easily agitated if not exactly done the same as home Simultaneous filing. User may not have seen previous data.)  Awareness of Errors Other (comment);Assistance required to identify errors made (Pt unable to recognize that pt did not have on underwear.)  Awareness of Errors - Other Comments Pt attempted to take off underwear during 3n1 transfer when pt did not have underwear on.  Pt also continued to stated "This is not the way I do it at home.  I sat my leg on a box to do the transfer to the commode."  However pt unable to describe the box at home and continues to repeat herself.  Cognition - Other Comments pt unaware of void bladder in bed and lack of underwear  Right Upper Extremity Assessment  RUE ROM/Strength/Tone WFL for tasks assessed  Left Upper Extremity Assessment  LUE ROM/Strength/Tone WFL for tasks assessed  Right Lower Extremity Assessment  RLE ROM/Strength/Tone Unable to fully assess;Due to pain;Due to precautions  RLE ROM/Strength/Tone Deficits Moves ankle well. wearing KI.   Bed Mobility  Bed Mobility Supine to Sit;Sitting - Scoot to Edge of Bed  Supine to Sit 1: +2 Total assist (Simultaneous  filing. User may not have seen previous data.)  Sitting - Scoot to Edge of Bed 4: Min assist  Details for Bed Mobility Assistance pt required (A) holding Rt LE during transfer  Transfers  Transfers Sit to Stand;Stand to Sit  Sit to Stand 1: +2 Total assist;From bed;With upper extremity assist;From elevated surface  Sit to Stand: Patient Percentage 50%  Stand to Sit 1: +2 Total assist;With upper extremity assist;To chair/3-in-1  Stand to Sit: Patient Percentage 50%  Details for Transfer Assistance pt is able to use bil UE to scoot back in chair with Rt LE supported. Pt positioned in chair with Rt LE elevated  Static Sitting Balance  Static Sitting - Balance Support Bilateral upper extremity supported;Feet unsupported  Static Sitting - Level of Assistance 3: Mod assist  OT - End of Session  Activity Tolerance Patient tolerated treatment well  Patient left in chair;with call bell/phone within reach  Nurse Communication Mobility status;Precautions  OT Assessment  Clinical Impression Statement 78 yo female admitted with ORIF Tibial plateau that could benefit from skilled OT acutely. Pt is high fall risk. Pt will requried at this time total+2 (A) for transfers. Recommend HHOT with 24/7 (A) per patient request.  OT Recommendation/Assessment Patient needs continued OT Services  OT Problem List Decreased activity tolerance;Decreased knowledge of use of DME or  AE  OT Therapy Diagnosis  Generalized weakness  OT Plan  OT Frequency Min 2X/week  OT Treatment/Interventions Self-care/ADL training;Therapeutic activities;DME and/or AE instruction;Patient/family education  OT Recommendation  Follow Up Recommendations Home health OT;Supervision/Assistance - 24 hour (consider SNF if not progressing with therapy)  Equipment Recommended None recommended by OT  Individuals Consulted  Consulted and Agree with Results and Recommendations Patient  Acute Rehab OT Goals  OT Goal Formulation With patient  Time  For Goal Achievement 06/24/12  Potential to Achieve Goals Good  ADL Goals  Pt Will Transfer to Toilet with min assist;Stand pivot transfer;3-in-1  ADL Goal: Toilet Transfer - Progress Goal set today  Pt Will Perform Toileting - Clothing Manipulation with min assist;Sitting on 3-in-1 or toilet  ADL Goal: Toileting - Clothing Manipulation - Progress Goal set today  Miscellaneous OT Goals  Miscellaneous OT Goal #1 Pt will complete bed mobility Supervision level as precursor to adls  OT Goal: Miscellaneous Goal #1 - Progress Goal set today  Miscellaneous OT Goal #2 Pt will direct staff / caregivers in transfers mod i as precursor to basic transfer  OT Goal: Miscellaneous Goal #2 - Progress Goal set today  OT General Charges  $OT Visit 1 Procedure  OT Evaluation  $Initial OT Evaluation Tier I 1 Procedure  OT Treatments  $Self Care/Home Management  8-22 mins  Written Expression  Dominant Hand Right      Lucile Shutters   OTR/L Pager: [phone] Office: [phone] .

## 2012-06-10 NOTE — Progress Notes (Signed)
     Subjective:  Patient reports pain as moderate.  Hydrocodone helped.  Refused PT yesterday.  Objective:   VITALS:   Filed Vitals:   06/09/12 0713 06/09/12 1400 06/09/12 2338 06/10/12 0639  BP: 125/63 133/51 118/39 120/46  Pulse: 92 94 92 90  Temp: 97.8 F (36.6 C) 99 F (37.2 C) 99.7 F (37.6 C) 98.6 F (37 C)  TempSrc:      Resp: 20 20 18 18   Height:      Weight:      SpO2: 100% 97% 96% 98%    Neurologically intact Sensation intact distally Incision: dressing C/D/I  LABS  No results found for this or any previous visit (from the past 24 hour(s)).  Dg Femur Right  06/08/2012  *RADIOLOGY REPORT*  Clinical Data: Right femoral fracture.  RIGHT FEMUR - 2 VIEW,DG C-ARM GT 120 MIN  Comparison: 05/18/2012  Findings: Remote rod and nail fixation of the proximal femur. Placement of a lateral fixation device with cerclage wires. Improved alignment of the periprosthetic distal femoral fracture. Distal screw fixation as well.  Degenerative changes about the knee, primarily medially.  IMPRESSION: Intraoperative imaging of plate and cerclage wire/screw fixation of periprosthetic distal femur fracture.   Original Report Authenticated By: Consuello Bossier, M.D.    Dg Femur Right Port  06/08/2012  *RADIOLOGY REPORT*  Clinical Data: Postop of distal femoral fixation.  PORTABLE RIGHT FEMUR - 2 VIEW  Comparison: Intraoperative imaging earlier in the evening.  Findings: Rod and proximal screw fixation of the femur.  Since 05/18/2012, interval placement of a plate and screws/ cerclage wire fixation device laterally.  This traverses the previously described distal femoral fracture.  No acute hardware complication.  Fracture alignment is improved. No acute fracture identified.  IMPRESSION: Interval placement of plate and screw/cerclage wire fixation across the previously described distal femur fracture.   Original Report Authenticated By: Consuello Bossier, M.D.    Dg C-arm Gt 120 Min  06/08/2012   *RADIOLOGY REPORT*  Clinical Data: Right femoral fracture.  RIGHT FEMUR - 2 VIEW,DG C-ARM GT 120 MIN  Comparison: 05/18/2012  Findings: Remote rod and nail fixation of the proximal femur. Placement of a lateral fixation device with cerclage wires. Improved alignment of the periprosthetic distal femoral fracture. Distal screw fixation as well.  Degenerative changes about the knee, primarily medially.  IMPRESSION: Intraoperative imaging of plate and cerclage wire/screw fixation of periprosthetic distal femur fracture.   Original Report Authenticated By: Consuello Bossier, M.D.     Assessment/Plan: 2 Days Post-Op   Principal Problem:  *Peri-prosthetic supracondylar fracture of femur   Advance diet Plan for discharge tomorrow Home per family's wishes.  Support in place. Dressing change tomorrow.   Dhamar Gregory P 06/10/2012, 7:20 AM   Teryl Lucy, MD Cell [phone] Pager [phone]

## 2012-06-11 LAB — TYPE AND SCREEN
Antibody Screen: NEGATIVE
Unit division: 0
Unit division: 0
Unit division: 0

## 2012-06-11 LAB — CBC
Hemoglobin: 8.6 g/dL — ABNORMAL LOW (ref 12.0–15.0)
MCH: 30.3 pg (ref 26.0–34.0)
MCV: 90.5 fL (ref 78.0–100.0)
RBC: 2.84 MIL/uL — ABNORMAL LOW (ref 3.87–5.11)

## 2012-06-11 LAB — PREPARE RBC (CROSSMATCH)

## 2012-06-11 NOTE — Discharge Summary (Signed)
Physician Discharge Summary  Patient ID: [name] MRN: [id number] DOB/AGE: 76/09/1919 76 y.o.  Admit date: 06/08/2012 Discharge date: 06/11/2012  Admission Diagnoses:  Peri-prosthetic supracondylar fracture of femur  Discharge Diagnoses:  Principal Problem:  *Peri-prosthetic supracondylar fracture of femur Active Problems:  Postoperative anemia due to acute blood loss Transfused 3 units PRBCs.  Past Medical History  Diagnosis Date  . Aortic valve regurgitation   . Hyperlipidemia   . Mitral valve disorder   . Pacemaker     For complete heart block, s/p generator change 09/2010   . Dyspnea   . Anxiety   . Depression   . Aneurysm of abdominal aorta     4.4 cm in 09/2010  . Closed intertrochanteric fracture of right hip 07/13/2011  . CHF (congestive heart failure)   . Left bundle branch block     dr Arlyn Leak  . Peri-prosthetic supracondylar fracture of femur 06/08/2012    Surgeries: Procedure(s): OPEN REDUCTION INTERNAL FIXATION (ORIF) TIBIAL PLATEAU on 06/08/2012 - 06/09/2012   Consultants (if any):    Discharged Condition: Improved  Hospital Course: [name] is an 79 y.o. female who was admitted 06/08/2012 with a diagnosis of Peri-prosthetic supracondylar fracture of femur and went to the operating room on 06/08/2012 - 06/09/2012 and underwent the above named procedures.    She was given perioperative antibiotics:  Anti-infectives     Start     Dose/Rate Route Frequency Ordered Stop   06/08/12 2345   ceFAZolin (ANCEF) IVPB 2 g/50 mL premix        2 g 100 mL/hr over 30 Minutes Intravenous Every 6 hours 06/08/12 2340 06/09/12 1144   06/07/12 1431   ceFAZolin (ANCEF) IVPB 2 g/50 mL premix        2 g 100 mL/hr over 30 Minutes Intravenous 60 min pre-op 06/07/12 1431 06/08/12 1728        .  She was given sequential compression devices, early ambulation, and xarelto and SCDs for DVT prophylaxis.  She benefited maximally from the hospital stay and  there were no complications.    Recent vital signs:  Filed Vitals:   06/11/12 0634  BP: 110/52  Pulse: 85  Temp: 98.6 F (37 C)  Resp: 18    Recent laboratory studies:  Lab Results  Component Value Date   HGB 8.6* 06/11/2012   HGB 7.2* 06/10/2012   HGB 9.9* 06/09/2012   Lab Results  Component Value Date   WBC 8.0 06/11/2012   PLT 185 06/11/2012   Lab Results  Component Value Date   INR 1.07 06/08/2012   Lab Results  Component Value Date   NA 132* 06/10/2012   K 4.0 06/10/2012   CL 100 06/10/2012   CO2 26 06/10/2012   BUN 24* 06/10/2012   CREATININE 0.83 06/10/2012   GLUCOSE 121* 06/10/2012    Discharge Medications:     Medication List     As of 06/11/2012  7:40 AM    TAKE these medications         acetaminophen 500 MG tablet   Commonly known as: TYLENOL   Take 2 tablets (1,000 mg total) by mouth every 6 (six) hours as needed. For arthritis pain      ACIDOPHILUS PO   Take 1 tablet by mouth daily.      aspirin 81 MG tablet   Take 81 mg by mouth daily.      cholecalciferol 1000 UNITS tablet   Commonly known as: VITAMIN D   Take  1,000 Units by mouth daily.      COREG 6.25 MG tablet   Generic drug: carvedilol   Take 6.25 mg by mouth 2 (two) times daily.      COZAAR 50 MG tablet   Generic drug: losartan   Take 25 mg by mouth daily.      furosemide 20 MG tablet   Commonly known as: LASIX   Take 20 mg by mouth 2 (two) times daily.      Glucosamine 500 MG Caps   Take 1,500 mg by mouth daily.      HYDROcodone-acetaminophen 5-325 MG per tablet   Commonly known as: NORCO/VICODIN   Take 1-2 tablets by mouth every 6 (six) hours as needed for pain. MAXIMUM TOTAL ACETAMINOPHEN DOSE IS 4000 MG PER DAY      Lutein 20 MG Caps   Take 20 mg by mouth daily.      MULTIVITAL tablet   Take 1 tablet by mouth daily.      oxybutynin 10 MG 24 hr tablet   Commonly known as: DITROPAN-XL   Take 10 mg by mouth every other day.      rivaroxaban 10 MG Tabs  tablet   Commonly known as: XARELTO   Take 1 tablet (10 mg total) by mouth daily.      rivaroxaban 10 MG Tabs tablet   Commonly known as: XARELTO   Take 10 mg by mouth daily.      sennosides-docusate sodium 8.6-50 MG tablet   Commonly known as: SENOKOT-S   Take 1 tablet by mouth daily.      simvastatin 20 MG tablet   Commonly known as: ZOCOR   Take 20 mg by mouth daily.        Diagnostic Studies: Dg Chest 2 View  05/18/2012  *RADIOLOGY REPORT*  Clinical Data: 78 year old female surgical clearance.  CHF.  CHEST - 2 VIEW  Comparison: 11/12/2011 and earlier.  Findings: Upright AP and lateral views of the chest.  Chronic cardiomegaly, but less pronounced than on prior, may reflect interval resolution of a pericardial effusion.  Stable left chest three lead cardiac pacemaker.  Chronically large lung volumes.  No pneumothorax, pulmonary edema, pleural effusion or consolidation. Osteopenia.  Advanced degenerative changes in the spine.  Calcified atherosclerosis of the aorta.  IMPRESSION: No acute cardiopulmonary abnormality.   Original Report Authenticated By: Harley Hallmark, M.D.    Dg Pelvis 1-2 Views  05/18/2012  *RADIOLOGY REPORT*  Clinical Data: Fall.  Evaluate right hip fracture healing.  PELVIS - 1-2 VIEW  Comparison: 08/13/2011.  Findings: The right intertrochanteric hip fracture appears healed status post antegrade femoral nail and gamma nail fixation. Diffuse osteopenia.  Obturator rings appear intact. Atherosclerosis.  IMPRESSION: Healed intertrochanteric right femur fracture status post gamma nail fixation.   Original Report Authenticated By: Andreas Newport, M.D.    Dg Femur Right  06/08/2012  *RADIOLOGY REPORT*  Clinical Data: Right femoral fracture.  RIGHT FEMUR - 2 VIEW,DG C-ARM GT 120 MIN  Comparison: 05/18/2012  Findings: Remote rod and nail fixation of the proximal femur. Placement of a lateral fixation device with cerclage wires. Improved alignment of the periprosthetic distal  femoral fracture. Distal screw fixation as well.  Degenerative changes about the knee, primarily medially.  IMPRESSION: Intraoperative imaging of plate and cerclage wire/screw fixation of periprosthetic distal femur fracture.   Original Report Authenticated By: Consuello Bossier, M.D.    Dg Femur Right  05/18/2012  *RADIOLOGY REPORT*  Clinical Data: Right femur fracture.  RIGHT FEMUR - 2 VIEW  Comparison: 05/18/2012.  Findings: Antegrade right femoral nail with gamma nail fixation of the right hip.  There is an oblique fracture of the distal right femoral diaphysis extending into the metaphysis.  This is a periprosthetic fracture. Distal interlocking screw in the right femoral nail follows the lateral fracture fragment.  There appears to be a butterfly fragment in the medial metadiaphysis.  Knee osteoarthritis is incidentally noted.  There is mild medial displacement of the distal femur relative to the proximal femoral shaft.  Diffuse osteopenia.  On the lateral view, there is mild apex anterior angulation and one cortex width anterior displacement of the distal femur.  Atherosclerosis is present.  IMPRESSION: Periprosthetic fracture of the distal femoral metadiaphysis. Probable medial butterfly fragment.  Proximal intertrochanteric fracture appears healed.   Original Report Authenticated By: Andreas Newport, M.D.    Dg Femur Right Port  06/08/2012  *RADIOLOGY REPORT*  Clinical Data: Postop of distal femoral fixation.  PORTABLE RIGHT FEMUR - 2 VIEW  Comparison: Intraoperative imaging earlier in the evening.  Findings: Rod and proximal screw fixation of the femur.  Since 05/18/2012, interval placement of a plate and screws/ cerclage wire fixation device laterally.  This traverses the previously described distal femoral fracture.  No acute hardware complication.  Fracture alignment is improved. No acute fracture identified.  IMPRESSION: Interval placement of plate and screw/cerclage wire fixation across the  previously described distal femur fracture.   Original Report Authenticated By: Consuello Bossier, M.D.    Dg Knee Complete 4 Views Right  05/18/2012  *RADIOLOGY REPORT*  Clinical Data: 78 year old female status post fall with pain.  RIGHT KNEE - COMPLETE 4+ VIEW  Comparison: Intraoperative images 07/14/2011.  Findings: Spiral comminuted fracture of the distal right femur. The proximal aspect of the fracture may not be entirely included on these images.  The fracture terminates at the level of the distal intramedullary rod.  The fracture plane does appear to extend into the patellofemoral joint space, the other knee joint spaces appear spared.  Medial angulation.  Proximal tibia and fibula appear intact.  Calcified atherosclerosis.  IMPRESSION: Spiral, comminuted fracture of the distal right femoral shaft and metadiaphysis about the intramedullary rod.  Fracture probably does extend into the patellofemoral joint.  Medial angulation of the distal fragment.   Original Report Authenticated By: Harley Hallmark, M.D.    Dg C-arm Gt 120 Min  06/08/2012  *RADIOLOGY REPORT*  Clinical Data: Right femoral fracture.  RIGHT FEMUR - 2 VIEW,DG C-ARM GT 120 MIN  Comparison: 05/18/2012  Findings: Remote rod and nail fixation of the proximal femur. Placement of a lateral fixation device with cerclage wires. Improved alignment of the periprosthetic distal femoral fracture. Distal screw fixation as well.  Degenerative changes about the knee, primarily medially.  IMPRESSION: Intraoperative imaging of plate and cerclage wire/screw fixation of periprosthetic distal femur fracture.   Original Report Authenticated By: Consuello Bossier, M.D.     Disposition: 06-Home-Health Care Svc      Discharge Orders    Future Appointments: Provider: Department: Dept Phone: Center:   02/08/2013 9:00 AM [facility] [phone] [facility]   02/08/2013 10:00 AM Larina Earthly, MD [facility] [phone] [facility]     Future Orders Please  Complete By Expires   Diet general      Call MD / Call 911      Comments:   If you experience chest pain or shortness of breath, CALL 911 and be transported to  the hospital emergency room.  If you develope a fever above 101 F, pus (white drainage) or increased drainage or redness at the wound, or calf pain, call your surgeon's office.   Discharge instructions      Comments:   Change dressing in 3 days and reapply fresh dressing, unless you have a splint (half cast).  If you have a splint/cast, just leave in place until your follow-up appointment.    Keep wounds dry for 3 weeks.  Leave steri-strips in place on skin.  Do not apply lotion or anything to the wound.   Constipation Prevention      Comments:   Drink plenty of fluids.  Prune juice may be helpful.  You may use a stool softener, such as Colace (over the counter) 100 mg twice a day.  Use MiraLax (over the counter) for constipation as needed.   Non weight bearing      Scheduling Instructions:   Right lower extremity      Follow-up Information    Follow up with Seirra Kos P, MD. In 2 weeks.   Contact information:   [facility] [address] [place] [address] [phone]           Signed: Eulas Post 06/11/2012, 7:40 AM

## 2012-06-11 NOTE — Progress Notes (Signed)
Pt discharged home via [facility]. All discharge paperwork was gone over and all questions/concerns were addressed.

## 2012-06-11 NOTE — Progress Notes (Signed)
Physical Therapy Treatment Patient Details Name: [name] MRN: [id number] DOB: December 08, 1919 Today's Date: 06/11/2012 Time: 4696-2952 PT Time Calculation (min): 54 min  PT Assessment / Plan / Recommendation Comments on Treatment Session  L distal femur fx distal to old IM nail.  Pt choosing non surgical approach.  KI all times and NWB.  Pt requires Total+2 (A) and CM present for Home care states that total+2  is available for home. .    Follow Up Recommendations  Home health PT;Supervision/Assistance - 24 hour     Does the patient have the potential to tolerate intense rehabilitation     Barriers to Discharge        Equipment Recommendations  None recommended by OT    Recommendations for Other Services    Frequency Min 5X/week   Plan      Precautions / Restrictions Precautions Precautions: Fall Required Braces or Orthoses: Knee Immobilizer - Right Knee Immobilizer - Right: On at all times Restrictions Weight Bearing Restrictions: Yes RLE Weight Bearing: Non weight bearing   Pertinent Vitals/Pain Pain evident but no numerical value given     Mobility  Bed Mobility Bed Mobility: Supine to Sit;Sitting - Scoot to Edge of Bed Supine to Sit: 4: Min assist Sitting - Scoot to [place] of Bed: 4: Min assist Details for Bed Mobility Assistance: pt required (A) holding Rt LE during transfer Transfers Sit to Stand: 1: +2 Total assist;From bed;With upper extremity assist;From elevated surface Sit to Stand: Patient Percentage: 50% Stand to Sit: 1: +2 Total assist;With upper extremity assist;To chair/3-in-1 Stand to Sit: Patient Percentage: 50% Stand Pivot Transfers: 3: Mod assist;4: Min assist Stand Pivot Transfers: Patient Percentage: 60% Details for Transfer Assistance: Performed x2; assist for stabilization of 3in1 during transfer while pt req assist; to chair mod assis for body transfer Ambulation/Gait Ambulation/Gait Assistance: Not tested (comment)    Exercises     PT  Diagnosis:    PT Problem List:   PT Treatment Interventions:     PT Goals Acute Rehab PT Goals PT Goal: Supine/Side to Sit - Progress: Progressing toward goal  Visit Information  Last PT Received On: 06/11/12 Assistance Needed: +2    Subjective Data  Subjective: "I would love to have my hearing aide in" Patient Stated Goal: Home   Cognition  Overall Cognitive Status: Impaired Area of Impairment: Problem solving;Awareness of errors;Executive functioning Arousal/Alertness: Awake/alert Orientation Level: Appears intact for tasks assessed Behavior During Session:  (due to cognitive deficits pt per sameness to routine) Cognition - Other Comments: pt very aware of incontinence this session. Pt responds well to education on the reason for needed mobility and will progress.    Balance     End of Session PT - End of Session Equipment Utilized During Treatment: Other (comment) (Wedge for RLE support with transfers) Activity Tolerance: Patient tolerated treatment well Patient left: in chair;with call bell/phone within reach;with family/visitor present   GP     Fabio Asa 06/11/2012, 11:15 AM Charlotte Crumb, PT DPT  [phone]

## 2012-06-11 NOTE — Progress Notes (Signed)
Per MD- patient is stable for d/c today. Patient and daughter requesting EMS transport home- arranged with [facility]. Pt's nurse- Carollee Herter notified of EMS arrangments.  No further SW needs identified.  Lorri Frederick. [place]  [phone]

## 2012-06-11 NOTE — Progress Notes (Signed)
Occupational Therapy Treatment Patient Details Name: [name] MRN: [id number] DOB: [birth year] Today's Date: 06/11/2012 Time: 4098-1191 OT Time Calculation (min): 55 min  OT Assessment / Plan / Recommendation Comments on Treatment Session Pt requires Total+2 (A) and CM present for Home care states that total+2  is available for home. Pt demonstrates cognitive deficits and CM reports deficits are progressing.     Follow Up Recommendations  Home health OT;Supervision/Assistance - 24 hour    Barriers to Discharge       Equipment Recommendations  None recommended by OT    Recommendations for Other Services    Frequency Min 2X/week   Plan Discharge plan remains appropriate    Precautions / Restrictions Precautions Precautions: Fall Required Braces or Orthoses: Knee Immobilizer - Right Knee Immobilizer - Right: On at all times Restrictions Weight Bearing Restrictions: Yes RLE Weight Bearing: Non weight bearing   Pertinent Vitals/Pain No pain reported sitting supported Reports minimal discomfort with transfers Pt states "thank you girls you did well"    ADL  Grooming: Performed;Wash/dry face;Wash/dry hands;Set up Where Assessed - Grooming: Supported sitting Upper Body Bathing: Performed;Chest;Right arm;Left arm;Abdomen;Set up Where Assessed - Upper Body Bathing: Supported sitting Lower Body Bathing: Performed;Maximal assistance Where Assessed - Lower Body Bathing: Supported sitting (sliding forward on 3n1 for therapist to (A)) Upper Body Dressing: Performed;Minimal assistance Where Assessed - Upper Body Dressing: Supported sitting Toilet Transfer: Performed;+2 Total assistance Toilet Transfer: Patient Percentage: 50% Statistician Method: Surveyor, minerals: Raised toilet seat with arms (or 3-in-1 over toilet) Toileting - Clothing Manipulation and Hygiene: Performed;+2 Total assistance Toileting - Architect and Hygiene: Patient  Percentage: 30% Where Assessed - Toileting Clothing Manipulation and Hygiene: Sit to stand from 3-in-1 or toilet Equipment Used: Other (comment) (wedge for Rt LE support) Transfers/Ambulation Related to ADLs: Pt completing basic transfer requires total+2 (A). Pt's case manager for home present and assures therapist that she has total+2 (A) at home and that patient is doing transfers at a lower level than PTA. CM states "she is weaker than she remembers she did much better before"  ADL Comments: Pt states upon entry "i am wet and have been wet all night" Pt found to have bed linens saturated with urine and multipe rings of color on linens. Therapist reassured patient that she would be changed immediately during the session. CM for home health was present and was not happy to find patient in this condition this am. Pt had a personal paid aide in the room during night hours in addition to [facility] staff. CM reports "i was told this morning that she was bathed last night and this does not appear to be truthful". Therapist provided patient with a full bath. Pt found to have redness and early signs of skin break down in peri area. Lotion and barrier cream applied . Pt provided education on bladder program every 2 hours with staff due to incontience and if frequency of incontinence decreases then Q4 hours would be appropriate. Cm in room to help with education and reports patient husband with recent ulcer due to incontinence as well. Rn Runner, broadcasting/film/video and tech all informed of bladder program need, peri area break down, course of therapy treatment and requesting new KI due to incontinence on brace.    OT Diagnosis:    OT Problem List:   OT Treatment Interventions:     OT Goals Acute Rehab OT Goals OT Goal Formulation: With patient Time For Goal Achievement: 06/24/12 Potential to Achieve  Goals: Good ADL Goals Pt Will Transfer to Toilet: with min assist;Stand pivot transfer;3-in-1 ADL Goal: Toilet Transfer - Progress:  Progressing toward goals Pt Will Perform Toileting - Clothing Manipulation: with min assist;Sitting on 3-in-1 or toilet ADL Goal: Toileting - Clothing Manipulation - Progress: Progressing toward goals Miscellaneous OT Goals Miscellaneous OT Goal #1: Pt will complete bed mobility Supervision level as precursor to adls OT Goal: Miscellaneous Goal #1 - Progress: Progressing toward goals Miscellaneous OT Goal #2: Pt will direct staff / caregivers in transfers mod i as precursor to basic transfer OT Goal: Miscellaneous Goal #2 - Progress: Progressing toward goals  Visit Information  Last OT Received On: 06/11/12 Assistance Needed: +2 PT/OT Co-Evaluation/Treatment: Yes    Subjective Data      Prior Functioning       Cognition  Overall Cognitive Status: Impaired Area of Impairment: Problem solving;Awareness of errors;Executive functioning Arousal/Alertness: Awake/alert Orientation Level: Appears intact for tasks assessed Behavior During Session:  (due to cognitive deficits pt per sameness to routine) Cognition - Other Comments: pt very aware of incontinence this session. Pt responds well to education on the reason for needed mobility and will progress.    Mobility  Shoulder Instructions Bed Mobility Bed Mobility: Supine to Sit;Sitting - Scoot to Edge of Bed Supine to Sit: 4: Min assist Sitting - Scoot to [place] of Bed: 4: Min assist Details for Bed Mobility Assistance: pt required (A) holding Rt LE during transfer Transfers Sit to Stand: 1: +2 Total assist;From bed;With upper extremity assist;From elevated surface Sit to Stand: Patient Percentage: 50% Stand to Sit: 1: +2 Total assist;With upper extremity assist;To chair/3-in-1 Stand to Sit: Patient Percentage: 50% Details for Transfer Assistance: Performed x2; assist for stabilization of 3in1 during transfer while pt req assist; to chair mod assis for body transfer       Exercises      Balance     End of Session OT - End of  Session Activity Tolerance: Patient tolerated treatment well Patient left: in chair;with call bell/phone within reach Nurse Communication: Mobility status;Precautions  GO     Lucile Shutters 06/11/2012, 10:56 AM Pager: [phone]

## 2012-06-11 NOTE — Progress Notes (Signed)
Orthopedic Tech Progress Note Patient Details:  [name] 1920-03-21 [id number] Replacement knee immobilizer order for patient. First immobilizer was soiled. Knee immobilizer applied to Right LE, patient tolerated fairly. Ortho Devices Type of Ortho Device: Knee Immobilizer Ortho Device/Splint Location: Right LE Ortho Device/Splint Interventions: Application   Asia R Thompson 06/11/2012, 11:27 AM

## 2012-06-12 DIAGNOSIS — I5022 Chronic systolic (congestive) heart failure: Secondary | ICD-10-CM | POA: Diagnosis not present

## 2012-06-12 DIAGNOSIS — IMO0001 Reserved for inherently not codable concepts without codable children: Secondary | ICD-10-CM | POA: Diagnosis not present

## 2012-06-12 DIAGNOSIS — D62 Acute posthemorrhagic anemia: Secondary | ICD-10-CM | POA: Diagnosis not present

## 2012-06-12 DIAGNOSIS — F329 Major depressive disorder, single episode, unspecified: Secondary | ICD-10-CM | POA: Diagnosis not present

## 2012-06-12 DIAGNOSIS — S7290XD Unspecified fracture of unspecified femur, subsequent encounter for closed fracture with routine healing: Secondary | ICD-10-CM | POA: Diagnosis not present

## 2012-06-14 ENCOUNTER — Encounter (HOSPITAL_COMMUNITY): Payer: Self-pay | Admitting: Orthopedic Surgery

## 2012-06-14 DIAGNOSIS — IMO0001 Reserved for inherently not codable concepts without codable children: Secondary | ICD-10-CM | POA: Diagnosis not present

## 2012-06-14 DIAGNOSIS — S7290XD Unspecified fracture of unspecified femur, subsequent encounter for closed fracture with routine healing: Secondary | ICD-10-CM | POA: Diagnosis not present

## 2012-06-14 DIAGNOSIS — D62 Acute posthemorrhagic anemia: Secondary | ICD-10-CM | POA: Diagnosis not present

## 2012-06-14 DIAGNOSIS — I5022 Chronic systolic (congestive) heart failure: Secondary | ICD-10-CM | POA: Diagnosis not present

## 2012-06-14 DIAGNOSIS — F329 Major depressive disorder, single episode, unspecified: Secondary | ICD-10-CM | POA: Diagnosis not present

## 2012-06-14 LAB — TYPE AND SCREEN: Unit division: 0

## 2012-06-17 ENCOUNTER — Other Ambulatory Visit: Payer: Self-pay | Admitting: Orthopedic Surgery

## 2012-06-21 DIAGNOSIS — F329 Major depressive disorder, single episode, unspecified: Secondary | ICD-10-CM | POA: Diagnosis not present

## 2012-06-21 DIAGNOSIS — I5022 Chronic systolic (congestive) heart failure: Secondary | ICD-10-CM | POA: Diagnosis not present

## 2012-06-21 DIAGNOSIS — IMO0001 Reserved for inherently not codable concepts without codable children: Secondary | ICD-10-CM | POA: Diagnosis not present

## 2012-06-21 DIAGNOSIS — S7290XD Unspecified fracture of unspecified femur, subsequent encounter for closed fracture with routine healing: Secondary | ICD-10-CM | POA: Diagnosis not present

## 2012-06-21 DIAGNOSIS — D62 Acute posthemorrhagic anemia: Secondary | ICD-10-CM | POA: Diagnosis not present

## 2012-06-23 DIAGNOSIS — S7290XD Unspecified fracture of unspecified femur, subsequent encounter for closed fracture with routine healing: Secondary | ICD-10-CM | POA: Diagnosis not present

## 2012-06-23 DIAGNOSIS — D62 Acute posthemorrhagic anemia: Secondary | ICD-10-CM | POA: Diagnosis not present

## 2012-06-23 DIAGNOSIS — I5022 Chronic systolic (congestive) heart failure: Secondary | ICD-10-CM | POA: Diagnosis not present

## 2012-06-23 DIAGNOSIS — IMO0001 Reserved for inherently not codable concepts without codable children: Secondary | ICD-10-CM | POA: Diagnosis not present

## 2012-06-23 DIAGNOSIS — F329 Major depressive disorder, single episode, unspecified: Secondary | ICD-10-CM | POA: Diagnosis not present

## 2012-06-24 DIAGNOSIS — F329 Major depressive disorder, single episode, unspecified: Secondary | ICD-10-CM | POA: Diagnosis not present

## 2012-06-24 DIAGNOSIS — I5022 Chronic systolic (congestive) heart failure: Secondary | ICD-10-CM | POA: Diagnosis not present

## 2012-06-24 DIAGNOSIS — S7290XD Unspecified fracture of unspecified femur, subsequent encounter for closed fracture with routine healing: Secondary | ICD-10-CM | POA: Diagnosis not present

## 2012-06-24 DIAGNOSIS — D62 Acute posthemorrhagic anemia: Secondary | ICD-10-CM | POA: Diagnosis not present

## 2012-06-24 DIAGNOSIS — IMO0001 Reserved for inherently not codable concepts without codable children: Secondary | ICD-10-CM | POA: Diagnosis not present

## 2012-06-28 DIAGNOSIS — F329 Major depressive disorder, single episode, unspecified: Secondary | ICD-10-CM | POA: Diagnosis not present

## 2012-06-28 DIAGNOSIS — I5022 Chronic systolic (congestive) heart failure: Secondary | ICD-10-CM | POA: Diagnosis not present

## 2012-06-28 DIAGNOSIS — S7290XD Unspecified fracture of unspecified femur, subsequent encounter for closed fracture with routine healing: Secondary | ICD-10-CM | POA: Diagnosis not present

## 2012-06-28 DIAGNOSIS — D62 Acute posthemorrhagic anemia: Secondary | ICD-10-CM | POA: Diagnosis not present

## 2012-06-28 DIAGNOSIS — S72009D Fracture of unspecified part of neck of unspecified femur, subsequent encounter for closed fracture with routine healing: Secondary | ICD-10-CM | POA: Diagnosis not present

## 2012-06-28 DIAGNOSIS — IMO0001 Reserved for inherently not codable concepts without codable children: Secondary | ICD-10-CM | POA: Diagnosis not present

## 2012-06-29 DIAGNOSIS — S7290XA Unspecified fracture of unspecified femur, initial encounter for closed fracture: Secondary | ICD-10-CM | POA: Diagnosis not present

## 2012-06-29 DIAGNOSIS — I10 Essential (primary) hypertension: Secondary | ICD-10-CM | POA: Diagnosis not present

## 2012-06-29 DIAGNOSIS — D649 Anemia, unspecified: Secondary | ICD-10-CM | POA: Diagnosis not present

## 2012-06-29 DIAGNOSIS — Z23 Encounter for immunization: Secondary | ICD-10-CM | POA: Diagnosis not present

## 2012-07-02 DIAGNOSIS — M79609 Pain in unspecified limb: Secondary | ICD-10-CM | POA: Diagnosis not present

## 2012-07-02 DIAGNOSIS — B351 Tinea unguium: Secondary | ICD-10-CM | POA: Diagnosis not present

## 2012-07-13 DIAGNOSIS — I5022 Chronic systolic (congestive) heart failure: Secondary | ICD-10-CM | POA: Diagnosis not present

## 2012-07-13 DIAGNOSIS — F329 Major depressive disorder, single episode, unspecified: Secondary | ICD-10-CM | POA: Diagnosis not present

## 2012-07-13 DIAGNOSIS — S7290XD Unspecified fracture of unspecified femur, subsequent encounter for closed fracture with routine healing: Secondary | ICD-10-CM | POA: Diagnosis not present

## 2012-07-13 DIAGNOSIS — IMO0001 Reserved for inherently not codable concepts without codable children: Secondary | ICD-10-CM | POA: Diagnosis not present

## 2012-07-13 DIAGNOSIS — D62 Acute posthemorrhagic anemia: Secondary | ICD-10-CM | POA: Diagnosis not present

## 2012-07-14 DIAGNOSIS — IMO0001 Reserved for inherently not codable concepts without codable children: Secondary | ICD-10-CM | POA: Diagnosis not present

## 2012-07-14 DIAGNOSIS — D62 Acute posthemorrhagic anemia: Secondary | ICD-10-CM | POA: Diagnosis not present

## 2012-07-14 DIAGNOSIS — S7290XD Unspecified fracture of unspecified femur, subsequent encounter for closed fracture with routine healing: Secondary | ICD-10-CM | POA: Diagnosis not present

## 2012-07-14 DIAGNOSIS — I5022 Chronic systolic (congestive) heart failure: Secondary | ICD-10-CM | POA: Diagnosis not present

## 2012-07-14 DIAGNOSIS — F329 Major depressive disorder, single episode, unspecified: Secondary | ICD-10-CM | POA: Diagnosis not present

## 2012-07-15 DIAGNOSIS — IMO0001 Reserved for inherently not codable concepts without codable children: Secondary | ICD-10-CM | POA: Diagnosis not present

## 2012-07-15 DIAGNOSIS — D62 Acute posthemorrhagic anemia: Secondary | ICD-10-CM | POA: Diagnosis not present

## 2012-07-15 DIAGNOSIS — I5022 Chronic systolic (congestive) heart failure: Secondary | ICD-10-CM | POA: Diagnosis not present

## 2012-07-15 DIAGNOSIS — F329 Major depressive disorder, single episode, unspecified: Secondary | ICD-10-CM | POA: Diagnosis not present

## 2012-07-15 DIAGNOSIS — S7290XD Unspecified fracture of unspecified femur, subsequent encounter for closed fracture with routine healing: Secondary | ICD-10-CM | POA: Diagnosis not present

## 2012-07-19 DIAGNOSIS — I5022 Chronic systolic (congestive) heart failure: Secondary | ICD-10-CM | POA: Diagnosis not present

## 2012-07-19 DIAGNOSIS — D62 Acute posthemorrhagic anemia: Secondary | ICD-10-CM | POA: Diagnosis not present

## 2012-07-19 DIAGNOSIS — F329 Major depressive disorder, single episode, unspecified: Secondary | ICD-10-CM | POA: Diagnosis not present

## 2012-07-19 DIAGNOSIS — S7290XD Unspecified fracture of unspecified femur, subsequent encounter for closed fracture with routine healing: Secondary | ICD-10-CM | POA: Diagnosis not present

## 2012-07-19 DIAGNOSIS — IMO0001 Reserved for inherently not codable concepts without codable children: Secondary | ICD-10-CM | POA: Diagnosis not present

## 2012-07-21 DIAGNOSIS — D62 Acute posthemorrhagic anemia: Secondary | ICD-10-CM | POA: Diagnosis not present

## 2012-07-21 DIAGNOSIS — I5022 Chronic systolic (congestive) heart failure: Secondary | ICD-10-CM | POA: Diagnosis not present

## 2012-07-21 DIAGNOSIS — F329 Major depressive disorder, single episode, unspecified: Secondary | ICD-10-CM | POA: Diagnosis not present

## 2012-07-21 DIAGNOSIS — IMO0001 Reserved for inherently not codable concepts without codable children: Secondary | ICD-10-CM | POA: Diagnosis not present

## 2012-07-21 DIAGNOSIS — S7290XD Unspecified fracture of unspecified femur, subsequent encounter for closed fracture with routine healing: Secondary | ICD-10-CM | POA: Diagnosis not present

## 2012-07-22 DIAGNOSIS — IMO0001 Reserved for inherently not codable concepts without codable children: Secondary | ICD-10-CM | POA: Diagnosis not present

## 2012-07-22 DIAGNOSIS — I5022 Chronic systolic (congestive) heart failure: Secondary | ICD-10-CM | POA: Diagnosis not present

## 2012-07-22 DIAGNOSIS — Z7901 Long term (current) use of anticoagulants: Secondary | ICD-10-CM | POA: Diagnosis not present

## 2012-07-22 DIAGNOSIS — F329 Major depressive disorder, single episode, unspecified: Secondary | ICD-10-CM | POA: Diagnosis not present

## 2012-07-22 DIAGNOSIS — S7290XD Unspecified fracture of unspecified femur, subsequent encounter for closed fracture with routine healing: Secondary | ICD-10-CM | POA: Diagnosis not present

## 2012-07-23 DIAGNOSIS — Z7901 Long term (current) use of anticoagulants: Secondary | ICD-10-CM | POA: Diagnosis not present

## 2012-07-23 DIAGNOSIS — IMO0001 Reserved for inherently not codable concepts without codable children: Secondary | ICD-10-CM | POA: Diagnosis not present

## 2012-07-23 DIAGNOSIS — S7290XD Unspecified fracture of unspecified femur, subsequent encounter for closed fracture with routine healing: Secondary | ICD-10-CM | POA: Diagnosis not present

## 2012-07-23 DIAGNOSIS — I5022 Chronic systolic (congestive) heart failure: Secondary | ICD-10-CM | POA: Diagnosis not present

## 2012-07-23 DIAGNOSIS — F329 Major depressive disorder, single episode, unspecified: Secondary | ICD-10-CM | POA: Diagnosis not present

## 2012-07-26 DIAGNOSIS — IMO0001 Reserved for inherently not codable concepts without codable children: Secondary | ICD-10-CM | POA: Diagnosis not present

## 2012-07-26 DIAGNOSIS — S7290XD Unspecified fracture of unspecified femur, subsequent encounter for closed fracture with routine healing: Secondary | ICD-10-CM | POA: Diagnosis not present

## 2012-07-26 DIAGNOSIS — I5022 Chronic systolic (congestive) heart failure: Secondary | ICD-10-CM | POA: Diagnosis not present

## 2012-07-26 DIAGNOSIS — F329 Major depressive disorder, single episode, unspecified: Secondary | ICD-10-CM | POA: Diagnosis not present

## 2012-07-26 DIAGNOSIS — Z4789 Encounter for other orthopedic aftercare: Secondary | ICD-10-CM | POA: Diagnosis not present

## 2012-07-26 DIAGNOSIS — Z7901 Long term (current) use of anticoagulants: Secondary | ICD-10-CM | POA: Diagnosis not present

## 2012-07-28 DIAGNOSIS — I5022 Chronic systolic (congestive) heart failure: Secondary | ICD-10-CM | POA: Diagnosis not present

## 2012-07-28 DIAGNOSIS — S7290XD Unspecified fracture of unspecified femur, subsequent encounter for closed fracture with routine healing: Secondary | ICD-10-CM | POA: Diagnosis not present

## 2012-07-28 DIAGNOSIS — IMO0001 Reserved for inherently not codable concepts without codable children: Secondary | ICD-10-CM | POA: Diagnosis not present

## 2012-07-28 DIAGNOSIS — F329 Major depressive disorder, single episode, unspecified: Secondary | ICD-10-CM | POA: Diagnosis not present

## 2012-07-28 DIAGNOSIS — Z7901 Long term (current) use of anticoagulants: Secondary | ICD-10-CM | POA: Diagnosis not present

## 2012-07-30 DIAGNOSIS — S7290XD Unspecified fracture of unspecified femur, subsequent encounter for closed fracture with routine healing: Secondary | ICD-10-CM | POA: Diagnosis not present

## 2012-07-30 DIAGNOSIS — Z7901 Long term (current) use of anticoagulants: Secondary | ICD-10-CM | POA: Diagnosis not present

## 2012-07-30 DIAGNOSIS — F329 Major depressive disorder, single episode, unspecified: Secondary | ICD-10-CM | POA: Diagnosis not present

## 2012-07-30 DIAGNOSIS — IMO0001 Reserved for inherently not codable concepts without codable children: Secondary | ICD-10-CM | POA: Diagnosis not present

## 2012-07-30 DIAGNOSIS — I5022 Chronic systolic (congestive) heart failure: Secondary | ICD-10-CM | POA: Diagnosis not present

## 2012-08-02 DIAGNOSIS — IMO0001 Reserved for inherently not codable concepts without codable children: Secondary | ICD-10-CM | POA: Diagnosis not present

## 2012-08-02 DIAGNOSIS — I5022 Chronic systolic (congestive) heart failure: Secondary | ICD-10-CM | POA: Diagnosis not present

## 2012-08-02 DIAGNOSIS — Z7901 Long term (current) use of anticoagulants: Secondary | ICD-10-CM | POA: Diagnosis not present

## 2012-08-02 DIAGNOSIS — S7290XD Unspecified fracture of unspecified femur, subsequent encounter for closed fracture with routine healing: Secondary | ICD-10-CM | POA: Diagnosis not present

## 2012-08-02 DIAGNOSIS — F329 Major depressive disorder, single episode, unspecified: Secondary | ICD-10-CM | POA: Diagnosis not present

## 2012-08-03 DIAGNOSIS — K219 Gastro-esophageal reflux disease without esophagitis: Secondary | ICD-10-CM | POA: Diagnosis not present

## 2012-08-03 DIAGNOSIS — Z01810 Encounter for preprocedural cardiovascular examination: Secondary | ICD-10-CM | POA: Diagnosis not present

## 2012-08-03 DIAGNOSIS — I10 Essential (primary) hypertension: Secondary | ICD-10-CM | POA: Diagnosis not present

## 2012-08-03 DIAGNOSIS — R32 Unspecified urinary incontinence: Secondary | ICD-10-CM | POA: Diagnosis not present

## 2012-08-03 DIAGNOSIS — I359 Nonrheumatic aortic valve disorder, unspecified: Secondary | ICD-10-CM | POA: Diagnosis not present

## 2012-08-03 DIAGNOSIS — S7290XA Unspecified fracture of unspecified femur, initial encounter for closed fracture: Secondary | ICD-10-CM | POA: Diagnosis not present

## 2012-08-03 DIAGNOSIS — E782 Mixed hyperlipidemia: Secondary | ICD-10-CM | POA: Diagnosis not present

## 2012-08-03 DIAGNOSIS — I447 Left bundle-branch block, unspecified: Secondary | ICD-10-CM | POA: Diagnosis not present

## 2012-08-03 DIAGNOSIS — I059 Rheumatic mitral valve disease, unspecified: Secondary | ICD-10-CM | POA: Diagnosis not present

## 2012-08-03 DIAGNOSIS — Z950 Presence of cardiac pacemaker: Secondary | ICD-10-CM | POA: Diagnosis not present

## 2012-08-03 DIAGNOSIS — I5022 Chronic systolic (congestive) heart failure: Secondary | ICD-10-CM | POA: Diagnosis not present

## 2012-08-03 DIAGNOSIS — I714 Abdominal aortic aneurysm, without rupture: Secondary | ICD-10-CM | POA: Diagnosis not present

## 2012-08-03 DIAGNOSIS — E785 Hyperlipidemia, unspecified: Secondary | ICD-10-CM | POA: Diagnosis not present

## 2012-08-03 DIAGNOSIS — I509 Heart failure, unspecified: Secondary | ICD-10-CM | POA: Diagnosis not present

## 2012-08-04 DIAGNOSIS — F329 Major depressive disorder, single episode, unspecified: Secondary | ICD-10-CM | POA: Diagnosis not present

## 2012-08-04 DIAGNOSIS — S7290XD Unspecified fracture of unspecified femur, subsequent encounter for closed fracture with routine healing: Secondary | ICD-10-CM | POA: Diagnosis not present

## 2012-08-04 DIAGNOSIS — I5022 Chronic systolic (congestive) heart failure: Secondary | ICD-10-CM | POA: Diagnosis not present

## 2012-08-04 DIAGNOSIS — IMO0001 Reserved for inherently not codable concepts without codable children: Secondary | ICD-10-CM | POA: Diagnosis not present

## 2012-08-04 DIAGNOSIS — Z7901 Long term (current) use of anticoagulants: Secondary | ICD-10-CM | POA: Diagnosis not present

## 2012-08-06 DIAGNOSIS — I5022 Chronic systolic (congestive) heart failure: Secondary | ICD-10-CM | POA: Diagnosis not present

## 2012-08-06 DIAGNOSIS — IMO0001 Reserved for inherently not codable concepts without codable children: Secondary | ICD-10-CM | POA: Diagnosis not present

## 2012-08-06 DIAGNOSIS — S7290XD Unspecified fracture of unspecified femur, subsequent encounter for closed fracture with routine healing: Secondary | ICD-10-CM | POA: Diagnosis not present

## 2012-08-06 DIAGNOSIS — F329 Major depressive disorder, single episode, unspecified: Secondary | ICD-10-CM | POA: Diagnosis not present

## 2012-08-06 DIAGNOSIS — Z7901 Long term (current) use of anticoagulants: Secondary | ICD-10-CM | POA: Diagnosis not present

## 2012-08-08 DIAGNOSIS — I5022 Chronic systolic (congestive) heart failure: Secondary | ICD-10-CM | POA: Diagnosis not present

## 2012-08-08 DIAGNOSIS — IMO0001 Reserved for inherently not codable concepts without codable children: Secondary | ICD-10-CM | POA: Diagnosis not present

## 2012-08-08 DIAGNOSIS — S7290XD Unspecified fracture of unspecified femur, subsequent encounter for closed fracture with routine healing: Secondary | ICD-10-CM | POA: Diagnosis not present

## 2012-08-08 DIAGNOSIS — Z7901 Long term (current) use of anticoagulants: Secondary | ICD-10-CM | POA: Diagnosis not present

## 2012-08-08 DIAGNOSIS — F329 Major depressive disorder, single episode, unspecified: Secondary | ICD-10-CM | POA: Diagnosis not present

## 2012-08-09 DIAGNOSIS — N318 Other neuromuscular dysfunction of bladder: Secondary | ICD-10-CM | POA: Diagnosis not present

## 2012-08-09 DIAGNOSIS — N816 Rectocele: Secondary | ICD-10-CM | POA: Diagnosis not present

## 2012-08-11 DIAGNOSIS — Z7901 Long term (current) use of anticoagulants: Secondary | ICD-10-CM | POA: Diagnosis not present

## 2012-08-11 DIAGNOSIS — F329 Major depressive disorder, single episode, unspecified: Secondary | ICD-10-CM | POA: Diagnosis not present

## 2012-08-11 DIAGNOSIS — S7290XD Unspecified fracture of unspecified femur, subsequent encounter for closed fracture with routine healing: Secondary | ICD-10-CM | POA: Diagnosis not present

## 2012-08-11 DIAGNOSIS — I5022 Chronic systolic (congestive) heart failure: Secondary | ICD-10-CM | POA: Diagnosis not present

## 2012-08-11 DIAGNOSIS — IMO0001 Reserved for inherently not codable concepts without codable children: Secondary | ICD-10-CM | POA: Diagnosis not present

## 2012-08-13 DIAGNOSIS — Z7901 Long term (current) use of anticoagulants: Secondary | ICD-10-CM | POA: Diagnosis not present

## 2012-08-13 DIAGNOSIS — S7290XD Unspecified fracture of unspecified femur, subsequent encounter for closed fracture with routine healing: Secondary | ICD-10-CM | POA: Diagnosis not present

## 2012-08-13 DIAGNOSIS — I5022 Chronic systolic (congestive) heart failure: Secondary | ICD-10-CM | POA: Diagnosis not present

## 2012-08-13 DIAGNOSIS — F329 Major depressive disorder, single episode, unspecified: Secondary | ICD-10-CM | POA: Diagnosis not present

## 2012-08-13 DIAGNOSIS — IMO0001 Reserved for inherently not codable concepts without codable children: Secondary | ICD-10-CM | POA: Diagnosis not present

## 2012-08-16 DIAGNOSIS — I5022 Chronic systolic (congestive) heart failure: Secondary | ICD-10-CM | POA: Diagnosis not present

## 2012-08-16 DIAGNOSIS — IMO0001 Reserved for inherently not codable concepts without codable children: Secondary | ICD-10-CM | POA: Diagnosis not present

## 2012-08-16 DIAGNOSIS — F329 Major depressive disorder, single episode, unspecified: Secondary | ICD-10-CM | POA: Diagnosis not present

## 2012-08-16 DIAGNOSIS — S7290XD Unspecified fracture of unspecified femur, subsequent encounter for closed fracture with routine healing: Secondary | ICD-10-CM | POA: Diagnosis not present

## 2012-08-16 DIAGNOSIS — Z7901 Long term (current) use of anticoagulants: Secondary | ICD-10-CM | POA: Diagnosis not present

## 2012-08-19 DIAGNOSIS — I5022 Chronic systolic (congestive) heart failure: Secondary | ICD-10-CM | POA: Diagnosis not present

## 2012-08-19 DIAGNOSIS — S7290XD Unspecified fracture of unspecified femur, subsequent encounter for closed fracture with routine healing: Secondary | ICD-10-CM | POA: Diagnosis not present

## 2012-08-19 DIAGNOSIS — IMO0001 Reserved for inherently not codable concepts without codable children: Secondary | ICD-10-CM | POA: Diagnosis not present

## 2012-08-19 DIAGNOSIS — Z7901 Long term (current) use of anticoagulants: Secondary | ICD-10-CM | POA: Diagnosis not present

## 2012-08-19 DIAGNOSIS — F329 Major depressive disorder, single episode, unspecified: Secondary | ICD-10-CM | POA: Diagnosis not present

## 2012-08-20 DIAGNOSIS — I5022 Chronic systolic (congestive) heart failure: Secondary | ICD-10-CM | POA: Diagnosis not present

## 2012-08-20 DIAGNOSIS — F329 Major depressive disorder, single episode, unspecified: Secondary | ICD-10-CM | POA: Diagnosis not present

## 2012-08-20 DIAGNOSIS — IMO0001 Reserved for inherently not codable concepts without codable children: Secondary | ICD-10-CM | POA: Diagnosis not present

## 2012-08-20 DIAGNOSIS — Z7901 Long term (current) use of anticoagulants: Secondary | ICD-10-CM | POA: Diagnosis not present

## 2012-08-20 DIAGNOSIS — S7290XD Unspecified fracture of unspecified femur, subsequent encounter for closed fracture with routine healing: Secondary | ICD-10-CM | POA: Diagnosis not present

## 2012-08-23 DIAGNOSIS — Z4789 Encounter for other orthopedic aftercare: Secondary | ICD-10-CM | POA: Diagnosis not present

## 2012-08-23 DIAGNOSIS — S7290XD Unspecified fracture of unspecified femur, subsequent encounter for closed fracture with routine healing: Secondary | ICD-10-CM | POA: Diagnosis not present

## 2012-08-23 DIAGNOSIS — F329 Major depressive disorder, single episode, unspecified: Secondary | ICD-10-CM | POA: Diagnosis not present

## 2012-08-23 DIAGNOSIS — I5022 Chronic systolic (congestive) heart failure: Secondary | ICD-10-CM | POA: Diagnosis not present

## 2012-08-23 DIAGNOSIS — IMO0001 Reserved for inherently not codable concepts without codable children: Secondary | ICD-10-CM | POA: Diagnosis not present

## 2012-08-23 DIAGNOSIS — Z7901 Long term (current) use of anticoagulants: Secondary | ICD-10-CM | POA: Diagnosis not present

## 2012-08-26 DIAGNOSIS — I5022 Chronic systolic (congestive) heart failure: Secondary | ICD-10-CM | POA: Diagnosis not present

## 2012-08-26 DIAGNOSIS — S7290XD Unspecified fracture of unspecified femur, subsequent encounter for closed fracture with routine healing: Secondary | ICD-10-CM | POA: Diagnosis not present

## 2012-08-26 DIAGNOSIS — Z7901 Long term (current) use of anticoagulants: Secondary | ICD-10-CM | POA: Diagnosis not present

## 2012-08-26 DIAGNOSIS — F329 Major depressive disorder, single episode, unspecified: Secondary | ICD-10-CM | POA: Diagnosis not present

## 2012-08-26 DIAGNOSIS — IMO0001 Reserved for inherently not codable concepts without codable children: Secondary | ICD-10-CM | POA: Diagnosis not present

## 2012-08-27 DIAGNOSIS — Z7901 Long term (current) use of anticoagulants: Secondary | ICD-10-CM | POA: Diagnosis not present

## 2012-08-27 DIAGNOSIS — F329 Major depressive disorder, single episode, unspecified: Secondary | ICD-10-CM | POA: Diagnosis not present

## 2012-08-27 DIAGNOSIS — S7290XD Unspecified fracture of unspecified femur, subsequent encounter for closed fracture with routine healing: Secondary | ICD-10-CM | POA: Diagnosis not present

## 2012-08-27 DIAGNOSIS — IMO0001 Reserved for inherently not codable concepts without codable children: Secondary | ICD-10-CM | POA: Diagnosis not present

## 2012-08-27 DIAGNOSIS — I5022 Chronic systolic (congestive) heart failure: Secondary | ICD-10-CM | POA: Diagnosis not present

## 2012-08-30 DIAGNOSIS — Z7901 Long term (current) use of anticoagulants: Secondary | ICD-10-CM | POA: Diagnosis not present

## 2012-08-30 DIAGNOSIS — IMO0001 Reserved for inherently not codable concepts without codable children: Secondary | ICD-10-CM | POA: Diagnosis not present

## 2012-08-30 DIAGNOSIS — I5022 Chronic systolic (congestive) heart failure: Secondary | ICD-10-CM | POA: Diagnosis not present

## 2012-08-30 DIAGNOSIS — S7290XD Unspecified fracture of unspecified femur, subsequent encounter for closed fracture with routine healing: Secondary | ICD-10-CM | POA: Diagnosis not present

## 2012-08-30 DIAGNOSIS — F329 Major depressive disorder, single episode, unspecified: Secondary | ICD-10-CM | POA: Diagnosis not present

## 2012-09-01 DIAGNOSIS — I5022 Chronic systolic (congestive) heart failure: Secondary | ICD-10-CM | POA: Diagnosis not present

## 2012-09-01 DIAGNOSIS — S7290XD Unspecified fracture of unspecified femur, subsequent encounter for closed fracture with routine healing: Secondary | ICD-10-CM | POA: Diagnosis not present

## 2012-09-01 DIAGNOSIS — Z7901 Long term (current) use of anticoagulants: Secondary | ICD-10-CM | POA: Diagnosis not present

## 2012-09-01 DIAGNOSIS — IMO0001 Reserved for inherently not codable concepts without codable children: Secondary | ICD-10-CM | POA: Diagnosis not present

## 2012-09-01 DIAGNOSIS — F329 Major depressive disorder, single episode, unspecified: Secondary | ICD-10-CM | POA: Diagnosis not present

## 2012-09-02 DIAGNOSIS — S7290XD Unspecified fracture of unspecified femur, subsequent encounter for closed fracture with routine healing: Secondary | ICD-10-CM | POA: Diagnosis not present

## 2012-09-02 DIAGNOSIS — I5022 Chronic systolic (congestive) heart failure: Secondary | ICD-10-CM | POA: Diagnosis not present

## 2012-09-02 DIAGNOSIS — IMO0001 Reserved for inherently not codable concepts without codable children: Secondary | ICD-10-CM | POA: Diagnosis not present

## 2012-09-02 DIAGNOSIS — Z7901 Long term (current) use of anticoagulants: Secondary | ICD-10-CM | POA: Diagnosis not present

## 2012-09-02 DIAGNOSIS — F329 Major depressive disorder, single episode, unspecified: Secondary | ICD-10-CM | POA: Diagnosis not present

## 2012-09-06 DIAGNOSIS — S7290XD Unspecified fracture of unspecified femur, subsequent encounter for closed fracture with routine healing: Secondary | ICD-10-CM | POA: Diagnosis not present

## 2012-09-06 DIAGNOSIS — IMO0001 Reserved for inherently not codable concepts without codable children: Secondary | ICD-10-CM | POA: Diagnosis not present

## 2012-09-06 DIAGNOSIS — F329 Major depressive disorder, single episode, unspecified: Secondary | ICD-10-CM | POA: Diagnosis not present

## 2012-09-06 DIAGNOSIS — Z7901 Long term (current) use of anticoagulants: Secondary | ICD-10-CM | POA: Diagnosis not present

## 2012-09-06 DIAGNOSIS — I5022 Chronic systolic (congestive) heart failure: Secondary | ICD-10-CM | POA: Diagnosis not present

## 2012-09-08 DIAGNOSIS — IMO0001 Reserved for inherently not codable concepts without codable children: Secondary | ICD-10-CM | POA: Diagnosis not present

## 2012-09-08 DIAGNOSIS — Z7901 Long term (current) use of anticoagulants: Secondary | ICD-10-CM | POA: Diagnosis not present

## 2012-09-08 DIAGNOSIS — S7290XD Unspecified fracture of unspecified femur, subsequent encounter for closed fracture with routine healing: Secondary | ICD-10-CM | POA: Diagnosis not present

## 2012-09-08 DIAGNOSIS — F329 Major depressive disorder, single episode, unspecified: Secondary | ICD-10-CM | POA: Diagnosis not present

## 2012-09-08 DIAGNOSIS — I5022 Chronic systolic (congestive) heart failure: Secondary | ICD-10-CM | POA: Diagnosis not present

## 2012-09-13 DIAGNOSIS — I5022 Chronic systolic (congestive) heart failure: Secondary | ICD-10-CM | POA: Diagnosis not present

## 2012-09-13 DIAGNOSIS — F329 Major depressive disorder, single episode, unspecified: Secondary | ICD-10-CM | POA: Diagnosis not present

## 2012-09-13 DIAGNOSIS — Z7901 Long term (current) use of anticoagulants: Secondary | ICD-10-CM | POA: Diagnosis not present

## 2012-09-13 DIAGNOSIS — S7290XD Unspecified fracture of unspecified femur, subsequent encounter for closed fracture with routine healing: Secondary | ICD-10-CM | POA: Diagnosis not present

## 2012-09-13 DIAGNOSIS — IMO0001 Reserved for inherently not codable concepts without codable children: Secondary | ICD-10-CM | POA: Diagnosis not present

## 2012-09-15 DIAGNOSIS — Z7901 Long term (current) use of anticoagulants: Secondary | ICD-10-CM | POA: Diagnosis not present

## 2012-09-15 DIAGNOSIS — IMO0001 Reserved for inherently not codable concepts without codable children: Secondary | ICD-10-CM | POA: Diagnosis not present

## 2012-09-15 DIAGNOSIS — F329 Major depressive disorder, single episode, unspecified: Secondary | ICD-10-CM | POA: Diagnosis not present

## 2012-09-15 DIAGNOSIS — I5022 Chronic systolic (congestive) heart failure: Secondary | ICD-10-CM | POA: Diagnosis not present

## 2012-09-15 DIAGNOSIS — S7290XD Unspecified fracture of unspecified femur, subsequent encounter for closed fracture with routine healing: Secondary | ICD-10-CM | POA: Diagnosis not present

## 2012-09-20 DIAGNOSIS — I5022 Chronic systolic (congestive) heart failure: Secondary | ICD-10-CM | POA: Diagnosis not present

## 2012-09-20 DIAGNOSIS — S7290XD Unspecified fracture of unspecified femur, subsequent encounter for closed fracture with routine healing: Secondary | ICD-10-CM | POA: Diagnosis not present

## 2012-09-20 DIAGNOSIS — S42023A Displaced fracture of shaft of unspecified clavicle, initial encounter for closed fracture: Secondary | ICD-10-CM | POA: Diagnosis not present

## 2012-09-20 DIAGNOSIS — S72453A Displaced supracondylar fracture without intracondylar extension of lower end of unspecified femur, initial encounter for closed fracture: Secondary | ICD-10-CM | POA: Diagnosis not present

## 2012-09-20 DIAGNOSIS — F329 Major depressive disorder, single episode, unspecified: Secondary | ICD-10-CM | POA: Diagnosis not present

## 2012-09-20 DIAGNOSIS — IMO0001 Reserved for inherently not codable concepts without codable children: Secondary | ICD-10-CM | POA: Diagnosis not present

## 2012-09-20 DIAGNOSIS — I10 Essential (primary) hypertension: Secondary | ICD-10-CM | POA: Diagnosis not present

## 2012-09-22 DIAGNOSIS — IMO0001 Reserved for inherently not codable concepts without codable children: Secondary | ICD-10-CM | POA: Diagnosis not present

## 2012-09-22 DIAGNOSIS — F329 Major depressive disorder, single episode, unspecified: Secondary | ICD-10-CM | POA: Diagnosis not present

## 2012-09-22 DIAGNOSIS — I10 Essential (primary) hypertension: Secondary | ICD-10-CM | POA: Diagnosis not present

## 2012-09-22 DIAGNOSIS — S7290XD Unspecified fracture of unspecified femur, subsequent encounter for closed fracture with routine healing: Secondary | ICD-10-CM | POA: Diagnosis not present

## 2012-09-22 DIAGNOSIS — I5022 Chronic systolic (congestive) heart failure: Secondary | ICD-10-CM | POA: Diagnosis not present

## 2012-09-27 DIAGNOSIS — F329 Major depressive disorder, single episode, unspecified: Secondary | ICD-10-CM | POA: Diagnosis not present

## 2012-09-27 DIAGNOSIS — I5022 Chronic systolic (congestive) heart failure: Secondary | ICD-10-CM | POA: Diagnosis not present

## 2012-09-27 DIAGNOSIS — IMO0001 Reserved for inherently not codable concepts without codable children: Secondary | ICD-10-CM | POA: Diagnosis not present

## 2012-09-27 DIAGNOSIS — I10 Essential (primary) hypertension: Secondary | ICD-10-CM | POA: Diagnosis not present

## 2012-09-27 DIAGNOSIS — S7290XD Unspecified fracture of unspecified femur, subsequent encounter for closed fracture with routine healing: Secondary | ICD-10-CM | POA: Diagnosis not present

## 2012-09-29 DIAGNOSIS — S7290XD Unspecified fracture of unspecified femur, subsequent encounter for closed fracture with routine healing: Secondary | ICD-10-CM | POA: Diagnosis not present

## 2012-09-29 DIAGNOSIS — F329 Major depressive disorder, single episode, unspecified: Secondary | ICD-10-CM | POA: Diagnosis not present

## 2012-09-29 DIAGNOSIS — IMO0001 Reserved for inherently not codable concepts without codable children: Secondary | ICD-10-CM | POA: Diagnosis not present

## 2012-09-29 DIAGNOSIS — I5022 Chronic systolic (congestive) heart failure: Secondary | ICD-10-CM | POA: Diagnosis not present

## 2012-09-29 DIAGNOSIS — I10 Essential (primary) hypertension: Secondary | ICD-10-CM | POA: Diagnosis not present

## 2012-10-01 DIAGNOSIS — M79609 Pain in unspecified limb: Secondary | ICD-10-CM | POA: Diagnosis not present

## 2012-10-01 DIAGNOSIS — B351 Tinea unguium: Secondary | ICD-10-CM | POA: Diagnosis not present

## 2012-10-04 DIAGNOSIS — F329 Major depressive disorder, single episode, unspecified: Secondary | ICD-10-CM | POA: Diagnosis not present

## 2012-10-04 DIAGNOSIS — I10 Essential (primary) hypertension: Secondary | ICD-10-CM | POA: Diagnosis not present

## 2012-10-04 DIAGNOSIS — I5022 Chronic systolic (congestive) heart failure: Secondary | ICD-10-CM | POA: Diagnosis not present

## 2012-10-04 DIAGNOSIS — S7290XD Unspecified fracture of unspecified femur, subsequent encounter for closed fracture with routine healing: Secondary | ICD-10-CM | POA: Diagnosis not present

## 2012-10-04 DIAGNOSIS — IMO0001 Reserved for inherently not codable concepts without codable children: Secondary | ICD-10-CM | POA: Diagnosis not present

## 2012-10-09 ENCOUNTER — Other Ambulatory Visit: Payer: Self-pay

## 2012-10-09 DIAGNOSIS — IMO0001 Reserved for inherently not codable concepts without codable children: Secondary | ICD-10-CM | POA: Diagnosis not present

## 2012-10-09 DIAGNOSIS — F329 Major depressive disorder, single episode, unspecified: Secondary | ICD-10-CM | POA: Diagnosis not present

## 2012-10-09 DIAGNOSIS — S7290XD Unspecified fracture of unspecified femur, subsequent encounter for closed fracture with routine healing: Secondary | ICD-10-CM | POA: Diagnosis not present

## 2012-10-09 DIAGNOSIS — I10 Essential (primary) hypertension: Secondary | ICD-10-CM | POA: Diagnosis not present

## 2012-10-09 DIAGNOSIS — I5022 Chronic systolic (congestive) heart failure: Secondary | ICD-10-CM | POA: Diagnosis not present

## 2012-11-23 ENCOUNTER — Other Ambulatory Visit: Payer: Self-pay | Admitting: Internal Medicine

## 2012-11-23 DIAGNOSIS — Z1231 Encounter for screening mammogram for malignant neoplasm of breast: Secondary | ICD-10-CM

## 2012-12-08 ENCOUNTER — Ambulatory Visit: Payer: Medicare Other

## 2012-12-09 DIAGNOSIS — I509 Heart failure, unspecified: Secondary | ICD-10-CM | POA: Diagnosis not present

## 2012-12-09 DIAGNOSIS — K219 Gastro-esophageal reflux disease without esophagitis: Secondary | ICD-10-CM | POA: Diagnosis not present

## 2012-12-09 DIAGNOSIS — K4090 Unilateral inguinal hernia, without obstruction or gangrene, not specified as recurrent: Secondary | ICD-10-CM | POA: Diagnosis not present

## 2012-12-09 DIAGNOSIS — N3942 Incontinence without sensory awareness: Secondary | ICD-10-CM | POA: Diagnosis not present

## 2012-12-09 DIAGNOSIS — I10 Essential (primary) hypertension: Secondary | ICD-10-CM | POA: Diagnosis not present

## 2012-12-09 DIAGNOSIS — E782 Mixed hyperlipidemia: Secondary | ICD-10-CM | POA: Diagnosis not present

## 2012-12-17 ENCOUNTER — Ambulatory Visit
Admission: RE | Admit: 2012-12-17 | Discharge: 2012-12-17 | Disposition: A | Discharge status: Home / Self Care | Payer: Medicare Other | Source: Ambulatory Visit | Attending: Internal Medicine | Admitting: Internal Medicine

## 2012-12-17 DIAGNOSIS — Z1231 Encounter for screening mammogram for malignant neoplasm of breast: Secondary | ICD-10-CM

## 2012-12-22 DIAGNOSIS — N318 Other neuromuscular dysfunction of bladder: Secondary | ICD-10-CM | POA: Diagnosis not present

## 2012-12-22 DIAGNOSIS — N816 Rectocele: Secondary | ICD-10-CM | POA: Diagnosis not present

## 2012-12-29 DIAGNOSIS — S72453A Displaced supracondylar fracture without intracondylar extension of lower end of unspecified femur, initial encounter for closed fracture: Secondary | ICD-10-CM | POA: Diagnosis not present

## 2013-01-31 DIAGNOSIS — B351 Tinea unguium: Secondary | ICD-10-CM | POA: Diagnosis not present

## 2013-01-31 DIAGNOSIS — M79609 Pain in unspecified limb: Secondary | ICD-10-CM | POA: Diagnosis not present

## 2013-02-01 DIAGNOSIS — H903 Sensorineural hearing loss, bilateral: Secondary | ICD-10-CM | POA: Diagnosis not present

## 2013-02-01 DIAGNOSIS — H6120 Impacted cerumen, unspecified ear: Secondary | ICD-10-CM | POA: Diagnosis not present

## 2013-02-08 ENCOUNTER — Ambulatory Visit: Payer: Medicare Other | Admitting: Vascular Surgery

## 2013-02-08 ENCOUNTER — Encounter (INDEPENDENT_AMBULATORY_CARE_PROVIDER_SITE_OTHER): Payer: Medicare Other | Admitting: *Deleted

## 2013-02-08 DIAGNOSIS — I714 Abdominal aortic aneurysm, without rupture: Secondary | ICD-10-CM | POA: Diagnosis not present

## 2013-02-11 ENCOUNTER — Other Ambulatory Visit: Payer: Self-pay

## 2013-02-11 DIAGNOSIS — I714 Abdominal aortic aneurysm, without rupture: Secondary | ICD-10-CM

## 2013-02-16 ENCOUNTER — Encounter: Payer: Self-pay | Admitting: Vascular Surgery

## 2013-02-23 DIAGNOSIS — E785 Hyperlipidemia, unspecified: Secondary | ICD-10-CM | POA: Diagnosis not present

## 2013-02-23 DIAGNOSIS — I359 Nonrheumatic aortic valve disorder, unspecified: Secondary | ICD-10-CM | POA: Diagnosis not present

## 2013-02-23 DIAGNOSIS — I5022 Chronic systolic (congestive) heart failure: Secondary | ICD-10-CM | POA: Diagnosis not present

## 2013-02-23 DIAGNOSIS — Z950 Presence of cardiac pacemaker: Secondary | ICD-10-CM | POA: Diagnosis not present

## 2013-02-23 DIAGNOSIS — I714 Abdominal aortic aneurysm, without rupture: Secondary | ICD-10-CM | POA: Diagnosis not present

## 2013-02-23 DIAGNOSIS — I447 Left bundle-branch block, unspecified: Secondary | ICD-10-CM | POA: Diagnosis not present

## 2013-02-23 DIAGNOSIS — I059 Rheumatic mitral valve disease, unspecified: Secondary | ICD-10-CM | POA: Diagnosis not present

## 2013-03-30 ENCOUNTER — Other Ambulatory Visit: Payer: Self-pay

## 2013-04-04 DIAGNOSIS — D649 Anemia, unspecified: Secondary | ICD-10-CM | POA: Diagnosis not present

## 2013-04-04 DIAGNOSIS — Z0000 Encounter for general adult medical examination without abnormal findings: Secondary | ICD-10-CM | POA: Diagnosis not present

## 2013-04-04 DIAGNOSIS — I10 Essential (primary) hypertension: Secondary | ICD-10-CM | POA: Diagnosis not present

## 2013-04-04 DIAGNOSIS — Z1331 Encounter for screening for depression: Secondary | ICD-10-CM | POA: Diagnosis not present

## 2013-04-04 DIAGNOSIS — N3942 Incontinence without sensory awareness: Secondary | ICD-10-CM | POA: Diagnosis not present

## 2013-04-04 DIAGNOSIS — I509 Heart failure, unspecified: Secondary | ICD-10-CM | POA: Diagnosis not present

## 2013-04-04 DIAGNOSIS — E782 Mixed hyperlipidemia: Secondary | ICD-10-CM | POA: Diagnosis not present

## 2013-04-04 DIAGNOSIS — K219 Gastro-esophageal reflux disease without esophagitis: Secondary | ICD-10-CM | POA: Diagnosis not present

## 2013-04-26 DIAGNOSIS — I059 Rheumatic mitral valve disease, unspecified: Secondary | ICD-10-CM | POA: Diagnosis not present

## 2013-04-26 DIAGNOSIS — I447 Left bundle-branch block, unspecified: Secondary | ICD-10-CM | POA: Diagnosis not present

## 2013-04-26 DIAGNOSIS — Z950 Presence of cardiac pacemaker: Secondary | ICD-10-CM | POA: Diagnosis not present

## 2013-04-26 DIAGNOSIS — E785 Hyperlipidemia, unspecified: Secondary | ICD-10-CM | POA: Diagnosis not present

## 2013-04-26 DIAGNOSIS — I714 Abdominal aortic aneurysm, without rupture: Secondary | ICD-10-CM | POA: Diagnosis not present

## 2013-04-26 DIAGNOSIS — I359 Nonrheumatic aortic valve disorder, unspecified: Secondary | ICD-10-CM | POA: Diagnosis not present

## 2013-04-26 DIAGNOSIS — I5022 Chronic systolic (congestive) heart failure: Secondary | ICD-10-CM | POA: Diagnosis not present

## 2013-04-27 DIAGNOSIS — D235 Other benign neoplasm of skin of trunk: Secondary | ICD-10-CM | POA: Diagnosis not present

## 2013-04-27 DIAGNOSIS — D485 Neoplasm of uncertain behavior of skin: Secondary | ICD-10-CM | POA: Diagnosis not present

## 2013-05-09 DIAGNOSIS — H3530 Unspecified macular degeneration: Secondary | ICD-10-CM | POA: Diagnosis not present

## 2013-05-10 DIAGNOSIS — M810 Age-related osteoporosis without current pathological fracture: Secondary | ICD-10-CM | POA: Diagnosis not present

## 2013-05-16 DIAGNOSIS — B351 Tinea unguium: Secondary | ICD-10-CM | POA: Diagnosis not present

## 2013-05-16 DIAGNOSIS — M79609 Pain in unspecified limb: Secondary | ICD-10-CM | POA: Diagnosis not present

## 2013-05-25 DIAGNOSIS — I714 Abdominal aortic aneurysm, without rupture: Secondary | ICD-10-CM | POA: Diagnosis not present

## 2013-05-25 DIAGNOSIS — I5022 Chronic systolic (congestive) heart failure: Secondary | ICD-10-CM | POA: Diagnosis not present

## 2013-05-25 DIAGNOSIS — I447 Left bundle-branch block, unspecified: Secondary | ICD-10-CM | POA: Diagnosis not present

## 2013-05-25 DIAGNOSIS — I359 Nonrheumatic aortic valve disorder, unspecified: Secondary | ICD-10-CM | POA: Diagnosis not present

## 2013-05-25 DIAGNOSIS — E785 Hyperlipidemia, unspecified: Secondary | ICD-10-CM | POA: Diagnosis not present

## 2013-05-25 DIAGNOSIS — I059 Rheumatic mitral valve disease, unspecified: Secondary | ICD-10-CM | POA: Diagnosis not present

## 2013-05-25 DIAGNOSIS — Z950 Presence of cardiac pacemaker: Secondary | ICD-10-CM | POA: Diagnosis not present

## 2013-06-29 DIAGNOSIS — N816 Rectocele: Secondary | ICD-10-CM | POA: Diagnosis not present

## 2013-06-29 DIAGNOSIS — R159 Full incontinence of feces: Secondary | ICD-10-CM | POA: Diagnosis not present

## 2013-06-29 DIAGNOSIS — N318 Other neuromuscular dysfunction of bladder: Secondary | ICD-10-CM | POA: Diagnosis not present

## 2013-06-30 ENCOUNTER — Other Ambulatory Visit: Payer: Self-pay

## 2013-08-15 ENCOUNTER — Ambulatory Visit: Payer: Self-pay | Admitting: Podiatry

## 2013-08-31 DIAGNOSIS — R55 Syncope and collapse: Secondary | ICD-10-CM | POA: Diagnosis not present

## 2013-08-31 DIAGNOSIS — Z950 Presence of cardiac pacemaker: Secondary | ICD-10-CM | POA: Diagnosis not present

## 2013-08-31 DIAGNOSIS — I5022 Chronic systolic (congestive) heart failure: Secondary | ICD-10-CM | POA: Diagnosis not present

## 2013-09-12 ENCOUNTER — Encounter: Payer: Self-pay | Admitting: Podiatry

## 2013-09-12 ENCOUNTER — Ambulatory Visit (INDEPENDENT_AMBULATORY_CARE_PROVIDER_SITE_OTHER): Payer: Medicare Other | Admitting: Podiatry

## 2013-09-12 VITALS — BP 122/70 | HR 76 | Resp 18

## 2013-09-12 DIAGNOSIS — B351 Tinea unguium: Secondary | ICD-10-CM | POA: Diagnosis not present

## 2013-09-12 DIAGNOSIS — M79609 Pain in unspecified limb: Secondary | ICD-10-CM | POA: Diagnosis not present

## 2013-09-12 NOTE — Progress Notes (Signed)
   Subjective:    Patient ID: [name], female    DOB: 03/19/1920, 80 y.o.   MRN: [id number]  HPI I need my toenails cut and they are long    Review of Systems  Constitutional: Negative.   HENT: Negative.   Eyes: Negative.   Cardiovascular: Negative.   Gastrointestinal: Negative.   Endocrine: Negative.   Genitourinary: Negative.   Musculoskeletal: Negative.   Skin: Negative.   Allergic/Immunologic: Negative.   Neurological: Negative.   Hematological: Negative.   Psychiatric/Behavioral: Negative.        Objective:   Physical Exam  Orientated x42 80 year old white female presents with your caregiver for ongoing debridement of painful toenails. Last visit for this service was 05/16/2013 .She has been a patient in this practice since 2008.  Dermatological: Elongated, discolored, brittle toenails x10  Assessment: Symptomatic onychomycoses  Plan: Nails x10 are debrided back without any bleeding. Reappoint at three-month intervals.      Assessment & Plan:

## 2013-11-04 DIAGNOSIS — I714 Abdominal aortic aneurysm, without rupture, unspecified: Secondary | ICD-10-CM | POA: Diagnosis not present

## 2013-11-04 DIAGNOSIS — E785 Hyperlipidemia, unspecified: Secondary | ICD-10-CM | POA: Diagnosis not present

## 2013-11-04 DIAGNOSIS — Z950 Presence of cardiac pacemaker: Secondary | ICD-10-CM | POA: Diagnosis not present

## 2013-11-04 DIAGNOSIS — I059 Rheumatic mitral valve disease, unspecified: Secondary | ICD-10-CM | POA: Diagnosis not present

## 2013-11-04 DIAGNOSIS — I447 Left bundle-branch block, unspecified: Secondary | ICD-10-CM | POA: Diagnosis not present

## 2013-11-04 DIAGNOSIS — I5022 Chronic systolic (congestive) heart failure: Secondary | ICD-10-CM | POA: Diagnosis not present

## 2013-11-04 DIAGNOSIS — I359 Nonrheumatic aortic valve disorder, unspecified: Secondary | ICD-10-CM | POA: Diagnosis not present

## 2013-12-05 ENCOUNTER — Ambulatory Visit: Payer: PRIVATE HEALTH INSURANCE | Admitting: Podiatry

## 2013-12-16 DIAGNOSIS — N3000 Acute cystitis without hematuria: Secondary | ICD-10-CM | POA: Diagnosis not present

## 2013-12-16 DIAGNOSIS — R159 Full incontinence of feces: Secondary | ICD-10-CM | POA: Diagnosis not present

## 2013-12-16 DIAGNOSIS — N816 Rectocele: Secondary | ICD-10-CM | POA: Diagnosis not present

## 2013-12-16 DIAGNOSIS — N318 Other neuromuscular dysfunction of bladder: Secondary | ICD-10-CM | POA: Diagnosis not present

## 2014-01-09 ENCOUNTER — Ambulatory Visit
Admission: RE | Admit: 2014-01-09 | Discharge: 2014-01-09 | Disposition: A | Discharge status: Home / Self Care | Payer: Medicare Other | Source: Ambulatory Visit | Attending: Internal Medicine | Admitting: Internal Medicine

## 2014-01-09 ENCOUNTER — Other Ambulatory Visit: Payer: Self-pay | Admitting: Internal Medicine

## 2014-01-09 DIAGNOSIS — I509 Heart failure, unspecified: Secondary | ICD-10-CM | POA: Diagnosis not present

## 2014-01-09 DIAGNOSIS — I10 Essential (primary) hypertension: Secondary | ICD-10-CM | POA: Diagnosis not present

## 2014-01-09 DIAGNOSIS — N183 Chronic kidney disease, stage 3 unspecified: Secondary | ICD-10-CM | POA: Diagnosis not present

## 2014-01-09 DIAGNOSIS — IMO0001 Reserved for inherently not codable concepts without codable children: Secondary | ICD-10-CM

## 2014-01-09 DIAGNOSIS — E782 Mixed hyperlipidemia: Secondary | ICD-10-CM | POA: Diagnosis not present

## 2014-01-09 DIAGNOSIS — IMO0002 Reserved for concepts with insufficient information to code with codable children: Secondary | ICD-10-CM | POA: Diagnosis not present

## 2014-01-09 DIAGNOSIS — M25559 Pain in unspecified hip: Secondary | ICD-10-CM | POA: Diagnosis not present

## 2014-01-29 DIAGNOSIS — Y92009 Unspecified place in unspecified non-institutional (private) residence as the place of occurrence of the external cause: Secondary | ICD-10-CM

## 2014-01-29 DIAGNOSIS — W19XXXA Unspecified fall, initial encounter: Secondary | ICD-10-CM

## 2014-01-29 HISTORY — DX: Unspecified fall, initial encounter: Y92.009

## 2014-01-29 HISTORY — DX: Unspecified fall, initial encounter: W19.XXXA

## 2014-01-29 IMAGING — CR DG KNEE COMPLETE 4+V*R*
4 series · 4 of 4 positions shown · non-contrast
Comparison: Intraoperative images 07/14/2011.

CLINICAL DATA: [AGE] female status post fall with pain.

RIGHT KNEE - COMPLETE 4+ VIEW

[x knee ap left]
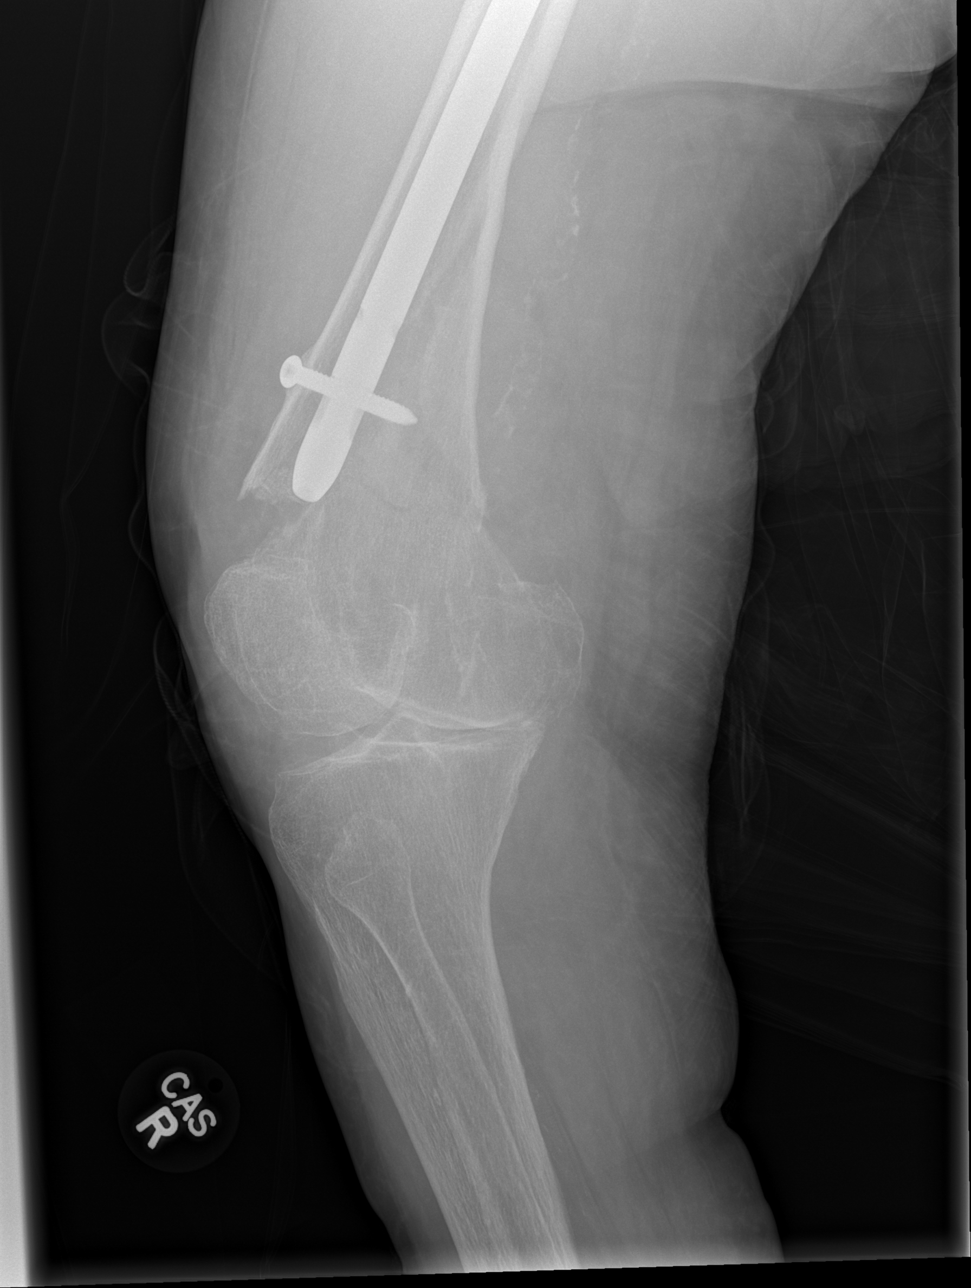

[x knee obl left (1 of 2)]
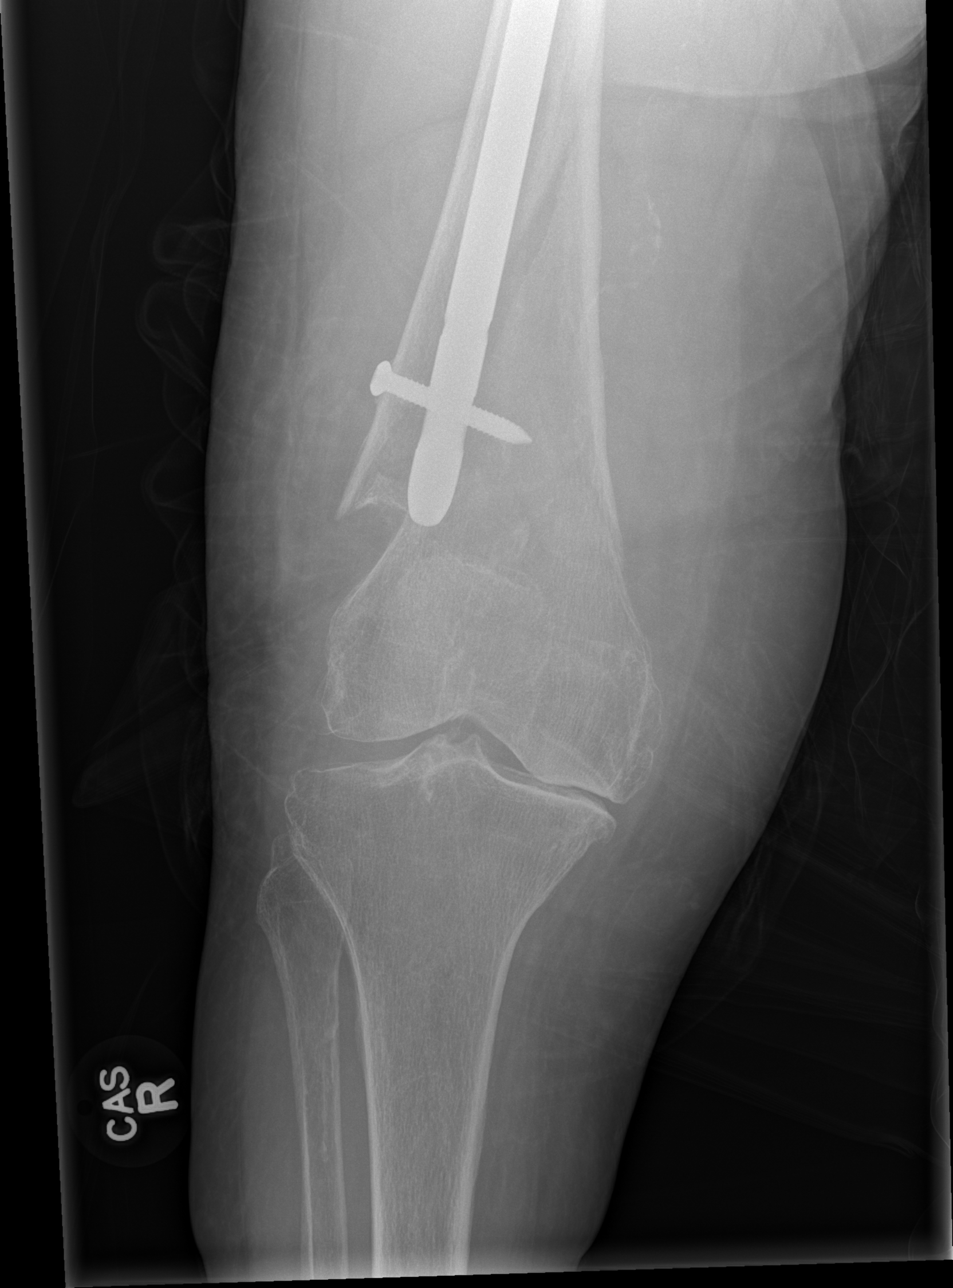

[x knee obl left (2 of 2)]
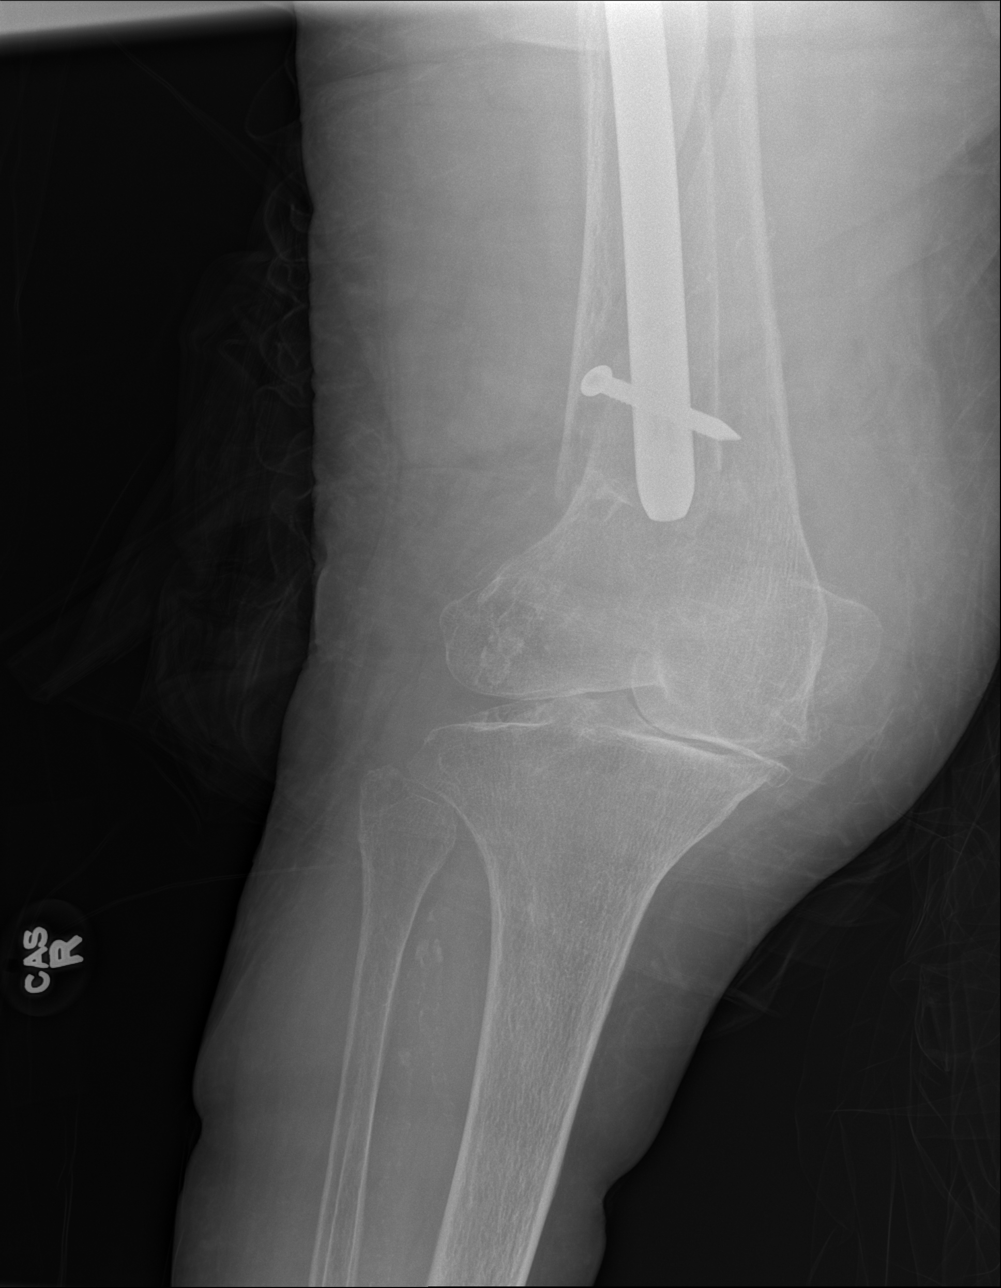

[x knee lat left]
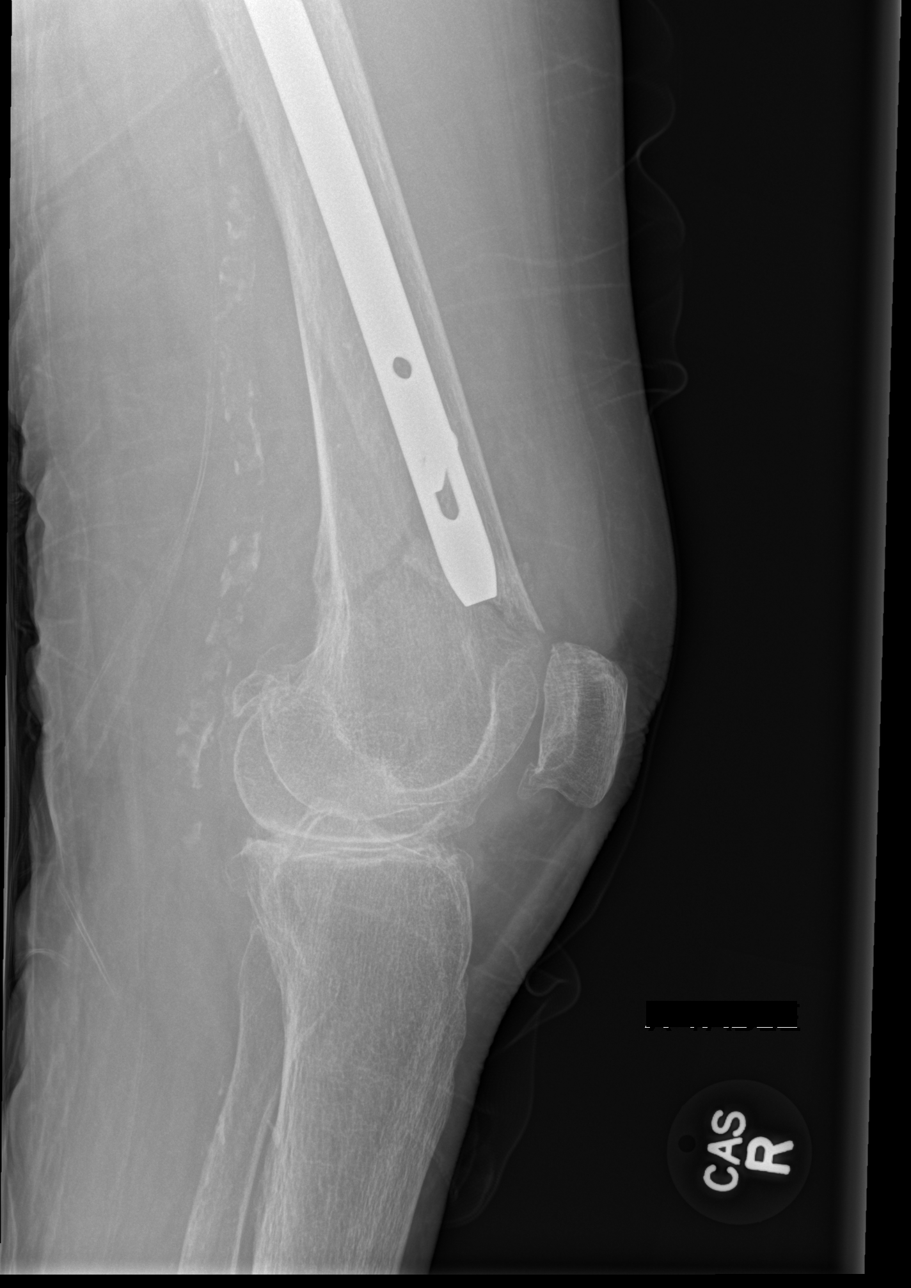

[4 of 4 positions shown; findings below may reference images not displayed]

FINDINGS: Spiral comminuted fracture of the distal right femur.
The proximal aspect of the fracture may not be entirely included on
these images.  The fracture terminates at the level of the distal
intramedullary rod.  The fracture plane does appear to extend into
the patellofemoral joint space, the other knee joint spaces appear
spared.  Medial angulation.  Proximal tibia and fibula appear
intact.  Calcified atherosclerosis.
IMPRESSION: Spiral, comminuted fracture of the distal right femoral shaft and
metadiaphysis about the intramedullary rod.  Fracture probably does
extend into the patellofemoral joint.  Medial angulation of the
distal fragment.

## 2014-01-29 IMAGING — CR DG FEMUR 2+V*R*
4 series · 4 of 4 positions shown · non-contrast
Comparison: 05/18/2012.

CLINICAL DATA: Right femur fracture.

RIGHT FEMUR - 2 VIEW

[w hip lat right]
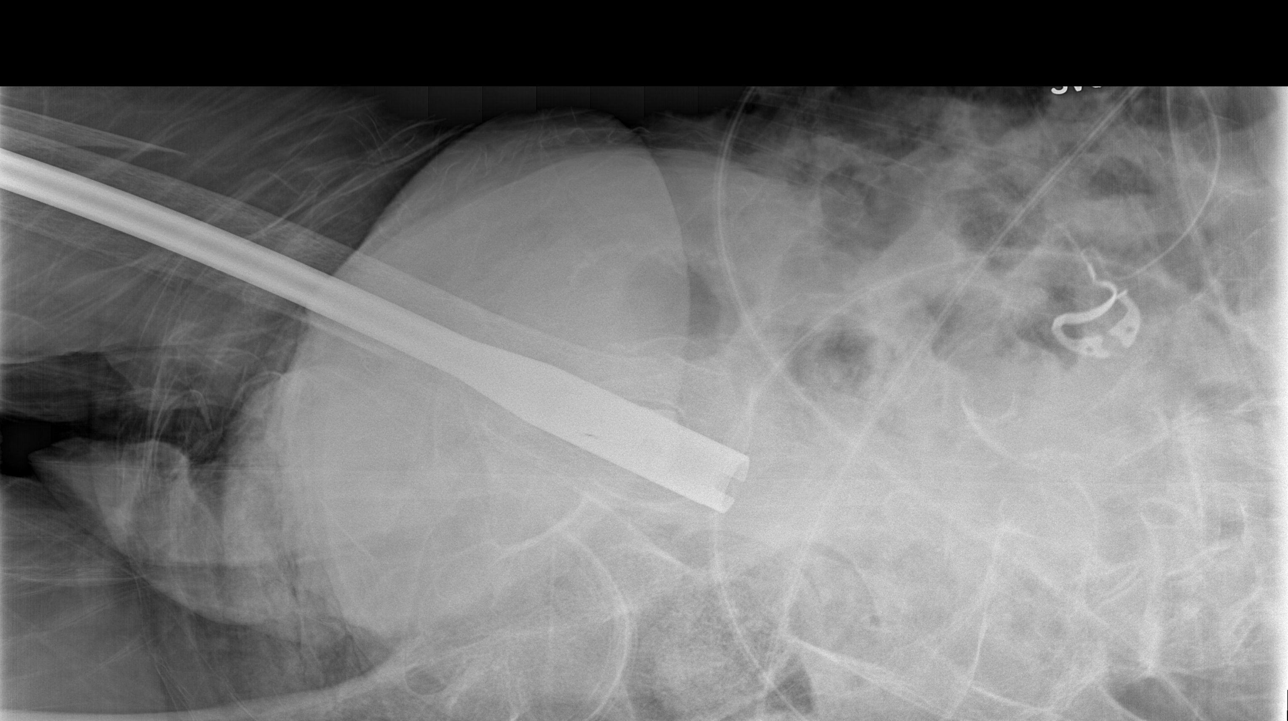

[w femur distal lat right]
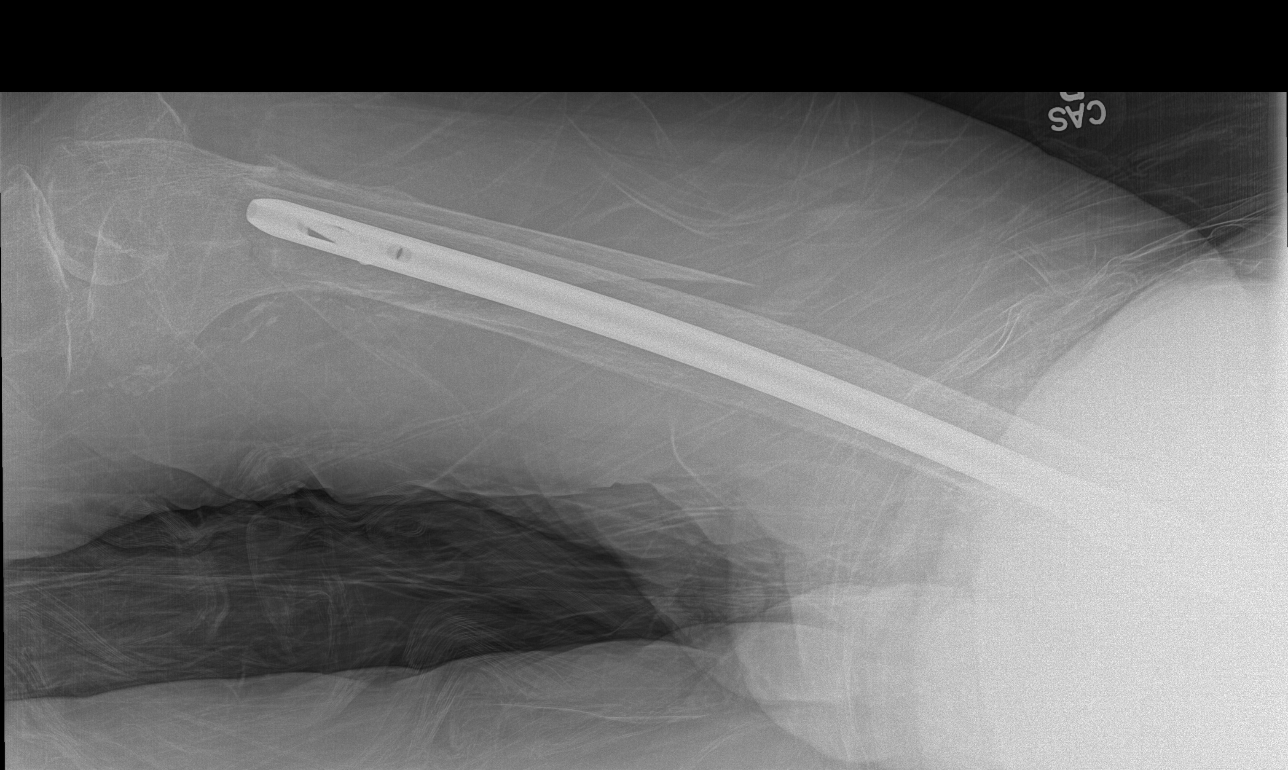

[x femur proximal ap right]
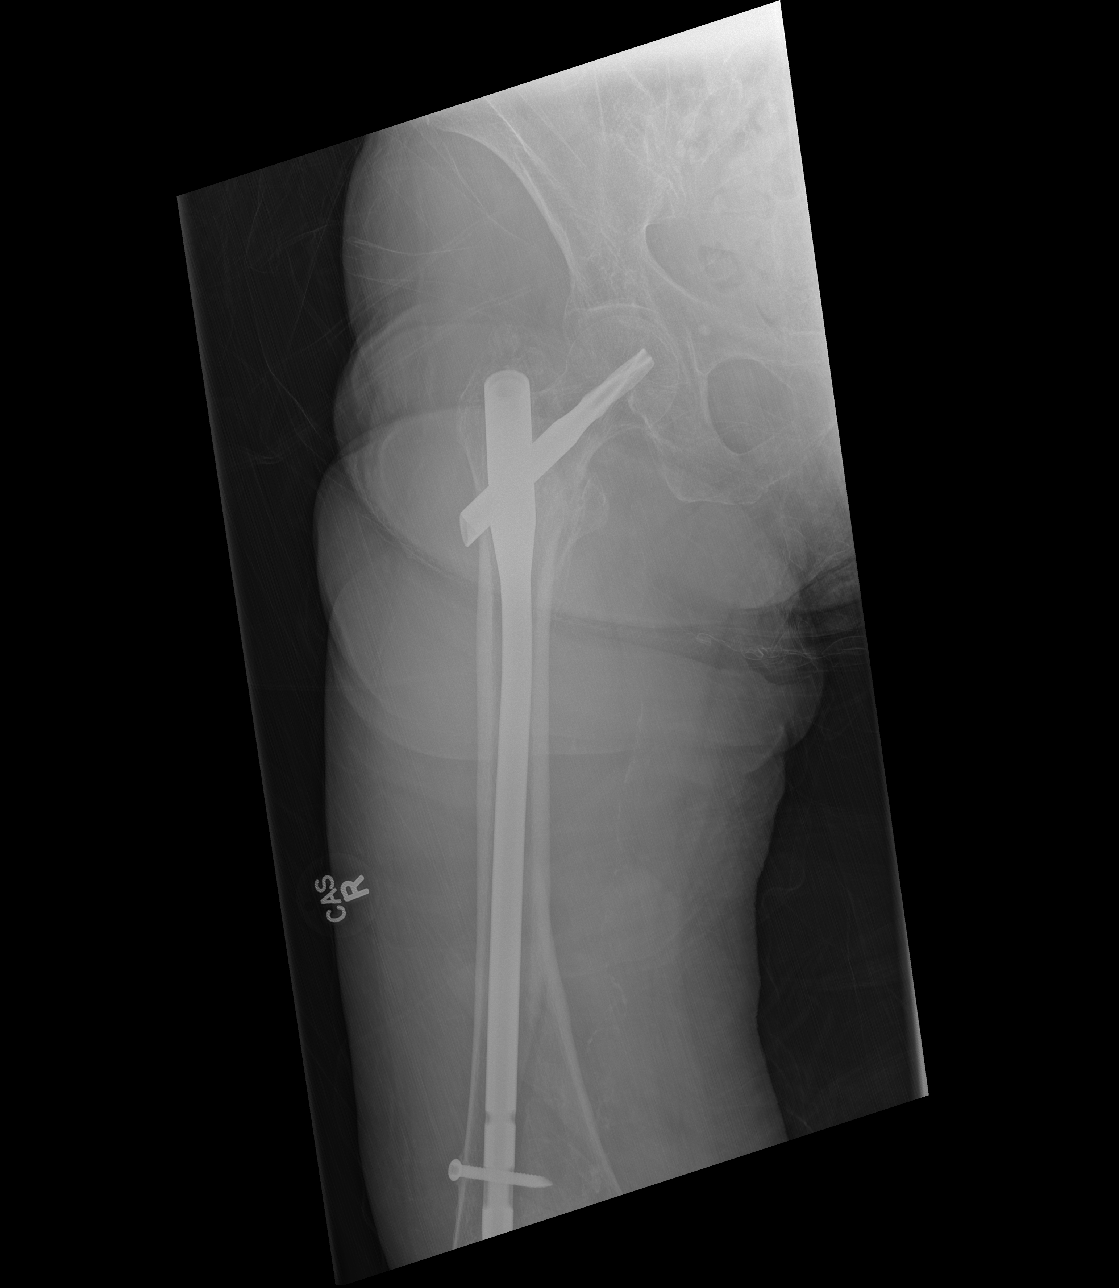

[x femur distal ap right]
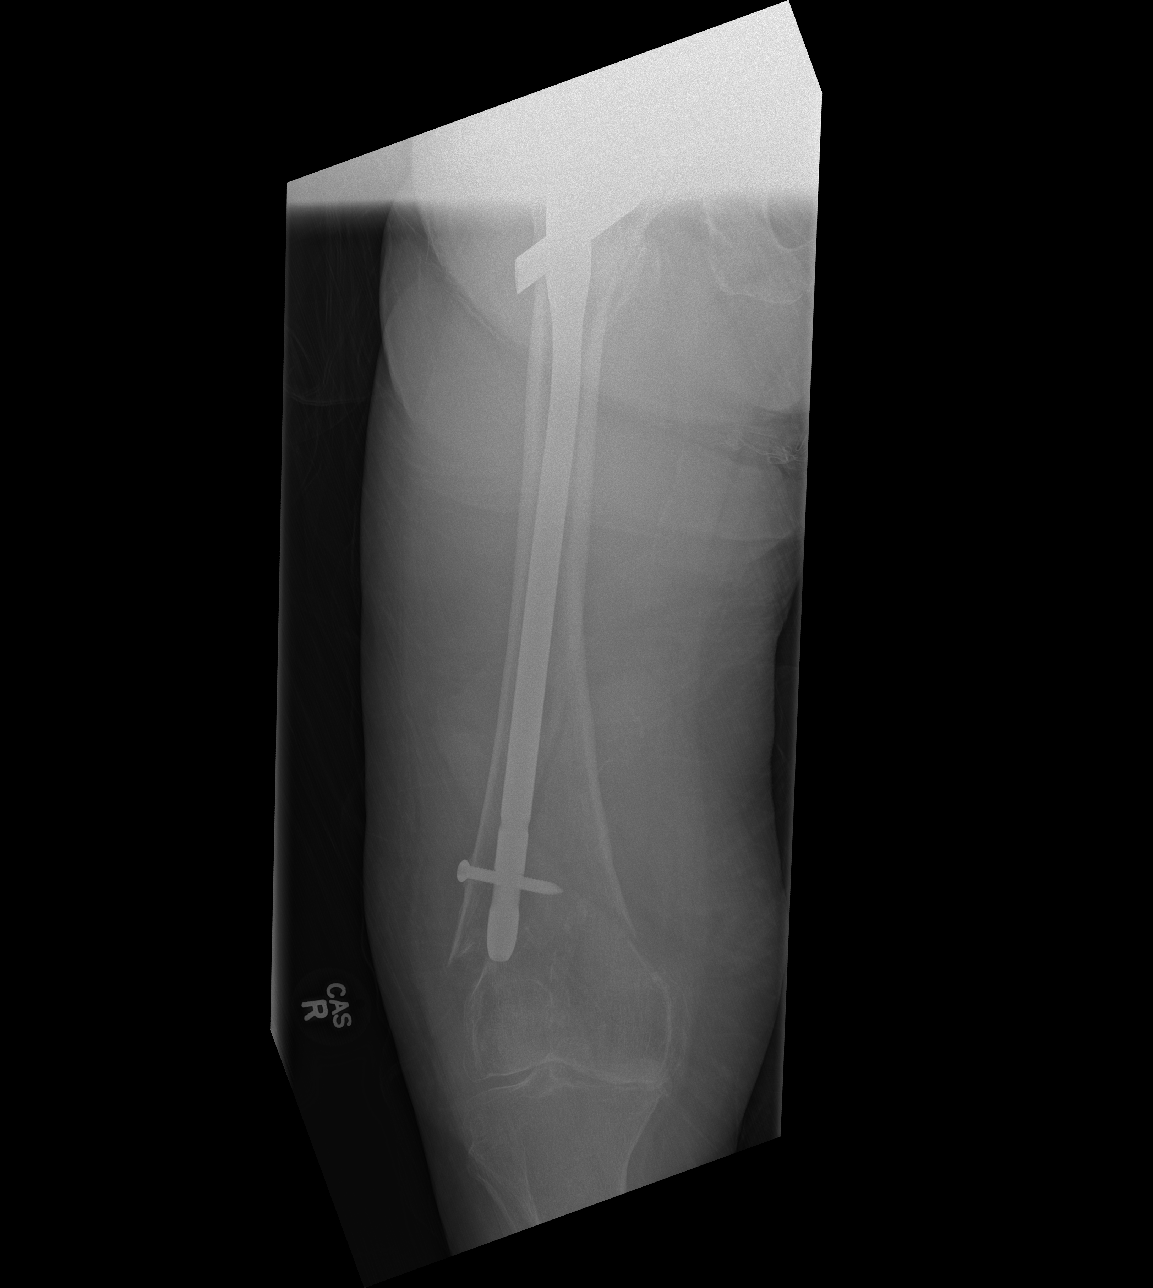

[4 of 4 positions shown; findings below may reference images not displayed]

FINDINGS: Antegrade right femoral nail with gamma nail fixation of
the right hip.

There is an oblique fracture of the distal right femoral diaphysis
extending into the metaphysis.  This is a periprosthetic fracture.
Distal interlocking screw in the right femoral nail follows the
lateral fracture fragment.  There appears to be a butterfly
fragment in the medial metadiaphysis.  Knee osteoarthritis is
incidentally noted.

There is mild medial displacement of the distal femur relative to
the proximal femoral shaft.  Diffuse osteopenia.  On the lateral
view, there is mild apex anterior angulation and one cortex width
anterior displacement of the distal femur.  Atherosclerosis is
present.
IMPRESSION: Periprosthetic fracture of the distal femoral metadiaphysis.
Probable medial butterfly fragment.

Proximal intertrochanteric fracture appears healed.

## 2014-02-01 DIAGNOSIS — H903 Sensorineural hearing loss, bilateral: Secondary | ICD-10-CM | POA: Diagnosis not present

## 2014-02-20 ENCOUNTER — Encounter: Payer: Self-pay | Admitting: Family

## 2014-02-21 ENCOUNTER — Encounter: Payer: Self-pay | Admitting: Family

## 2014-02-21 ENCOUNTER — Ambulatory Visit (HOSPITAL_COMMUNITY)
Admission: RE | Admit: 2014-02-21 | Discharge: 2014-02-21 | Disposition: A | Discharge status: Home / Self Care | Payer: Medicare Other | Source: Ambulatory Visit | Attending: Family | Admitting: Family

## 2014-02-21 ENCOUNTER — Ambulatory Visit (INDEPENDENT_AMBULATORY_CARE_PROVIDER_SITE_OTHER): Payer: Medicare Other | Admitting: Family

## 2014-02-21 ENCOUNTER — Other Ambulatory Visit (HOSPITAL_COMMUNITY): Payer: Medicare Other

## 2014-02-21 ENCOUNTER — Ambulatory Visit: Payer: Medicare Other | Admitting: Vascular Surgery

## 2014-02-21 VITALS — BP 129/66 | HR 68 | Resp 16 | Ht 59.5 in | Wt 111.0 lb

## 2014-02-21 DIAGNOSIS — I714 Abdominal aortic aneurysm, without rupture, unspecified: Secondary | ICD-10-CM | POA: Insufficient documentation

## 2014-02-21 DIAGNOSIS — Z48812 Encounter for surgical aftercare following surgery on the circulatory system: Secondary | ICD-10-CM | POA: Insufficient documentation

## 2014-02-21 NOTE — Progress Notes (Signed)
[facility]  Established Abdominal Aortic Aneurysm  History of Present Illness  [name] is a 80 y.o. ([birth year]) female patient of Dr. Donnetta Hutching who presents with chief complaint: follow up of infrarenal abdominal aortic aneurysm.  Previous studies demonstrate an AAA, measuring 5.3 cm.  The patient does not have back or abdominal pain.  The patient is not a smoker. Dr. Luther Parody last progress note indicates that she is not a stent graft candidate due to angulation in close proximity to the renal arteries; at that time Dr. Donnetta Hutching had a long discussion with the patient and her caregiver present and also discussed this with her brother who is a retired Scientific laboratory technician in [place]. He incidentally had open aneurysm repair for an aneurysm as well. Dr. Donnetta Hutching did not think she would tolerate the magnitude of open aneurysm repair especially in light of her most recent surgery for bowel obstruction. She is not a stent graft candidate. Dr. Donnetta Hutching indicated that her annual risk for rupture is approximately10%, her operative mortality would be much greater than this.  She has a case Freight forwarder with her re her memory issues. Patient fell January 29, 2014, case worker states pt was medically evaluated for this.  The patient denies claudication in legs with walking, denies non healing wounds.  Pt Diabetic: No Pt smoker: non-smoker  Past Medical History  Diagnosis Date  . Aortic valve regurgitation   . Hyperlipidemia   . Mitral valve disorder   . Pacemaker     For complete heart block, s/p generator change 09/2010   . Dyspnea   . Anxiety   . Depression   . Aneurysm of abdominal aorta     4.4 cm in 09/2010  . Closed intertrochanteric fracture of right hip 07/13/2011  . CHF (congestive heart failure)   . Left bundle branch block     dr Oran Rein  . Peri-prosthetic supracondylar fracture of femur 06/08/2012  . Coronary artery disease   . Myocardial infarction   . Fall at home  January 29, 2014   Past Surgical History  Procedure Laterality Date  . Replacement total knee  2008    left by Dr. Earvin Hansen  . Pacemaker insertion  2006    complete heart block  . Insert / replace / remove pacemaker  2012    upgrade to biventricular  . Femur im nail  07/14/2011    Procedure: INTRAMEDULLARY (IM) NAIL FEMORAL;  Surgeon: Johnny Bridge;  Location: [facility];  Service: Orthopedics;  Laterality: Right;  Synthes TFN, fracture table, big C arm, available after 4p  . Left ingunial hernia    . Laparotomy  11/11/2011    Procedure: EXPLORATORY LAPAROTOMY;  Surgeon: Harl Bowie, MD;  Location: [facility];  Service: General;  Laterality: N/A;  . Bowel resection  11/11/2011    Procedure: SMALL BOWEL RESECTION;  Surgeon: Harl Bowie, MD;  Location: [facility];  Service: General;  Laterality: N/A;  . Inguinal hernia repair  11/11/2011    Procedure: HERNIA REPAIR INGUINAL ADULT;  Surgeon: Harl Bowie, MD;  Location: [place] [facility];  Service: General;  Laterality: Left;  . Abdominal aortic aneurysm repair    . Fell    . Hernia repair    . Orif tibia plateau  06/08/2012    Procedure: OPEN REDUCTION INTERNAL FIXATION (ORIF) TIBIAL PLATEAU;  Surgeon: Johnny Bridge, MD;  Location: [facility];  Service: Orthopedics;  Laterality: Right;  ORIF of a Right Supracondylar Femur Fracture  .  Joint replacement Left     Knee   Social History History   Social History  . Marital Status: Married    Spouse Name: N/A    Number of Children: N/A  . Years of Education: N/A   Occupational History  . Not on file.   Social History Main Topics  . Smoking status: Never Smoker   . Smokeless tobacco: Never Used  . Alcohol Use: No  . Drug Use: No  . Sexual Activity: Not on file   Other Topics Concern  . Not on file   Social History Narrative   Regular exercise- yes. Retired and married.   Husband has significant disabilities and needs   Family History Family History  Problem Relation Age of Onset  . Other  Brother     AAA    Current Outpatient Prescriptions on File Prior to Visit  Medication Sig Dispense Refill  . acetaminophen (TYLENOL) 500 MG tablet Take 2 tablets (1,000 mg total) by mouth every 6 (six) hours as needed. For arthritis pain  30 tablet  2  . aspirin 81 MG tablet Take 81 mg by mouth daily.        . carvedilol (COREG) 6.25 MG tablet Take 6.25 mg by mouth 2 (two) times daily.        . cholecalciferol (VITAMIN D) 1000 UNITS tablet Take 1,000 Units by mouth daily.        . furosemide (LASIX) 20 MG tablet Take 20 mg by mouth 2 (two) times daily.       . Glucosamine 500 MG CAPS Take 1,500 mg by mouth daily.       . Lactobacillus (ACIDOPHILUS PO) Take 1 tablet by mouth daily.      Marland Kitchen losartan (COZAAR) 50 MG tablet Take 25 mg by mouth daily.       . Lutein 20 MG CAPS Take 20 mg by mouth daily.       . mirabegron ER (MYRBETRIQ) 50 MG TB24 tablet Take 50 mg by mouth daily.      . Multiple Vitamins-Minerals (MULTIVITAL) tablet Take 1 tablet by mouth daily.       . simvastatin (ZOCOR) 20 MG tablet Take 20 mg by mouth daily.       Marland Kitchen HYDROcodone-acetaminophen (NORCO) 5-325 MG per tablet Take 1-2 tablets by mouth every 6 (six) hours as needed for pain. MAXIMUM TOTAL ACETAMINOPHEN DOSE IS 4000 MG PER DAY  30 tablet  0  . oxybutynin (DITROPAN-XL) 10 MG 24 hr tablet Take 10 mg by mouth every other day.       . rivaroxaban (XARELTO) 10 MG TABS tablet Take 1 tablet (10 mg total) by mouth daily.  15 tablet  0  . rivaroxaban (XARELTO) 10 MG TABS tablet Take 10 mg by mouth daily.      . rivaroxaban (XARELTO) 10 MG TABS tablet Take 1 tablet (10 mg total) by mouth daily.  30 tablet  0  . sennosides-docusate sodium (SENOKOT-S) 8.6-50 MG tablet Take 1 tablet by mouth daily.  30 tablet  1  . [DISCONTINUED] calcium-vitamin D (OSCAL WITH D) 500-200 MG-UNIT per tablet Take 1 tablet by mouth daily.  100 tablet  2  . [DISCONTINUED] omeprazole (PRILOSEC) 20 MG capsule Take 20 mg by mouth daily.         No  current facility-administered medications on file prior to visit.   Allergies  Allergen Reactions  . Ace Inhibitors Other (See Comments)    unknown  . Atorvastatin Other (  See Comments)    unknown  . Other     " [facility]" caretaker says it can be "disastrous."    ROS: See HPI for pertinent positives and negatives.  Physical Examination  Filed Vitals:   02/21/14 1322  BP: 129/66  Pulse: 68  Resp: 16  Height: 4' 11.5" (1.511 m)  Weight: 111 lb (50.349 kg)  SpO2: 98%   Body mass index is 22.05 kg/(m^2).  General: A&O x 3, WD.  Pulmonary: Sym exp, good air movt, CTAB, no rales, rhonchi, or wheezing.  Cardiac: RRR, Nl S1, S2, no detected murmur.   Carotid Bruits Left Right   Negative Negative   Aorta is not palpable Radial pulses are 1+ and =                          VASCULAR EXAM:                                                                                                         LE Pulses LEFT RIGHT       FEMORAL   palpable   palpable        POPLITEAL   palpable   not palpable       POSTERIOR TIBIAL  not palpable   not palpable        DORSALIS PEDIS      ANTERIOR TIBIAL  palpable   palpable     Gastrointestinal: soft, NTND, -G/R, - HSM, - masses, - CVAT B.  Musculoskeletal: M/S 3/5 throughout, Extremities without ischemic changes.  Neurologic: CN 2-12 intact Pain and light touch intact in extremities are intact, Motor exam as listed above.  Non-Invasive Vascular Imaging  AAA Duplex (02/21/2014) ABDOMINAL AORTA DUPLEX EVALUATION    INDICATION: Evaluation of known abdominal aortic aneurysm    PREVIOUS INTERVENTION(S):     DUPLEX EXAM:     LOCATION DIAMETER AP (cm) DIAMETER TRANSVERSE (cm) VELOCITIES (cm/sec)  Aorta Proximal 2.56 2.34 125  Aorta Mid 2.78 2.68 86  Aorta Distal 5.3/2.46 5.52/2.24 92  Right Common Iliac Artery 1.3 1.26 77  Left Common Iliac Artery 1.57 1.28 72    Previous max aortic diameter:  4.8 cm AP x 5.3 cm  transverse Date: 02-08-2013     ADDITIONAL FINDINGS:     IMPRESSION: 1. Abdominal aortic aneurysm measuring 5.3cm AP x 5.52 cm transverse.  2. Atherosclerosis of the proximal to mid abdominal aorta and the common iliac artery bilaterally     Compared to the previous exam:  Slight interval increase in the size of the abdominal aortic aneurysm.       Medical Decision Making  The patient is a 80 y.o. female who presents with asymptomatic AAA with slight interval increase in size.   Based on this patient's exam and diagnostic studies, and after discussing with Dr. Donnetta Hutching, the patient has elected not to return for any further surveillance given her age and the slow growth of the AAA over time.  I emphasized the importance of maximal medical  management including strict control of blood pressure, blood glucose, and lipid levels, antiplatelet agents, obtaining regular exercise, and continued  cessation of smoking.   The patient was given information about AAA including signs, symptoms, treatment, and how to minimize the risk of enlargement and rupture of aneurysms.    The patient was advised to call 911 should the patient experience sudden onset abdominal or back pain.   Thank you for allowing [place] to participate in this patient's care.  Clemon Chambers, RN, MSN, FNP-C [facility] Office: [phone]  Clinic Physician: Early  02/21/2014, 1:44 PM

## 2014-02-21 NOTE — Patient Instructions (Signed)
Abdominal Aortic Aneurysm An aneurysm is a weakened or damaged part of an artery wall that bulges from the normal force of blood pumping through the body. An abdominal aortic aneurysm is an aneurysm that occurs in the lower part of the aorta, the main artery of the body.  The major concern with an abdominal aortic aneurysm is that it can enlarge and burst (rupture) or blood can flow between the layers of the wall of the aorta through a tear (aorticdissection). Both of these conditions can cause bleeding inside the body and can be life threatening unless diagnosed and treated promptly. CAUSES  The exact cause of an abdominal aortic aneurysm is unknown. Some contributing factors are:   A hardening of the arteries caused by the buildup of fat and other substances in the lining of a blood vessel (arteriosclerosis).  Inflammation of the walls of an artery (arteritis).   Connective tissue diseases, such as Marfan syndrome.   Abdominal trauma.   An infection, such as syphilis or staphylococcus, in the wall of the aorta (infectious aortitis) caused by bacteria. RISK FACTORS  Risk factors that contribute to an abdominal aortic aneurysm may include:  Age older than 60 years.   High blood pressure (hypertension).  Female gender.  Ethnicity (white race).  Obesity.  Family history of aneurysm (first degree relatives only).  Tobacco use. PREVENTION  The following healthy lifestyle habits may help decrease your risk of abdominal aortic aneurysm:  Quitting smoking. Smoking can raise your blood pressure and cause arteriosclerosis.  Limiting or avoiding alcohol.  Keeping your blood pressure, blood sugar level, and cholesterol levels within normal limits.  Decreasing your salt intake. In somepeople, too much salt can raise blood pressure and increase your risk of abdominal aortic aneurysm.  Eating a diet low in saturated fats and cholesterol.  Increasing your fiber intake by including  whole grains, vegetables, and fruits in your diet. Eating these foods may help lower blood pressure.  Maintaining a healthy weight.  Staying physically active and exercising regularly. SYMPTOMS  The symptoms of abdominal aortic aneurysm may vary depending on the size and rate of growth of the aneurysm.Most grow slowly and do not have any symptoms. When symptoms do occur, they may include:  Pain (abdomen, side, lower back, or groin). The pain may vary in intensity. A sudden onset of severe pain may indicate that the aneurysm has ruptured.  Feeling full after eating only small amounts of food.  Nausea or vomiting or both.  Feeling a pulsating lump in the abdomen.  Feeling faint or passing out. DIAGNOSIS  Since most unruptured abdominal aortic aneurysms have no symptoms, they are often discovered during diagnostic exams for other conditions. An aneurysm may be found during the following procedures:  Ultrasonography (A one-time screening for abdominal aortic aneurysm by ultrasonography is also recommended for all men aged 65-75 years who have ever smoked).  X-ray exams.  A computed tomography (CT).  Magnetic resonance imaging (MRI).  Angiography or arteriography. TREATMENT  Treatment of an abdominal aortic aneurysm depends on the size of your aneurysm, your age, and risk factors for rupture. Medication to control blood pressure and pain may be used to manage aneurysms smaller than 6 cm. Regular monitoring for enlargement may be recommended by your caregiver if:  The aneurysm is 3-4 cm in size (an annual ultrasonography may be recommended).  The aneurysm is 4-4.5 cm in size (an ultrasonography every 6 months may be recommended).  The aneurysm is larger than 4.5 cm in   size (your caregiver may ask that you be examined by a vascular surgeon). If your aneurysm is larger than 6 cm, surgical repair may be recommended. There are two main methods for repair of an aneurysm:   Endovascular  repair (a minimally invasive surgery). This is done most often.  Open repair. This method is used if an endovascular repair is not possible. Document Released: 05/21/2005 Document Revised: 12/06/2012 Document Reviewed: 09/10/2012 [employer] Patient Information 2015 [employer]. This information is not intended to replace advice given to you by your health care provider. Make sure you discuss any questions you have with your health care provider.  

## 2014-03-14 DIAGNOSIS — H6120 Impacted cerumen, unspecified ear: Secondary | ICD-10-CM | POA: Diagnosis not present

## 2014-04-17 ENCOUNTER — Other Ambulatory Visit: Payer: Self-pay

## 2014-04-17 DIAGNOSIS — Z1231 Encounter for screening mammogram for malignant neoplasm of breast: Secondary | ICD-10-CM

## 2014-04-18 DIAGNOSIS — N183 Chronic kidney disease, stage 3 unspecified: Secondary | ICD-10-CM | POA: Diagnosis not present

## 2014-04-18 DIAGNOSIS — D649 Anemia, unspecified: Secondary | ICD-10-CM | POA: Diagnosis not present

## 2014-04-18 DIAGNOSIS — E782 Mixed hyperlipidemia: Secondary | ICD-10-CM | POA: Diagnosis not present

## 2014-04-18 DIAGNOSIS — Z0000 Encounter for general adult medical examination without abnormal findings: Secondary | ICD-10-CM | POA: Diagnosis not present

## 2014-04-18 DIAGNOSIS — Z1331 Encounter for screening for depression: Secondary | ICD-10-CM | POA: Diagnosis not present

## 2014-04-18 DIAGNOSIS — N3942 Incontinence without sensory awareness: Secondary | ICD-10-CM | POA: Diagnosis not present

## 2014-04-18 DIAGNOSIS — K219 Gastro-esophageal reflux disease without esophagitis: Secondary | ICD-10-CM | POA: Diagnosis not present

## 2014-04-18 DIAGNOSIS — I714 Abdominal aortic aneurysm, without rupture, unspecified: Secondary | ICD-10-CM | POA: Diagnosis not present

## 2014-04-18 DIAGNOSIS — Z23 Encounter for immunization: Secondary | ICD-10-CM | POA: Diagnosis not present

## 2014-04-18 DIAGNOSIS — I10 Essential (primary) hypertension: Secondary | ICD-10-CM | POA: Diagnosis not present

## 2014-04-25 DIAGNOSIS — H16229 Keratoconjunctivitis sicca, not specified as Sjogren's, unspecified eye: Secondary | ICD-10-CM | POA: Diagnosis not present

## 2014-04-27 ENCOUNTER — Ambulatory Visit
Admission: RE | Admit: 2014-04-27 | Discharge: 2014-04-27 | Disposition: A | Discharge status: Home / Self Care | Payer: Medicare Other | Source: Ambulatory Visit

## 2014-04-27 DIAGNOSIS — Z1231 Encounter for screening mammogram for malignant neoplasm of breast: Secondary | ICD-10-CM | POA: Diagnosis not present

## 2014-04-28 DIAGNOSIS — I059 Rheumatic mitral valve disease, unspecified: Secondary | ICD-10-CM | POA: Diagnosis not present

## 2014-04-28 DIAGNOSIS — I359 Nonrheumatic aortic valve disorder, unspecified: Secondary | ICD-10-CM | POA: Diagnosis not present

## 2014-04-28 DIAGNOSIS — I714 Abdominal aortic aneurysm, without rupture, unspecified: Secondary | ICD-10-CM | POA: Diagnosis not present

## 2014-04-28 DIAGNOSIS — Z950 Presence of cardiac pacemaker: Secondary | ICD-10-CM | POA: Diagnosis not present

## 2014-04-28 DIAGNOSIS — E785 Hyperlipidemia, unspecified: Secondary | ICD-10-CM | POA: Diagnosis not present

## 2014-04-28 DIAGNOSIS — I447 Left bundle-branch block, unspecified: Secondary | ICD-10-CM | POA: Diagnosis not present

## 2014-04-28 DIAGNOSIS — I5022 Chronic systolic (congestive) heart failure: Secondary | ICD-10-CM | POA: Diagnosis not present

## 2014-06-06 DIAGNOSIS — Z111 Encounter for screening for respiratory tuberculosis: Secondary | ICD-10-CM | POA: Diagnosis not present

## 2014-06-13 DIAGNOSIS — F981 Encopresis not due to a substance or known physiological condition: Secondary | ICD-10-CM | POA: Diagnosis not present

## 2014-06-13 DIAGNOSIS — N3281 Overactive bladder: Secondary | ICD-10-CM | POA: Diagnosis not present

## 2014-08-01 DIAGNOSIS — M6281 Muscle weakness (generalized): Secondary | ICD-10-CM | POA: Diagnosis not present

## 2014-08-01 DIAGNOSIS — R279 Unspecified lack of coordination: Secondary | ICD-10-CM | POA: Diagnosis not present

## 2014-08-02 DIAGNOSIS — I349 Nonrheumatic mitral valve disorder, unspecified: Secondary | ICD-10-CM | POA: Diagnosis not present

## 2014-08-02 DIAGNOSIS — I714 Abdominal aortic aneurysm, without rupture: Secondary | ICD-10-CM | POA: Diagnosis not present

## 2014-08-02 DIAGNOSIS — I351 Nonrheumatic aortic (valve) insufficiency: Secondary | ICD-10-CM | POA: Diagnosis not present

## 2014-08-02 DIAGNOSIS — I5022 Chronic systolic (congestive) heart failure: Secondary | ICD-10-CM | POA: Diagnosis not present

## 2014-08-02 DIAGNOSIS — E785 Hyperlipidemia, unspecified: Secondary | ICD-10-CM | POA: Diagnosis not present

## 2014-08-02 DIAGNOSIS — Z950 Presence of cardiac pacemaker: Secondary | ICD-10-CM | POA: Diagnosis not present

## 2014-08-02 DIAGNOSIS — I447 Left bundle-branch block, unspecified: Secondary | ICD-10-CM | POA: Diagnosis not present

## 2014-08-03 DIAGNOSIS — R279 Unspecified lack of coordination: Secondary | ICD-10-CM | POA: Diagnosis not present

## 2014-08-03 DIAGNOSIS — M6281 Muscle weakness (generalized): Secondary | ICD-10-CM | POA: Diagnosis not present

## 2014-08-04 DIAGNOSIS — R279 Unspecified lack of coordination: Secondary | ICD-10-CM | POA: Diagnosis not present

## 2014-08-04 DIAGNOSIS — M6281 Muscle weakness (generalized): Secondary | ICD-10-CM | POA: Diagnosis not present

## 2014-08-07 DIAGNOSIS — R279 Unspecified lack of coordination: Secondary | ICD-10-CM | POA: Diagnosis not present

## 2014-08-07 DIAGNOSIS — M6281 Muscle weakness (generalized): Secondary | ICD-10-CM | POA: Diagnosis not present

## 2014-08-09 DIAGNOSIS — M6281 Muscle weakness (generalized): Secondary | ICD-10-CM | POA: Diagnosis not present

## 2014-08-09 DIAGNOSIS — R279 Unspecified lack of coordination: Secondary | ICD-10-CM | POA: Diagnosis not present

## 2014-08-11 DIAGNOSIS — R279 Unspecified lack of coordination: Secondary | ICD-10-CM | POA: Diagnosis not present

## 2014-08-11 DIAGNOSIS — M6281 Muscle weakness (generalized): Secondary | ICD-10-CM | POA: Diagnosis not present

## 2014-08-29 DIAGNOSIS — I10 Essential (primary) hypertension: Secondary | ICD-10-CM | POA: Diagnosis not present

## 2014-08-29 DIAGNOSIS — E782 Mixed hyperlipidemia: Secondary | ICD-10-CM | POA: Diagnosis not present

## 2014-08-29 DIAGNOSIS — I714 Abdominal aortic aneurysm, without rupture: Secondary | ICD-10-CM | POA: Diagnosis not present

## 2014-08-29 DIAGNOSIS — K4090 Unilateral inguinal hernia, without obstruction or gangrene, not specified as recurrent: Secondary | ICD-10-CM | POA: Diagnosis not present

## 2014-08-29 DIAGNOSIS — K439 Ventral hernia without obstruction or gangrene: Secondary | ICD-10-CM | POA: Diagnosis not present

## 2014-08-29 DIAGNOSIS — N183 Chronic kidney disease, stage 3 (moderate): Secondary | ICD-10-CM | POA: Diagnosis not present

## 2014-08-29 DIAGNOSIS — N3942 Incontinence without sensory awareness: Secondary | ICD-10-CM | POA: Diagnosis not present

## 2014-10-26 DIAGNOSIS — I447 Left bundle-branch block, unspecified: Secondary | ICD-10-CM | POA: Diagnosis not present

## 2014-10-26 DIAGNOSIS — I349 Nonrheumatic mitral valve disorder, unspecified: Secondary | ICD-10-CM | POA: Diagnosis not present

## 2014-10-26 DIAGNOSIS — E785 Hyperlipidemia, unspecified: Secondary | ICD-10-CM | POA: Diagnosis not present

## 2014-10-26 DIAGNOSIS — I5022 Chronic systolic (congestive) heart failure: Secondary | ICD-10-CM | POA: Diagnosis not present

## 2014-10-26 DIAGNOSIS — I714 Abdominal aortic aneurysm, without rupture: Secondary | ICD-10-CM | POA: Diagnosis not present

## 2014-10-26 DIAGNOSIS — I351 Nonrheumatic aortic (valve) insufficiency: Secondary | ICD-10-CM | POA: Diagnosis not present

## 2014-10-26 DIAGNOSIS — Z950 Presence of cardiac pacemaker: Secondary | ICD-10-CM | POA: Diagnosis not present

## 2014-11-01 DIAGNOSIS — I349 Nonrheumatic mitral valve disorder, unspecified: Secondary | ICD-10-CM | POA: Diagnosis not present

## 2014-11-01 DIAGNOSIS — I5022 Chronic systolic (congestive) heart failure: Secondary | ICD-10-CM | POA: Diagnosis not present

## 2014-11-01 DIAGNOSIS — I351 Nonrheumatic aortic (valve) insufficiency: Secondary | ICD-10-CM | POA: Diagnosis not present

## 2014-11-01 DIAGNOSIS — Z950 Presence of cardiac pacemaker: Secondary | ICD-10-CM | POA: Diagnosis not present

## 2014-11-01 DIAGNOSIS — E785 Hyperlipidemia, unspecified: Secondary | ICD-10-CM | POA: Diagnosis not present

## 2014-11-01 DIAGNOSIS — I447 Left bundle-branch block, unspecified: Secondary | ICD-10-CM | POA: Diagnosis not present

## 2014-11-01 DIAGNOSIS — I714 Abdominal aortic aneurysm, without rupture: Secondary | ICD-10-CM | POA: Diagnosis not present

## 2014-11-28 DIAGNOSIS — E782 Mixed hyperlipidemia: Secondary | ICD-10-CM | POA: Diagnosis not present

## 2014-11-28 DIAGNOSIS — I714 Abdominal aortic aneurysm, without rupture: Secondary | ICD-10-CM | POA: Diagnosis not present

## 2014-11-28 DIAGNOSIS — I5022 Chronic systolic (congestive) heart failure: Secondary | ICD-10-CM | POA: Diagnosis not present

## 2014-11-28 DIAGNOSIS — K219 Gastro-esophageal reflux disease without esophagitis: Secondary | ICD-10-CM | POA: Diagnosis not present

## 2014-11-28 DIAGNOSIS — M25511 Pain in right shoulder: Secondary | ICD-10-CM | POA: Diagnosis not present

## 2014-11-28 DIAGNOSIS — Z1389 Encounter for screening for other disorder: Secondary | ICD-10-CM | POA: Diagnosis not present

## 2014-11-28 DIAGNOSIS — F419 Anxiety disorder, unspecified: Secondary | ICD-10-CM | POA: Diagnosis not present

## 2014-11-28 DIAGNOSIS — I10 Essential (primary) hypertension: Secondary | ICD-10-CM | POA: Diagnosis not present

## 2014-12-31 DIAGNOSIS — N390 Urinary tract infection, site not specified: Secondary | ICD-10-CM | POA: Diagnosis not present

## 2015-01-04 ENCOUNTER — Other Ambulatory Visit: Payer: Self-pay | Admitting: Dermatology

## 2015-01-04 DIAGNOSIS — C44329 Squamous cell carcinoma of skin of other parts of face: Secondary | ICD-10-CM | POA: Diagnosis not present

## 2015-01-04 DIAGNOSIS — L821 Other seborrheic keratosis: Secondary | ICD-10-CM | POA: Diagnosis not present

## 2015-01-12 DIAGNOSIS — N390 Urinary tract infection, site not specified: Secondary | ICD-10-CM | POA: Diagnosis not present

## 2015-02-19 ENCOUNTER — Other Ambulatory Visit: Payer: Self-pay

## 2015-03-06 DIAGNOSIS — L821 Other seborrheic keratosis: Secondary | ICD-10-CM | POA: Diagnosis not present

## 2015-03-06 DIAGNOSIS — Z08 Encounter for follow-up examination after completed treatment for malignant neoplasm: Secondary | ICD-10-CM | POA: Diagnosis not present

## 2015-03-06 DIAGNOSIS — Z85828 Personal history of other malignant neoplasm of skin: Secondary | ICD-10-CM | POA: Diagnosis not present

## 2015-03-06 DIAGNOSIS — L570 Actinic keratosis: Secondary | ICD-10-CM | POA: Diagnosis not present

## 2015-03-28 DIAGNOSIS — F419 Anxiety disorder, unspecified: Secondary | ICD-10-CM | POA: Diagnosis not present

## 2015-03-29 ENCOUNTER — Other Ambulatory Visit: Payer: Self-pay

## 2015-03-29 DIAGNOSIS — Z1231 Encounter for screening mammogram for malignant neoplasm of breast: Secondary | ICD-10-CM

## 2015-04-02 DIAGNOSIS — N183 Chronic kidney disease, stage 3 (moderate): Secondary | ICD-10-CM | POA: Diagnosis not present

## 2015-04-02 DIAGNOSIS — R7309 Other abnormal glucose: Secondary | ICD-10-CM | POA: Diagnosis not present

## 2015-04-02 DIAGNOSIS — I5022 Chronic systolic (congestive) heart failure: Secondary | ICD-10-CM | POA: Diagnosis not present

## 2015-04-02 DIAGNOSIS — I10 Essential (primary) hypertension: Secondary | ICD-10-CM | POA: Diagnosis not present

## 2015-04-02 DIAGNOSIS — Z1389 Encounter for screening for other disorder: Secondary | ICD-10-CM | POA: Diagnosis not present

## 2015-04-02 DIAGNOSIS — E782 Mixed hyperlipidemia: Secondary | ICD-10-CM | POA: Diagnosis not present

## 2015-04-02 DIAGNOSIS — Z0000 Encounter for general adult medical examination without abnormal findings: Secondary | ICD-10-CM | POA: Diagnosis not present

## 2015-04-02 DIAGNOSIS — I714 Abdominal aortic aneurysm, without rupture: Secondary | ICD-10-CM | POA: Diagnosis not present

## 2015-04-02 DIAGNOSIS — Z950 Presence of cardiac pacemaker: Secondary | ICD-10-CM | POA: Diagnosis not present

## 2015-04-02 DIAGNOSIS — N3942 Incontinence without sensory awareness: Secondary | ICD-10-CM | POA: Diagnosis not present

## 2015-04-02 DIAGNOSIS — K219 Gastro-esophageal reflux disease without esophagitis: Secondary | ICD-10-CM | POA: Diagnosis not present

## 2015-04-05 DIAGNOSIS — I5022 Chronic systolic (congestive) heart failure: Secondary | ICD-10-CM | POA: Diagnosis not present

## 2015-04-05 DIAGNOSIS — I351 Nonrheumatic aortic (valve) insufficiency: Secondary | ICD-10-CM | POA: Diagnosis not present

## 2015-04-05 DIAGNOSIS — I714 Abdominal aortic aneurysm, without rupture: Secondary | ICD-10-CM | POA: Diagnosis not present

## 2015-04-05 DIAGNOSIS — E785 Hyperlipidemia, unspecified: Secondary | ICD-10-CM | POA: Diagnosis not present

## 2015-04-05 DIAGNOSIS — I349 Nonrheumatic mitral valve disorder, unspecified: Secondary | ICD-10-CM | POA: Diagnosis not present

## 2015-04-05 DIAGNOSIS — Z950 Presence of cardiac pacemaker: Secondary | ICD-10-CM | POA: Diagnosis not present

## 2015-04-05 DIAGNOSIS — I447 Left bundle-branch block, unspecified: Secondary | ICD-10-CM | POA: Diagnosis not present

## 2015-04-10 ENCOUNTER — Other Ambulatory Visit: Payer: Self-pay | Admitting: Internal Medicine

## 2015-04-10 DIAGNOSIS — I714 Abdominal aortic aneurysm, without rupture, unspecified: Secondary | ICD-10-CM

## 2015-04-11 DIAGNOSIS — H6123 Impacted cerumen, bilateral: Secondary | ICD-10-CM | POA: Diagnosis not present

## 2015-04-13 ENCOUNTER — Other Ambulatory Visit: Payer: Medicare Other

## 2015-04-13 DIAGNOSIS — N3281 Overactive bladder: Secondary | ICD-10-CM | POA: Diagnosis not present

## 2015-04-17 ENCOUNTER — Ambulatory Visit
Admission: RE | Admit: 2015-04-17 | Discharge: 2015-04-17 | Disposition: A | Discharge status: Home / Self Care | Payer: Medicare Other | Source: Ambulatory Visit | Attending: Internal Medicine | Admitting: Internal Medicine

## 2015-04-17 DIAGNOSIS — I714 Abdominal aortic aneurysm, without rupture, unspecified: Secondary | ICD-10-CM

## 2015-04-23 ENCOUNTER — Encounter: Payer: Self-pay | Admitting: Family

## 2015-04-24 ENCOUNTER — Encounter: Payer: Self-pay | Admitting: Family

## 2015-04-24 ENCOUNTER — Ambulatory Visit (INDEPENDENT_AMBULATORY_CARE_PROVIDER_SITE_OTHER): Payer: Medicare Other | Admitting: Family

## 2015-04-24 VITALS — BP 122/60 | HR 72 | Temp 97.2°F | Resp 14 | Ht 60.0 in | Wt 111.8 lb

## 2015-04-24 DIAGNOSIS — I714 Abdominal aortic aneurysm, without rupture, unspecified: Secondary | ICD-10-CM

## 2015-04-24 NOTE — Progress Notes (Signed)
[facility]  Established Abdominal Aortic Aneurysm  History of Present Illness  [name] is a 81 y.o. ([birth year]) female patient of Dr. Donnetta Hutching who presents with chief complaint: follow up of infrarenal abdominal aortic aneurysm. Previous studies demonstrate an AAA, measuring 5.3 cm. The patient does not have back or abdominal pain. The patient is not a smoker. Dr. Luther Parody last progress note indicates that she is not a stent graft candidate due to angulation in close proximity to the renal arteries; at that time Dr. Donnetta Hutching had a long discussion with the patient and her caregiver present and also discussed this with her brother who is a retired Scientific laboratory technician in [place]. He incidentally had open aneurysm repair for an aneurysm as well. Dr. Donnetta Hutching did not think she would tolerate the magnitude of open aneurysm repair especially in light of her most recent surgery for bowel obstruction. She is not a stent graft candidate. Dr. Donnetta Hutching indicated that her annual risk for rupture is approximately 5%, her operative mortality would be much greater than this. Pt is moving to [place] soon and Dr. Deforest Hoyles wants her to be evaluated for her AAA before she moves. Pt comes with an abdominal ultrasound that was done 04/17/15 that indicates the largest dimension is 5.8 cm.   She has a case Freight forwarder with her re her memory issues. Patient fell January 29, 2014, case worker states pt was medically evaluated for this.  The patient denies claudication in legs with walking, denies non healing wounds.  Pt Diabetic: No Pt smoker: non-smoker  Past Medical History  Diagnosis Date  . Aortic valve regurgitation   . Hyperlipidemia   . Mitral valve disorder   . Pacemaker     For complete heart block, s/p generator change 09/2010   . Dyspnea   . Anxiety   . Depression   . Aneurysm of abdominal aorta     4.4 cm in 09/2010  . Closed intertrochanteric fracture of right hip 07/13/2011  .  CHF (congestive heart failure)   . Left bundle branch block     dr Oran Rein  . Peri-prosthetic supracondylar fracture of femur 06/08/2012  . Coronary artery disease   . Myocardial infarction   . Fall at home January 29, 2014   Past Surgical History  Procedure Laterality Date  . Replacement total knee  2008    left by Dr. Earvin Hansen  . Pacemaker insertion  2006    complete heart block  . Insert / replace / remove pacemaker  2012    upgrade to biventricular  . Femur im nail  07/14/2011    Procedure: INTRAMEDULLARY (IM) NAIL FEMORAL;  Surgeon: Johnny Bridge;  Location: [facility];  Service: Orthopedics;  Laterality: Right;  Synthes TFN, fracture table, big C arm, available after 4p  . Left ingunial hernia    . Laparotomy  11/11/2011    Procedure: EXPLORATORY LAPAROTOMY;  Surgeon: Harl Bowie, MD;  Location: [facility];  Service: General;  Laterality: N/A;  . Bowel resection  11/11/2011    Procedure: SMALL BOWEL RESECTION;  Surgeon: Harl Bowie, MD;  Location: [facility];  Service: General;  Laterality: N/A;  . Inguinal hernia repair  11/11/2011    Procedure: HERNIA REPAIR INGUINAL ADULT;  Surgeon: Harl Bowie, MD;  Location: [facility];  Service: General;  Laterality: Left;  . Abdominal aortic aneurysm repair    . Fell    . Hernia repair    . Orif tibia plateau  06/08/2012    Procedure: OPEN REDUCTION INTERNAL FIXATION (ORIF) TIBIAL PLATEAU;  Surgeon: Johnny Bridge, MD;  Location: [facility];  Service: Orthopedics;  Laterality: Right;  ORIF of a Right Supracondylar Femur Fracture  . Joint replacement Left     Knee   Social History Social History   Social History  . Marital Status: Married    Spouse Name: N/A  . Number of Children: N/A  . Years of Education: N/A   Occupational History  . Not on file.   Social History Main Topics  . Smoking status: Never Smoker   . Smokeless tobacco: Never Used  . Alcohol Use: No  . Drug Use: No  . Sexual Activity: Not on file   Other Topics Concern   . Not on file   Social History Narrative   Regular exercise- yes. Retired and married.   Husband has significant disabilities and needs   Family History Family History  Problem Relation Age of Onset  . Other Brother     AAA    Current Outpatient Prescriptions on File Prior to Visit  Medication Sig Dispense Refill  . acetaminophen (TYLENOL) 500 MG tablet Take 2 tablets (1,000 mg total) by mouth every 6 (six) hours as needed. For arthritis pain 30 tablet 2  . aspirin 81 MG tablet Take 81 mg by mouth daily.      . calcium-vitamin D 250-100 MG-UNIT per tablet Take by mouth daily. 3400  Units daily    . carvedilol (COREG) 6.25 MG tablet Take 6.25 mg by mouth 2 (two) times daily.      . cholecalciferol (VITAMIN D) 1000 UNITS tablet Take 1,000 Units by mouth daily.      [place] Kitchen dicyclomine (BENTYL) 10 MG capsule Take 10 mg by mouth daily.    . furosemide (LASIX) 20 MG tablet Take 20 mg by mouth 2 (two) times daily.     . Glucosamine 500 MG CAPS Take 1,500 mg by mouth daily.     [place] Kitchen HYDROcodone-acetaminophen (NORCO) 5-325 MG per tablet Take 1-2 tablets by mouth every 6 (six) hours as needed for pain. MAXIMUM TOTAL ACETAMINOPHEN DOSE IS 4000 MG PER DAY 30 tablet 0  . Lactobacillus (ACIDOPHILUS PO) Take 1 tablet by mouth daily.    [place] Kitchen losartan (COZAAR) 50 MG tablet Take 25 mg by mouth daily.     . Lutein 20 MG CAPS Take 20 mg by mouth daily.     . mirabegron ER (MYRBETRIQ) 50 MG TB24 tablet Take 50 mg by mouth daily.    . Multiple Vitamins-Minerals (MULTIVITAL) tablet Take 1 tablet by mouth daily.     [place] Kitchen oxybutynin (DITROPAN-XL) 10 MG 24 hr tablet Take 10 mg by mouth every other day.     . rivaroxaban (XARELTO) 10 MG TABS tablet Take 10 mg by mouth daily.    . sennosides-docusate sodium (SENOKOT-S) 8.6-50 MG tablet Take 1 tablet by mouth daily. 30 tablet 1  . simvastatin (ZOCOR) 20 MG tablet Take 20 mg by mouth daily.     . rivaroxaban (XARELTO) 10 MG TABS tablet Take 1 tablet (10 mg total) by mouth  daily. 15 tablet 0  . rivaroxaban (XARELTO) 10 MG TABS tablet Take 1 tablet (10 mg total) by mouth daily. 30 tablet 0  . [DISCONTINUED] calcium-vitamin D (OSCAL WITH D) 500-200 MG-UNIT per tablet Take 1 tablet by mouth daily. 100 tablet 2  . [DISCONTINUED] omeprazole (PRILOSEC) 20 MG capsule Take 20 mg by mouth daily.  No current facility-administered medications on file prior to visit.   Allergies  Allergen Reactions  . Ace Inhibitors Other (See Comments)    unknown  . Atorvastatin Other (See Comments)    unknown  . Other     " [facility]" caretaker says it can be "disastrous."    ROS: See HPI for pertinent positives and negatives.  Physical Examination  Filed Vitals:   04/24/15 1313  BP: 122/60  Pulse: 72  Temp: 97.2 F (36.2 C)  TempSrc: Oral  Resp: 14  Height: 5' (1.524 m)  Weight: 111 lb 12.8 oz (50.712 kg)  SpO2: 97%   Body mass index is 21.83 kg/(m^2).   General: A&O x 3, WD.  Pulmonary: Sym exp, good air movt, CTAB, no rales, rhonchi, or wheezing.  Cardiac: RRR, Nl S1, S2, no detected murmur.   Carotid Bruits Left Right   Negative Negative  Aorta is not palpable Radial pulses are 1+ and =   VASCULAR EXAM:     LE Pulses LEFT RIGHT   FEMORAL  palpable  palpable    POPLITEAL  palpable  not palpable   POSTERIOR TIBIAL not palpable  not palpable    DORSALIS PEDIS  ANTERIOR TIBIAL 2-3+palpable  2-3+palpable     Gastrointestinal: soft, NTND, -G/R, - HSM, large nontender midline lower abdomen incisional hernia, - CVAT B.  Musculoskeletal: M/S 3/5 throughout, extremities without ischemic changes.  Neurologic: CN 2-12 intact except is hard of hearing. Pain and light touch intact in extremities are intact, Motor exam as listed above.         Non-Invasive Vascular Imaging  AAA Duplex (04/17/15, [facility])  Previous size: 5.3 cm (Date: 02/21/2014)  Current size:  5.8 cm (Date: 04/17/2015)  Medical Decision Making  The patient is a 81 y.o. female who presents with asymptomatic AAA with slight increase in size in 14 months.  Dr. Donnetta Hutching spoke with pt and also with pt's daughter by phone. It was agreed that pt would be followed by a board certified vascular surgeon in [place]. Pt's care manager with pt states pt is leaving for [place] in 3 days and will not be returning.     The patient was given information about AAA including signs, symptoms, treatment, and how to minimize the risk of enlargement and rupture of aneurysms.    The patient was advised to call 911 should the patient experience sudden onset abdominal or back pain.   Thank you for allowing [place] to participate in this patient's care.  Clemon Chambers, RN, MSN, FNP-C [facility] Office: [phone]  Clinic Physician: Early  04/24/2015, 1:08 PM

## 2015-04-24 NOTE — Patient Instructions (Signed)
Abdominal Aortic Aneurysm An aneurysm is a weakened or damaged part of an artery wall that bulges from the normal force of blood pumping through the body. An abdominal aortic aneurysm is an aneurysm that occurs in the lower part of the aorta, the main artery of the body.  The major concern with an abdominal aortic aneurysm is that it can enlarge and burst (rupture) or blood can flow between the layers of the wall of the aorta through a tear (aorticdissection). Both of these conditions can cause bleeding inside the body and can be life threatening unless diagnosed and treated promptly. CAUSES  The exact cause of an abdominal aortic aneurysm is unknown. Some contributing factors are:   A hardening of the arteries caused by the buildup of fat and other substances in the lining of a blood vessel (arteriosclerosis).  Inflammation of the walls of an artery (arteritis).   Connective tissue diseases, such as Marfan syndrome.   Abdominal trauma.   An infection, such as syphilis or staphylococcus, in the wall of the aorta (infectious aortitis) caused by bacteria. RISK FACTORS  Risk factors that contribute to an abdominal aortic aneurysm may include:  Age older than 60 years.   High blood pressure (hypertension).  Female gender.  Ethnicity (white race).  Obesity.  Family history of aneurysm (first degree relatives only).  Tobacco use. PREVENTION  The following healthy lifestyle habits may help decrease your risk of abdominal aortic aneurysm:  Quitting smoking. Smoking can raise your blood pressure and cause arteriosclerosis.  Limiting or avoiding alcohol.  Keeping your blood pressure, blood sugar level, and cholesterol levels within normal limits.  Decreasing your salt intake. In somepeople, too much salt can raise blood pressure and increase your risk of abdominal aortic aneurysm.  Eating a diet low in saturated fats and cholesterol.  Increasing your fiber intake by including  whole grains, vegetables, and fruits in your diet. Eating these foods may help lower blood pressure.  Maintaining a healthy weight.  Staying physically active and exercising regularly. SYMPTOMS  The symptoms of abdominal aortic aneurysm may vary depending on the size and rate of growth of the aneurysm.Most grow slowly and do not have any symptoms. When symptoms do occur, they may include:  Pain (abdomen, side, lower back, or groin). The pain may vary in intensity. A sudden onset of severe pain may indicate that the aneurysm has ruptured.  Feeling full after eating only small amounts of food.  Nausea or vomiting or both.  Feeling a pulsating lump in the abdomen.  Feeling faint or passing out. DIAGNOSIS  Since most unruptured abdominal aortic aneurysms have no symptoms, they are often discovered during diagnostic exams for other conditions. An aneurysm may be found during the following procedures:  Ultrasonography (A one-time screening for abdominal aortic aneurysm by ultrasonography is also recommended for all men aged 65-75 years who have ever smoked).  X-ray exams.  A computed tomography (CT).  Magnetic resonance imaging (MRI).  Angiography or arteriography. TREATMENT  Treatment of an abdominal aortic aneurysm depends on the size of your aneurysm, your age, and risk factors for rupture. Medication to control blood pressure and pain may be used to manage aneurysms smaller than 6 cm. Regular monitoring for enlargement may be recommended by your caregiver if:  The aneurysm is 3-4 cm in size (an annual ultrasonography may be recommended).  The aneurysm is 4-4.5 cm in size (an ultrasonography every 6 months may be recommended).  The aneurysm is larger than 4.5 cm in   size (your caregiver may ask that you be examined by a vascular surgeon). If your aneurysm is larger than 6 cm, surgical repair may be recommended. There are two main methods for repair of an aneurysm:   Endovascular  repair (a minimally invasive surgery). This is done most often.  Open repair. This method is used if an endovascular repair is not possible. Document Released: 05/21/2005 Document Revised: 12/06/2012 Document Reviewed: 09/10/2012 [employer] Patient Information 2015 [employer]. This information is not intended to replace advice given to you by your health care provider. Make sure you discuss any questions you have with your health care provider.  

## 2015-05-01 ENCOUNTER — Ambulatory Visit: Payer: Medicare Other

## 2015-05-08 DIAGNOSIS — T1511XA Foreign body in conjunctival sac, right eye, initial encounter: Secondary | ICD-10-CM | POA: Diagnosis not present

## 2015-05-08 DIAGNOSIS — H3530 Unspecified macular degeneration: Secondary | ICD-10-CM | POA: Diagnosis not present

## 2015-05-08 DIAGNOSIS — Z961 Presence of intraocular lens: Secondary | ICD-10-CM | POA: Diagnosis not present

## 2015-05-08 DIAGNOSIS — T1512XA Foreign body in conjunctival sac, left eye, initial encounter: Secondary | ICD-10-CM | POA: Diagnosis not present

## 2015-07-27 DIAGNOSIS — I5022 Chronic systolic (congestive) heart failure: Secondary | ICD-10-CM | POA: Diagnosis not present

## 2015-07-27 DIAGNOSIS — R4189 Other symptoms and signs involving cognitive functions and awareness: Secondary | ICD-10-CM | POA: Diagnosis not present

## 2015-07-27 DIAGNOSIS — Z950 Presence of cardiac pacemaker: Secondary | ICD-10-CM | POA: Diagnosis not present

## 2015-07-27 DIAGNOSIS — R269 Unspecified abnormalities of gait and mobility: Secondary | ICD-10-CM | POA: Diagnosis not present

## 2015-07-27 DIAGNOSIS — I447 Left bundle-branch block, unspecified: Secondary | ICD-10-CM | POA: Diagnosis not present

## 2015-07-27 DIAGNOSIS — M25511 Pain in right shoulder: Secondary | ICD-10-CM | POA: Diagnosis not present

## 2015-07-27 DIAGNOSIS — R32 Unspecified urinary incontinence: Secondary | ICD-10-CM | POA: Diagnosis not present

## 2015-07-27 DIAGNOSIS — Z1283 Encounter for screening for malignant neoplasm of skin: Secondary | ICD-10-CM | POA: Diagnosis not present

## 2015-07-27 DIAGNOSIS — E785 Hyperlipidemia, unspecified: Secondary | ICD-10-CM | POA: Diagnosis not present

## 2015-08-30 DIAGNOSIS — L84 Corns and callosities: Secondary | ICD-10-CM | POA: Diagnosis not present

## 2015-08-30 DIAGNOSIS — B351 Tinea unguium: Secondary | ICD-10-CM | POA: Diagnosis not present

## 2015-08-30 DIAGNOSIS — I70202 Unspecified atherosclerosis of native arteries of extremities, left leg: Secondary | ICD-10-CM | POA: Diagnosis not present

## 2015-08-30 DIAGNOSIS — I70201 Unspecified atherosclerosis of native arteries of extremities, right leg: Secondary | ICD-10-CM | POA: Diagnosis not present

## 2015-09-14 DIAGNOSIS — I5022 Chronic systolic (congestive) heart failure: Secondary | ICD-10-CM | POA: Diagnosis not present

## 2015-09-14 DIAGNOSIS — I719 Aortic aneurysm of unspecified site, without rupture: Secondary | ICD-10-CM | POA: Diagnosis not present

## 2015-09-14 DIAGNOSIS — I059 Rheumatic mitral valve disease, unspecified: Secondary | ICD-10-CM | POA: Diagnosis not present

## 2015-09-14 DIAGNOSIS — E7800 Pure hypercholesterolemia, unspecified: Secondary | ICD-10-CM | POA: Diagnosis not present

## 2015-09-14 DIAGNOSIS — Z950 Presence of cardiac pacemaker: Secondary | ICD-10-CM | POA: Diagnosis not present

## 2015-09-14 DIAGNOSIS — I359 Nonrheumatic aortic valve disorder, unspecified: Secondary | ICD-10-CM | POA: Diagnosis not present

## 2015-09-14 DIAGNOSIS — I447 Left bundle-branch block, unspecified: Secondary | ICD-10-CM | POA: Diagnosis not present

## 2015-09-15 DIAGNOSIS — N3001 Acute cystitis with hematuria: Secondary | ICD-10-CM | POA: Diagnosis not present

## 2015-09-16 DIAGNOSIS — N3001 Acute cystitis with hematuria: Secondary | ICD-10-CM | POA: Diagnosis not present

## 2015-09-25 DIAGNOSIS — Z950 Presence of cardiac pacemaker: Secondary | ICD-10-CM | POA: Diagnosis not present

## 2015-09-25 DIAGNOSIS — I5022 Chronic systolic (congestive) heart failure: Secondary | ICD-10-CM | POA: Diagnosis not present

## 2015-09-25 DIAGNOSIS — I459 Conduction disorder, unspecified: Secondary | ICD-10-CM | POA: Diagnosis not present

## 2015-09-28 DIAGNOSIS — I358 Other nonrheumatic aortic valve disorders: Secondary | ICD-10-CM | POA: Diagnosis not present

## 2015-09-28 DIAGNOSIS — I447 Left bundle-branch block, unspecified: Secondary | ICD-10-CM | POA: Diagnosis not present

## 2015-09-28 DIAGNOSIS — I719 Aortic aneurysm of unspecified site, without rupture: Secondary | ICD-10-CM | POA: Diagnosis not present

## 2015-09-28 DIAGNOSIS — I348 Other nonrheumatic mitral valve disorders: Secondary | ICD-10-CM | POA: Diagnosis not present

## 2015-09-28 DIAGNOSIS — I509 Heart failure, unspecified: Secondary | ICD-10-CM | POA: Diagnosis not present

## 2015-09-28 DIAGNOSIS — Z950 Presence of cardiac pacemaker: Secondary | ICD-10-CM | POA: Diagnosis not present

## 2015-09-28 DIAGNOSIS — I5022 Chronic systolic (congestive) heart failure: Secondary | ICD-10-CM | POA: Diagnosis not present

## 2015-10-05 DIAGNOSIS — R350 Frequency of micturition: Secondary | ICD-10-CM | POA: Diagnosis not present

## 2015-10-26 DIAGNOSIS — R32 Unspecified urinary incontinence: Secondary | ICD-10-CM | POA: Diagnosis not present

## 2015-10-26 DIAGNOSIS — R269 Unspecified abnormalities of gait and mobility: Secondary | ICD-10-CM | POA: Diagnosis not present

## 2015-10-26 DIAGNOSIS — Z2821 Immunization not carried out because of patient refusal: Secondary | ICD-10-CM | POA: Diagnosis not present

## 2015-10-26 DIAGNOSIS — M19211 Secondary osteoarthritis, right shoulder: Secondary | ICD-10-CM | POA: Diagnosis not present

## 2015-10-26 DIAGNOSIS — Z1283 Encounter for screening for malignant neoplasm of skin: Secondary | ICD-10-CM | POA: Diagnosis not present

## 2015-10-26 DIAGNOSIS — I5022 Chronic systolic (congestive) heart failure: Secondary | ICD-10-CM | POA: Diagnosis not present

## 2015-11-01 DIAGNOSIS — I459 Conduction disorder, unspecified: Secondary | ICD-10-CM | POA: Diagnosis not present

## 2015-11-01 DIAGNOSIS — I5022 Chronic systolic (congestive) heart failure: Secondary | ICD-10-CM | POA: Diagnosis not present

## 2015-11-01 DIAGNOSIS — I059 Rheumatic mitral valve disease, unspecified: Secondary | ICD-10-CM | POA: Diagnosis not present

## 2015-11-01 DIAGNOSIS — I447 Left bundle-branch block, unspecified: Secondary | ICD-10-CM | POA: Diagnosis not present

## 2015-11-01 DIAGNOSIS — I719 Aortic aneurysm of unspecified site, without rupture: Secondary | ICD-10-CM | POA: Diagnosis not present

## 2015-11-01 DIAGNOSIS — Z950 Presence of cardiac pacemaker: Secondary | ICD-10-CM | POA: Diagnosis not present

## 2015-11-01 DIAGNOSIS — I359 Nonrheumatic aortic valve disorder, unspecified: Secondary | ICD-10-CM | POA: Diagnosis not present

## 2015-11-05 DIAGNOSIS — B351 Tinea unguium: Secondary | ICD-10-CM | POA: Diagnosis not present

## 2015-11-05 DIAGNOSIS — I70202 Unspecified atherosclerosis of native arteries of extremities, left leg: Secondary | ICD-10-CM | POA: Diagnosis not present

## 2015-11-05 DIAGNOSIS — I70201 Unspecified atherosclerosis of native arteries of extremities, right leg: Secondary | ICD-10-CM | POA: Diagnosis not present

## 2015-11-05 DIAGNOSIS — L84 Corns and callosities: Secondary | ICD-10-CM | POA: Diagnosis not present

## 2015-11-27 DIAGNOSIS — L821 Other seborrheic keratosis: Secondary | ICD-10-CM | POA: Diagnosis not present

## 2015-11-27 DIAGNOSIS — L853 Xerosis cutis: Secondary | ICD-10-CM | POA: Diagnosis not present

## 2015-12-14 DIAGNOSIS — I714 Abdominal aortic aneurysm, without rupture: Secondary | ICD-10-CM | POA: Diagnosis not present

## 2015-12-19 DIAGNOSIS — I714 Abdominal aortic aneurysm, without rupture: Secondary | ICD-10-CM | POA: Diagnosis not present

## 2015-12-27 DIAGNOSIS — I714 Abdominal aortic aneurysm, without rupture: Secondary | ICD-10-CM | POA: Diagnosis not present

## 2016-01-01 DIAGNOSIS — I5022 Chronic systolic (congestive) heart failure: Secondary | ICD-10-CM | POA: Diagnosis not present

## 2016-01-01 DIAGNOSIS — I714 Abdominal aortic aneurysm, without rupture: Secondary | ICD-10-CM | POA: Diagnosis not present

## 2016-01-01 DIAGNOSIS — E785 Hyperlipidemia, unspecified: Secondary | ICD-10-CM | POA: Diagnosis not present

## 2016-01-01 DIAGNOSIS — Z950 Presence of cardiac pacemaker: Secondary | ICD-10-CM | POA: Diagnosis not present

## 2016-01-01 DIAGNOSIS — R32 Unspecified urinary incontinence: Secondary | ICD-10-CM | POA: Diagnosis not present

## 2016-01-04 DIAGNOSIS — I5022 Chronic systolic (congestive) heart failure: Secondary | ICD-10-CM | POA: Diagnosis not present

## 2016-01-04 DIAGNOSIS — I359 Nonrheumatic aortic valve disorder, unspecified: Secondary | ICD-10-CM | POA: Diagnosis not present

## 2016-01-04 DIAGNOSIS — I459 Conduction disorder, unspecified: Secondary | ICD-10-CM | POA: Diagnosis not present

## 2016-01-04 DIAGNOSIS — Z950 Presence of cardiac pacemaker: Secondary | ICD-10-CM | POA: Diagnosis not present

## 2016-01-04 DIAGNOSIS — I447 Left bundle-branch block, unspecified: Secondary | ICD-10-CM | POA: Diagnosis not present

## 2016-01-04 DIAGNOSIS — I059 Rheumatic mitral valve disease, unspecified: Secondary | ICD-10-CM | POA: Diagnosis not present

## 2016-01-04 DIAGNOSIS — I719 Aortic aneurysm of unspecified site, without rupture: Secondary | ICD-10-CM | POA: Diagnosis not present

## 2016-01-07 DIAGNOSIS — I70201 Unspecified atherosclerosis of native arteries of extremities, right leg: Secondary | ICD-10-CM | POA: Diagnosis not present

## 2016-01-07 DIAGNOSIS — I70202 Unspecified atherosclerosis of native arteries of extremities, left leg: Secondary | ICD-10-CM | POA: Diagnosis not present

## 2016-01-07 DIAGNOSIS — B351 Tinea unguium: Secondary | ICD-10-CM | POA: Diagnosis not present

## 2016-01-07 DIAGNOSIS — L84 Corns and callosities: Secondary | ICD-10-CM | POA: Diagnosis not present

## 2016-02-25 DIAGNOSIS — R32 Unspecified urinary incontinence: Secondary | ICD-10-CM | POA: Diagnosis not present

## 2016-02-25 DIAGNOSIS — I714 Abdominal aortic aneurysm, without rupture: Secondary | ICD-10-CM | POA: Diagnosis not present

## 2016-02-25 DIAGNOSIS — I5022 Chronic systolic (congestive) heart failure: Secondary | ICD-10-CM | POA: Diagnosis not present

## 2016-02-25 DIAGNOSIS — Z950 Presence of cardiac pacemaker: Secondary | ICD-10-CM | POA: Diagnosis not present

## 2016-02-25 DIAGNOSIS — E785 Hyperlipidemia, unspecified: Secondary | ICD-10-CM | POA: Diagnosis not present

## 2016-02-25 DIAGNOSIS — F419 Anxiety disorder, unspecified: Secondary | ICD-10-CM | POA: Diagnosis not present

## 2016-03-17 DIAGNOSIS — I70201 Unspecified atherosclerosis of native arteries of extremities, right leg: Secondary | ICD-10-CM | POA: Diagnosis not present

## 2016-03-17 DIAGNOSIS — B351 Tinea unguium: Secondary | ICD-10-CM | POA: Diagnosis not present

## 2016-03-17 DIAGNOSIS — L84 Corns and callosities: Secondary | ICD-10-CM | POA: Diagnosis not present

## 2016-03-17 DIAGNOSIS — I70202 Unspecified atherosclerosis of native arteries of extremities, left leg: Secondary | ICD-10-CM | POA: Diagnosis not present

## 2016-04-11 DIAGNOSIS — I729 Aneurysm of unspecified site: Secondary | ICD-10-CM | POA: Diagnosis not present

## 2016-04-11 DIAGNOSIS — E7800 Pure hypercholesterolemia, unspecified: Secondary | ICD-10-CM | POA: Diagnosis not present

## 2016-04-11 DIAGNOSIS — I5022 Chronic systolic (congestive) heart failure: Secondary | ICD-10-CM | POA: Diagnosis not present

## 2016-04-11 DIAGNOSIS — I359 Nonrheumatic aortic valve disorder, unspecified: Secondary | ICD-10-CM | POA: Diagnosis not present

## 2016-04-11 DIAGNOSIS — I059 Rheumatic mitral valve disease, unspecified: Secondary | ICD-10-CM | POA: Diagnosis not present

## 2016-04-11 DIAGNOSIS — I447 Left bundle-branch block, unspecified: Secondary | ICD-10-CM | POA: Diagnosis not present
